# Patient Record
Sex: Male | Born: 1950
Health system: Southern US, Community
[De-identification: ages and names within clinical notes are randomized; demographics above are authoritative.]

## PROBLEM LIST (undated history)

## (undated) DIAGNOSIS — L509 Urticaria, unspecified: Secondary | ICD-10-CM

## (undated) DIAGNOSIS — Z7901 Long term (current) use of anticoagulants: Secondary | ICD-10-CM

## (undated) DIAGNOSIS — Q2112 Patent foramen ovale: Secondary | ICD-10-CM

## (undated) DIAGNOSIS — G459 Transient cerebral ischemic attack, unspecified: Secondary | ICD-10-CM

## (undated) DIAGNOSIS — H539 Unspecified visual disturbance: Secondary | ICD-10-CM

## (undated) DIAGNOSIS — Q211 Atrial septal defect: Secondary | ICD-10-CM

## (undated) DIAGNOSIS — E7800 Pure hypercholesterolemia, unspecified: Secondary | ICD-10-CM

## (undated) DIAGNOSIS — I639 Cerebral infarction, unspecified: Secondary | ICD-10-CM

## (undated) DIAGNOSIS — T783XXA Angioneurotic edema, initial encounter: Secondary | ICD-10-CM

## (undated) DIAGNOSIS — S82302M Unspecified fracture of lower end of left tibia, subsequent encounter for open fracture type I or II with nonunion: Secondary | ICD-10-CM

## (undated) DIAGNOSIS — S43005A Unspecified dislocation of left shoulder joint, initial encounter: Secondary | ICD-10-CM

## (undated) DIAGNOSIS — G510 Bell's palsy: Secondary | ICD-10-CM

## (undated) HISTORY — PX: OTHER SURGICAL HISTORY: SHX169

## (undated) HISTORY — DX: Angioneurotic edema, initial encounter: T78.3XXA

## (undated) HISTORY — DX: Urticaria, unspecified: L50.9

## (undated) HISTORY — PX: FRACTURE SURGERY: SHX138

## (undated) HISTORY — DX: Unspecified visual disturbance: H53.9

---

## 2011-03-02 NOTE — H&P (Signed)
NAME:  Justin [name], Justin [name]      ACCOUNT NO.:  [id number]   MEDICAL RECORD NO.:  [id number]          PATIENT TYPE:  AMB   LOCATION:  DAY                           FACILITY:  [facility]   PHYSICIAN:  Dalia Heading, M.D.  DATE OF BIRTH:  Jan 26, 1951   DATE OF ADMISSION:  DATE OF DISCHARGE:  LH                              HISTORY & PHYSICAL   CHIEF COMPLAINT:  Need for screening colonoscopy.   HISTORY OF PRESENT ILLNESS:  Patient is a 60 year old white male who is  referred for endoscopic evaluation.  He needs colonoscopy for screening  purposes.  No abdominal pain, weight loss, nausea, vomiting, diarrhea,  constipation, melena, or hematochezia have been noted.  He has never had  a colonoscopy.  There is no family history of colon carcinoma.   PAST MEDICAL HISTORY:  Unremarkable.   PAST SURGICAL HISTORY:  Unremarkable.   CURRENT MEDICATIONS:  None.   ALLERGIES:  NO KNOWN DRUG ALLERGIES.   REVIEW OF SYSTEMS:  Noncontributory.   PHYSICAL EXAMINATION:  Patient is a well-developed, well-nourished white  male in no acute distress.  LUNGS:  Clear to auscultation with equal breath sounds bilaterally.  HEART:  Reveals a regular rate and rhythm without S3, S4, or murmurs.  ABDOMEN:  Soft, nontender, and nondistended.  No hepatosplenomegaly or  masses are noted.  RECTAL:  Deferred to the procedure.   IMPRESSION:  Need for screening colonoscopy.   PLAN:  The patient is scheduled for a colonoscopy on September 02, 2007.  The risks and benefits of the procedure including bleeding and  perforation were fully explained to the patient, gave informed consent.      Dalia Heading, M.D.  Electronically Signed     MAJ/MEDQ  D:  08/13/2007  T:  08/14/2007  Job:  [id number]   cc:   Dalia Heading, M.D.  Fax: [phone]   Patrica Duel, M.D.  Fax: [phone]

## 2011-03-02 NOTE — H&P (Signed)
NAME:  Justin [name], Justin [name]      ACCOUNT NO.:  [id number]   MEDICAL RECORD NO.:  [id number]         PATIENT TYPE:  AMB   LOCATION:  DAY                           FACILITY:  [facility]   PHYSICIAN:  Dalia Heading, M.D.  DATE OF BIRTH:  01/15/1951   DATE OF ADMISSION:  DATE OF DISCHARGE:  LH                              HISTORY & PHYSICAL   CHIEF COMPLAINT:  Need for screening colonoscopy.   HISTORY OF PRESENT ILLNESS:  Patient is a 60 year old white male who is  referred for endoscopic evaluation.  He needs colonoscopy for screening  purposes.  No abdominal pain, weight loss, nausea, vomiting, diarrhea,  constipation, melena, or hematochezia have been noted.  He has never had  a colonoscopy.  There is no family history of colon carcinoma.   PAST MEDICAL HISTORY:  Unremarkable.   PAST SURGICAL HISTORY:  Unremarkable.   CURRENT MEDICATIONS:  None.   ALLERGIES:  NO KNOWN DRUG ALLERGIES.   REVIEW OF SYSTEMS:  Noncontributory.   PHYSICAL EXAMINATION:  Patient is a well-developed, well-nourished white  male in no acute distress.  LUNGS:  Clear to auscultation with equal breath sounds bilaterally.  HEART:  Reveals a regular rate and rhythm without S3, S4, or murmurs.  ABDOMEN:  Soft, nontender, and nondistended.  No hepatosplenomegaly or  masses are noted.  RECTAL:  Deferred to the procedure.   IMPRESSION:  Need for screening colonoscopy.   PLAN:  The patient is scheduled for a colonoscopy on September 02, 2007.  The risks and benefits of the procedure including bleeding and  perforation were fully explained to the patient, gave informed consent.      Dalia Heading, M.D.  Electronically Signed     MAJ/MEDQ  D:  08/13/2007  T:  08/14/2007  Job:  [id number]   cc:   Dalia Heading, M.D.  Fax: [phone]   Patrica Duel, M.D.  Fax: [phone]

## 2012-10-15 DIAGNOSIS — I639 Cerebral infarction, unspecified: Secondary | ICD-10-CM

## 2012-10-15 HISTORY — DX: Cerebral infarction, unspecified: I63.9

## 2013-04-21 ENCOUNTER — Observation Stay (HOSPITAL_COMMUNITY): Payer: Managed Care, Other (non HMO)

## 2013-04-21 ENCOUNTER — Encounter (HOSPITAL_BASED_OUTPATIENT_CLINIC_OR_DEPARTMENT_OTHER): Payer: Self-pay | Admitting: *Deleted

## 2013-04-21 ENCOUNTER — Emergency Department (HOSPITAL_BASED_OUTPATIENT_CLINIC_OR_DEPARTMENT_OTHER): Payer: Managed Care, Other (non HMO)

## 2013-04-21 ENCOUNTER — Inpatient Hospital Stay (HOSPITAL_BASED_OUTPATIENT_CLINIC_OR_DEPARTMENT_OTHER)
Admission: EM | Admit: 2013-04-21 | Discharge: 2013-04-24 | DRG: 041 | Disposition: A | Discharge status: Home / Self Care | Payer: Managed Care, Other (non HMO) | Attending: Internal Medicine | Admitting: Internal Medicine

## 2013-04-21 DIAGNOSIS — R4701 Aphasia: Secondary | ICD-10-CM | POA: Diagnosis present

## 2013-04-21 DIAGNOSIS — G459 Transient cerebral ischemic attack, unspecified: Secondary | ICD-10-CM

## 2013-04-21 DIAGNOSIS — R29810 Facial weakness: Secondary | ICD-10-CM | POA: Diagnosis present

## 2013-04-21 DIAGNOSIS — G510 Bell's palsy: Secondary | ICD-10-CM

## 2013-04-21 DIAGNOSIS — Q2111 Secundum atrial septal defect: Secondary | ICD-10-CM

## 2013-04-21 DIAGNOSIS — Q211 Atrial septal defect: Secondary | ICD-10-CM

## 2013-04-21 DIAGNOSIS — I639 Cerebral infarction, unspecified: Secondary | ICD-10-CM

## 2013-04-21 DIAGNOSIS — I10 Essential (primary) hypertension: Secondary | ICD-10-CM | POA: Diagnosis present

## 2013-04-21 DIAGNOSIS — I6350 Cerebral infarction due to unspecified occlusion or stenosis of unspecified cerebral artery: Principal | ICD-10-CM | POA: Diagnosis present

## 2013-04-21 DIAGNOSIS — Z8673 Personal history of transient ischemic attack (TIA), and cerebral infarction without residual deficits: Secondary | ICD-10-CM | POA: Diagnosis present

## 2013-04-21 HISTORY — DX: Transient cerebral ischemic attack, unspecified: G45.9

## 2013-04-21 HISTORY — DX: Cerebral infarction, unspecified: I63.9

## 2013-04-21 HISTORY — DX: Bell's palsy: G51.0

## 2013-04-21 LAB — LIPID PANEL
Cholesterol: 177 mg/dL (ref 0–200)
HDL: 51 mg/dL (ref >39)
Triglycerides: 85 mg/dL (ref <150)
VLDL: 17 mg/dL (ref 0–40)

## 2013-04-21 LAB — URINALYSIS, ROUTINE W REFLEX MICROSCOPIC
Leukocytes, UA: NEGATIVE
Nitrite: NEGATIVE
Specific Gravity, Urine: 1.026 (ref 1.005–1.030)
pH: 6 (ref 5.0–8.0)

## 2013-04-21 LAB — CBC
HCT: 46.2 % (ref 39.0–52.0)
Hemoglobin: 15.8 g/dL (ref 13.0–17.0)
Hemoglobin: 14.9 g/dL (ref 13.0–17.0)
MCH: 29.4 pg (ref 26.0–34.0)
MCV: 85.8 fL (ref 78.0–100.0)
Platelets: 183 10*3/uL (ref 150–400)
RBC: 5.32 MIL/uL (ref 4.22–5.81)
RBC: 5.07 MIL/uL (ref 4.22–5.81)
RDW: 14.2 % (ref 11.5–15.5)
WBC: 8 10*3/uL (ref 4.0–10.5)
WBC: 10.1 10*3/uL (ref 4.0–10.5)

## 2013-04-21 LAB — RAPID URINE DRUG SCREEN, HOSP PERFORMED
Barbiturates: NOT DETECTED
Cocaine: NOT DETECTED

## 2013-04-21 LAB — DIFFERENTIAL
Basophils Absolute: 0 10*3/uL (ref 0.0–0.1)
Lymphocytes Relative: 27 % (ref 12–46)
Lymphs Abs: 2.1 10*3/uL (ref 0.7–4.0)
Monocytes Absolute: 0.6 10*3/uL (ref 0.1–1.0)
Monocytes Relative: 7 % (ref 3–12)
Neutro Abs: 5.2 10*3/uL (ref 1.7–7.7)

## 2013-04-21 LAB — COMPREHENSIVE METABOLIC PANEL
AST: 20 U/L (ref 0–37)
CO2: 24 mEq/L (ref 19–32)
Chloride: 104 mEq/L (ref 96–112)
Creatinine, Ser: 1 mg/dL (ref 0.50–1.35)
GFR calc non Af Amer: 80 mL/min — ABNORMAL LOW (ref >90)
Total Bilirubin: 0.5 mg/dL (ref 0.3–1.2)

## 2013-04-21 LAB — APTT: aPTT: 30 seconds (ref 24–37)

## 2013-04-21 LAB — GLUCOSE, CAPILLARY: Glucose-Capillary: 101 mg/dL — ABNORMAL HIGH (ref 70–99)

## 2013-04-21 LAB — TROPONIN I: Troponin I: <0.3 ng/mL (ref <0.30)

## 2013-04-21 MED ORDER — ASPIRIN 300 MG RE SUPP
300.0000 mg | Freq: Every day | RECTAL | Status: DC
  Filled 2013-04-21 (×4): qty 1

## 2013-04-21 MED ORDER — HYDRALAZINE HCL 20 MG/ML IJ SOLN: 10.0000 mg | Freq: Four times a day (QID) | INTRAMUSCULAR | Status: DC | PRN

## 2013-04-21 MED ORDER — ENOXAPARIN SODIUM 40 MG/0.4ML ~~LOC~~ SOLN
40.0000 mg | SUBCUTANEOUS | Status: DC
  Administered 2013-04-21 – 2013-04-23 (×3): 40 mg via SUBCUTANEOUS
  Filled 2013-04-21 (×4): qty 0.4

## 2013-04-21 MED ORDER — ASPIRIN 325 MG PO TABS
325.0000 mg | ORAL_TABLET | Freq: Every day | ORAL | Status: DC
  Administered 2013-04-21 – 2013-04-24 (×4): 325 mg via ORAL
  Filled 2013-04-21 (×4): qty 1

## 2013-04-21 MED ORDER — SENNOSIDES-DOCUSATE SODIUM 8.6-50 MG PO TABS
1.0000 | ORAL_TABLET | Freq: Every evening | ORAL | Status: DC | PRN
  Filled 2013-04-21: qty 1

## 2013-04-21 NOTE — ED Notes (Signed)
Pt. Speech is clear and concise with no reports of double vision and no c/o numbness or tingling in hands arms or head.  No c/o headache.

## 2013-04-21 NOTE — ED Notes (Addendum)
Report called to Morrie Sheldon RN at [facility]

## 2013-04-21 NOTE — ED Notes (Addendum)
Sudden onset of slurred speech while at work. Right sided face droop. Within a few minutes of assessment symptoms resolved. Hx of recent treatment for Bells Palsy with steroids.

## 2013-04-21 NOTE — H&P (Signed)
[facility] History and Physical  Laksh Hinners [place] WUJ:[id number] DOB: 1950/12/17 DOA: 04/21/2013  Referring physician:  PCP: No primary provider on file.   Chief Complaint: aphasia HPI:  62 year old male with a history of Bell's palsy, recently treated for a URI with prednisone and azithromycin, who presents to [facility] because of inability to speak lasting about 20 minutes. Prior to his episode the patient started experiencing numbness and tingling in his right arm. He denied any chest pain any shortness of breath. 1980s he had an episode of Bell's palsy, with an exacerbation 2 weeks ago. The patient is a nonsmoker, denies any prior history of CVA, denies any history of hypertension but is hypertensive today with systolic blood pressure the 170s.     Review of Systems: negative for the following  Constitutional: Denies fever, chills, diaphoresis, appetite change and fatigue.  HEENT: Denies photophobia, eye pain, redness, hearing loss, ear pain, congestion, sore throat, rhinorrhea, sneezing, mouth sores, trouble swallowing, neck pain, neck stiffness and tinnitus.  Respiratory: Denies SOB, DOE, cough, chest tightness, and wheezing.  Cardiovascular: Denies chest pain, palpitations and leg swelling.  Gastrointestinal: Denies nausea, vomiting, abdominal pain, diarrhea, constipation, blood in stool and abdominal distention.  Genitourinary: Denies dysuria, urgency, frequency, hematuria, flank pain and difficulty urinating.  Musculoskeletal: Denies myalgias, back pain, joint swelling, arthralgias and gait problem.  Skin: Denies pallor, rash and wound.  Neurological: Positive for facial asymmetry, speech difficulty, weakness and headaches. Negative for numbness Hematological: Denies adenopathy. Easy bruising, personal or family bleeding history  Psychiatric/Behavioral: Denies suicidal ideation, mood changes, confusion, nervousness, sleep disturbance and agitation       Past  Medical History  Diagnosis Date  . Bell's palsy      History reviewed. No pertinent past surgical history.    Social History:  reports that he has never smoked. He does not have any smokeless tobacco history on file. He reports that he does not drink alcohol or use illicit drugs.    No Known Allergies  No family history on file.   Prior to Admission medications   Not on File     Physical Exam: Filed Vitals:   04/21/13 1449 04/21/13 1500 04/21/13 1652  BP:  149/82 170/96  Pulse:  88 95  Temp:   98.2 F (36.8 C)  TempSrc:   Oral  Resp:  20 18  Height: 5\' 9"  (1.753 m)  5\' 9"  (1.753 m)  Weight: 83.915 kg (185 lb)  83.915 kg (185 lb)  SpO2:  100% 97%     Constitutional: Vital signs reviewed. Patient is a well-developed and well-nourished in no acute distress and cooperative with exam. Alert and oriented x3.  Head: Normocephalic and atraumatic  Ear: TM normal bilaterally  Mouth: no erythema or exudates, MMM  Eyes: PERRL, EOMI, conjunctivae normal, No scleral icterus.  Neck: Supple, Trachea midline normal ROM, No JVD, mass, thyromegaly, or carotid bruit present.  Cardiovascular: RRR, S1 normal, S2 normal, no MRG, pulses symmetric and intact bilaterally  Pulmonary/Chest: CTAB, no wheezes, rales, or rhonchi  Abdominal: Soft. Non-tender, non-distended, bowel sounds are normal, no masses, organomegaly, or guarding present.  GU: no CVA tenderness Musculoskeletal: No joint deformities, erythema, or stiffness, ROM full and no nontender Ext: no edema and no cyanosis, pulses palpable bilaterally (DP and PT)  Hematology: no cervical, inginal, or axillary adenopathy.  Neurological: A&O x3, Strenght is normal and symmetric bilaterally, cranial nerve II-XII are grossly intact, no focal motor deficit, sensory intact to light touch  bilaterally.  Skin: Warm, dry and intact. No rash, cyanosis, or clubbing.  Psychiatric: Normal mood and affect. speech and behavior is normal. Judgment and  thought content normal. Cognition and memory are normal.       Labs on Admission:    Basic Metabolic Panel:  Recent Labs Lab 04/21/13 1225  NA 142  K 3.8  CL 104  CO2 24  GLUCOSE 101*  BUN 15  CREATININE 1.00  CALCIUM 10.1   Liver Function Tests:  Recent Labs Lab 04/21/13 1225  AST 20  ALT 28  ALKPHOS 85  BILITOT 0.5  PROT 7.5  ALBUMIN 4.2   No results found for this basename: LIPASE, AMYLASE,  in the last 168 hours No results found for this basename: AMMONIA,  in the last 168 hours CBC:  Recent Labs Lab 04/21/13 1225  WBC 8.0  NEUTROABS 5.2  HGB 15.8  HCT 46.2  MCV 86.8  PLT 185   Cardiac Enzymes:  Recent Labs Lab 04/21/13 1225  TROPONINI <0.30    BNP (last 3 results) No results found for this basename: PROBNP,  in the last 8760 hours    CBG:  Recent Labs Lab 04/21/13 1225  GLUCAP 101*    Radiological Exams on Admission: Ct Head Wo Contrast  04/21/2013   *RADIOLOGY REPORT*  Clinical Data: Tingling right hand and difficulty with speech. Symptoms now resolved.  CT HEAD WITHOUT CONTRAST  Technique:  Contiguous axial images were obtained from the base of the skull through the vertex without contrast.  Comparison: None.  Findings: No intracranial hemorrhage.  No CT evidence of large acute infarct.  Subtle hypodensity within the posterior limb of the internal capsule slightly more notable on the left.  Small acute infarct not entirely excluded although this may be entirely within normal limits.  No intracranial mass lesion detected on this unenhanced exam.  No hydrocephalus.  Mastoid air cells, middle ear cavities and visualized sinuses are clear.  IMPRESSION: No intracranial hemorrhage.  No CT evidence of large acute infarct.  Subtle hypodensity within the posterior limb of the internal capsule slightly more notable on the left. This may represent a normal finding although a small acute infarct is not entirely excluded.  Results discussed with Dr.  Rubin Payor 04/21/2013 12:48 p.m.   Original Report Authenticated By: Lacy Duverney, M.D.    EKG: Independently reviewed.   Assessment/Plan   1. TIA Patient will be admitted for a full stroke workup MRI/MRA 2-D echo Hemoglobin A1c, lipid panel, Aspirin for antiplatelet therapy Patient does not take anything at home    Hypertension We'll start as needed hydralazine May need long-term anti-hypertensive medications    Code Status:   full Family Communication: bedside Disposition Plan: Observation, may discharge in the morning Time spent: 70 mins   [facility] Pager [phone]  If 7PM-7AM, please contact night-coverage [email or web address] Password [facility] 04/21/2013, 6:48 PM

## 2013-04-21 NOTE — ED Notes (Signed)
Pt. Is WNL neurologically at present time.

## 2013-04-21 NOTE — ED Notes (Signed)
Patient transported to CT 

## 2013-04-21 NOTE — ED Provider Notes (Signed)
History    CSN: [id number] Arrival date & time 04/21/13  1214  First MD Initiated Contact with Patient 04/21/13 1217     Chief Complaint  Patient presents with  . Cerebrovascular Accident   (Consider location/radiation/quality/duration/timing/severity/associated sxs/prior Treatment) Patient is a 62 y.o. male presenting with Acute Neurological Problem.  Cerebrovascular Accident Associated symptoms include headaches. Pertinent negatives include no chest pain, no abdominal pain and no shortness of breath.   patient presents with difficulty speaking and right-sided weakness with acute onset around 10 minutes prior to arrival. EMS was called. Patient reportedly was not able to speak and had only a few words come out. He also is having some difficulty walking and had some right-sided weakness. Upon arrival the symptoms were rapidly improving. During the exam he states that he was back to normal. He states that he does not talk previously. He has recently been treated for a left-sided Bell's palsy. He states he is a dull posterior headache. Past Medical History  Diagnosis Date  . Bell's palsy    History reviewed. No pertinent past surgical history. No family history on file. History  Substance Use Topics  . Smoking status: Never Smoker   . Smokeless tobacco: Not on file  . Alcohol Use: No    Review of Systems  Constitutional: Negative for activity change and appetite change.  HENT: Negative for neck stiffness.   Eyes: Negative for pain.  Respiratory: Negative for chest tightness and shortness of breath.   Cardiovascular: Negative for chest pain and leg swelling.  Gastrointestinal: Negative for nausea, vomiting, abdominal pain and diarrhea.  Genitourinary: Negative for flank pain.  Musculoskeletal: Negative for back pain.  Skin: Negative for rash.  Neurological: Positive for facial asymmetry, speech difficulty, weakness and headaches. Negative for numbness.  Psychiatric/Behavioral:  Negative for behavioral problems.    Allergies  Review of patient's allergies indicates no known allergies.  Home Medications  No current outpatient prescriptions on file. Ht 5\' 9"  (1.753 m)  Wt 185 lb (83.915 kg)  BMI 27.31 kg/m2 Physical Exam  Nursing note and vitals reviewed. Constitutional: He is oriented to person, place, and time. He appears well-developed and well-nourished.  HENT:  Head: Normocephalic and atraumatic.  Eyes: EOM are normal. Pupils are equal, round, and reactive to light.  Neck: Normal range of motion. Neck supple.  Cardiovascular: Normal rate, regular rhythm and normal heart sounds.   No murmur heard. Pulmonary/Chest: Effort normal and breath sounds normal.  Abdominal: Soft. Bowel sounds are normal. He exhibits no distension and no mass. There is no tenderness. There is no rebound and no guarding.  Musculoskeletal: Normal range of motion. He exhibits no edema.  Neurological: He is alert and oriented to person, place, and time. No cranial nerve deficit.  On initial exam patient had mild right-sided facial droop. He also is having some difficulty getting hours. During the exam he returned to a baseline of normal and had no neurologic deficits.  Skin: Skin is warm and dry.  Psychiatric: He has a normal mood and affect.    ED Course  Procedures (including critical care time) Labs Reviewed  COMPREHENSIVE METABOLIC PANEL - Abnormal; Notable for the following:    Glucose, Bld 101 (*)    GFR calc non Af Amer 80 (*)    All other components within normal limits  GLUCOSE, CAPILLARY - Abnormal; Notable for the following:    Glucose-Capillary 101 (*)    All other components within normal limits  ETHANOL  PROTIME-INR  APTT  CBC  DIFFERENTIAL  URINE RAPID DRUG SCREEN (HOSP PERFORMED)  URINALYSIS, ROUTINE W REFLEX MICROSCOPIC  TROPONIN I   Ct Head Wo Contrast  04/21/2013   *RADIOLOGY REPORT*  Clinical Data: Tingling right hand and difficulty with speech.  Symptoms now resolved.  CT HEAD WITHOUT CONTRAST  Technique:  Contiguous axial images were obtained from the base of the skull through the vertex without contrast.  Comparison: None.  Findings: No intracranial hemorrhage.  No CT evidence of large acute infarct.  Subtle hypodensity within the posterior limb of the internal capsule slightly more notable on the left.  Small acute infarct not entirely excluded although this may be entirely within normal limits.  No intracranial mass lesion detected on this unenhanced exam.  No hydrocephalus.  Mastoid air cells, middle ear cavities and visualized sinuses are clear.  IMPRESSION: No intracranial hemorrhage.  No CT evidence of large acute infarct.  Subtle hypodensity within the posterior limb of the internal capsule slightly more notable on the left. This may represent a normal finding although a small acute infarct is not entirely excluded.  Results discussed with Dr. Rubin Payor 04/21/2013 12:48 p.m.   Original Report Authenticated By: Lacy Duverney, M.D.   1. TIA (transient ischemic attack)     Date: 04/21/2013  Rate: 99  Rhythm: normal sinus rhythm  QRS Axis: normal  Intervals: normal  ST/T Wave abnormalities: nonspecific ST/T changes  Conduction Disutrbances:none  Narrative Interpretation:   Old EKG Reviewed: none available   MDM  Patient with acute onset of difficulty speaking and facial droop along with right-sided weakness. It has resolved since he arrived in the ED. Head CT is indeterminant. Liver is reassuring. He'll be transferred to [facility] for further evaluation treatment.   Juliet Rude. Rubin Payor, MD 04/21/13 1453

## 2013-04-22 DIAGNOSIS — I6350 Cerebral infarction due to unspecified occlusion or stenosis of unspecified cerebral artery: Principal | ICD-10-CM

## 2013-04-22 DIAGNOSIS — G510 Bell's palsy: Secondary | ICD-10-CM

## 2013-04-22 DIAGNOSIS — Z8673 Personal history of transient ischemic attack (TIA), and cerebral infarction without residual deficits: Secondary | ICD-10-CM | POA: Diagnosis present

## 2013-04-22 DIAGNOSIS — I369 Nonrheumatic tricuspid valve disorder, unspecified: Secondary | ICD-10-CM

## 2013-04-22 LAB — HEMOGLOBIN A1C
Hgb A1c MFr Bld: 5.6 % (ref <5.7)
Mean Plasma Glucose: 114 mg/dL (ref <117)

## 2013-04-22 LAB — LIPID PANEL
Cholesterol: 170 mg/dL (ref 0–200)
Total CHOL/HDL Ratio: 3.7 RATIO

## 2013-04-22 MED ORDER — SIMVASTATIN 10 MG PO TABS
10.0000 mg | ORAL_TABLET | Freq: Every day | ORAL | Status: DC
  Administered 2013-04-22 – 2013-04-24 (×3): 10 mg via ORAL
  Filled 2013-04-22 (×3): qty 1

## 2013-04-22 MED ORDER — ACETAMINOPHEN 325 MG PO TABS
650.0000 mg | ORAL_TABLET | ORAL | Status: DC | PRN
  Administered 2013-04-22 – 2013-04-24 (×2): 650 mg via ORAL
  Filled 2013-04-22 (×2): qty 2

## 2013-04-22 NOTE — Evaluation (Signed)
Occupational Therapy Evaluation Patient Details Name: Justin [name] MRN: [id number] DOB: 1951-09-08 Today's Date: 04/22/2013 Time: 4098-1191 OT Time Calculation (min): 15 min  OT Assessment / Plan / Recommendation History of present illness  Admitted s/p CVA. H/o Bell's palsy.    Clinical Impression   Pt admitted with RUE tingling and slurred speech. MRI positive for several foci of acute infarction measuring a centimeter or less in the left hemisphere along the margins of the left MCA territory.  Pt reports symptoms have resolved. Pt demo'd ADLs and functional mobility at Independent level. No further acute OT needs. Education completed. Will sign off.     OT Assessment  Patient does not need any further OT services    Follow Up Recommendations  No OT follow up    Barriers to Discharge      Equipment Recommendations  None recommended by OT    Recommendations for Other Services    Frequency       Precautions / Restrictions     Pertinent Vitals/Pain See vitals    ADL  Grooming: Performed;Wash/dry hands;Wash/dry face;Independent Where Assessed - Grooming: Unsupported standing Upper Body Bathing: Simulated;Independent Where Assessed - Upper Body Bathing: Unsupported sitting Lower Body Bathing: Simulated;Independent Where Assessed - Lower Body Bathing: Unsupported sit to stand Upper Body Dressing: Performed;Independent Where Assessed - Upper Body Dressing: Unsupported sit to stand Lower Body Dressing: Performed;Independent Where Assessed - Lower Body Dressing: Unsupported sit to stand Toilet Transfer: Simulated;Independent Toilet Transfer Method: Sit to Barista:  (bed) Tub/Shower Transfer: Simulated;Independent Tub/Shower Transfer Method: Science writer: Walk in Scientist, research (physical sciences) Used: Gait belt Transfers/Ambulation Related to ADLs: independent ADL Comments: Educated pt on stroke signs and symptoms, and pt verbalized  understanding. Pt reports RUE numbness/tingling has resolved and is now baselien. Has been using RUE for ADL tasks and for using phone.    OT Diagnosis:    OT Problem List:   OT Treatment Interventions:     OT Goals(Current goals can be found in the care plan section)    Visit Information  Last OT Received On: 04/22/13       Prior Functioning     Home Living Family/patient expects to be discharged to:: Private residence Living Arrangements: Spouse/significant other Available Help at Discharge: Family Type of Home: House Home Access: Stairs to enter Secretary/administrator of Steps: 2 Home Layout: One level Home Equipment: None  Lives With: Spouse Prior Function Level of Independence: Independent Communication Communication: No difficulties Dominant Hand: Right         Vision/Perception Vision - History Baseline Vision: Wears glasses all the time Patient Visual Report: No change from baseline   Cognition  Cognition Arousal/Alertness: Awake/alert Behavior During Therapy: WFL for tasks assessed/performed Overall Cognitive Status: Within Functional Limits for tasks assessed    Extremity/Trunk Assessment Upper Extremity Assessment Upper Extremity Assessment: Overall WFL for tasks assessed     Mobility Bed Mobility Bed Mobility: Supine to Sit;Sitting - Scoot to Edge of Bed Supine to Sit: 7: Independent Sitting - Scoot to Edge of Bed: 7: Independent Transfers Transfers: Sit to Stand;Stand to Sit Sit to Stand: 7: Independent;From bed Stand to Sit: 7: Independent;To bed     Exercise     Balance Balance Balance Assessed: Yes Static Standing Balance Static Standing - Balance Support: No upper extremity supported;During functional activity Static Standing - Level of Assistance: 7: Independent Dynamic Standing Balance Dynamic Standing - Balance Support: No upper extremity supported;During functional activity Dynamic Standing - Level  of Assistance: 7:  Independent Dynamic Standing - Balance Activities: Lateral lean/weight shifting;Forward lean/weight shifting;Reaching for objects   End of Session OT - End of Session Equipment Utilized During Treatment: Gait belt Activity Tolerance: Patient tolerated treatment well Patient left: in bed;with call bell/phone within reach Nurse Communication: Mobility status  GO   04/22/2013 Cipriano Mile OTR/L Pager [phone] Office [phone]   Cipriano Mile 04/22/2013, 3:57 PM

## 2013-04-22 NOTE — Consult Note (Signed)
Referring Physician: Blake Divine    Chief Complaint: stroke  HPI:                                                                                                                                         Justin [name] is an 62 y.o. male with history of recurrent Bell's palsy on the left. Patient was at work yesterday at 35 PM when he noted his right arm was numb and he could not express himself.  He was making sounds but could not mae intelligible conversation. This lasted for about 20 minutes and fully resolved. HE was brought to the hospital due to fear he may have had a stroke.  He was on no medications prior to hospitalization. Initial CT head was negative but follow up MRI showed Currently he is back to his baseline. Several foci of acute infarction measuring a centimeter or less in the left hemisphere along the margins of the left MCA territory.  MRA showed normal large and medium size vessels.    Date last known well: 7.8.14 Time last known well: 12:00 tPA Given: No: out of the window for treatment MRS: 0  Past Medical History  Diagnosis Date  . Bell's palsy 1980's; [id number]; 2000's; 12/2012; 03/2013    "multiple times" (04/21/2013)  . TIA (transient ischemic attack) 04/21/2013    Past Surgical History  Procedure Laterality Date  . No past surgeries      History reviewed. No pertinent family history. Social History:  reports that he has never smoked. He has never used smokeless tobacco. He reports that  drinks alcohol. He reports that he does not use illicit drugs.  Allergies: No Known Allergies  Medications:                                                                                                                           Scheduled: . aspirin  300 mg Rectal Daily   Or  . aspirin  325 mg Oral Daily  . enoxaparin (LOVENOX) injection  40 mg Subcutaneous Q24H    ROS:  History obtained from the patient  General ROS: negative for - chills, fatigue, fever, night sweats, weight gain or weight loss Psychological ROS: negative for - behavioral disorder, hallucinations, memory difficulties, mood swings or suicidal ideation Ophthalmic ROS: negative for - blurry vision, double vision, eye pain or loss of vision ENT ROS: negative for - epistaxis, nasal discharge, oral lesions, sore throat, tinnitus or vertigo Allergy and Immunology ROS: negative for - hives or itchy/watery eyes Hematological and Lymphatic ROS: negative for - bleeding problems, bruising or swollen lymph nodes Endocrine ROS: negative for - galactorrhea, hair pattern changes, polydipsia/polyuria or temperature intolerance Respiratory ROS: negative for - cough, hemoptysis, shortness of breath or wheezing Cardiovascular ROS: negative for - chest pain, dyspnea on exertion, edema or irregular heartbeat Gastrointestinal ROS: negative for - abdominal pain, diarrhea, hematemesis, nausea/vomiting or stool incontinence Genito-Urinary ROS: negative for - dysuria, hematuria, incontinence or urinary frequency/urgency Musculoskeletal ROS: negative for - joint swelling or muscular weakness Neurological ROS: as noted in HPI Dermatological ROS: negative for rash and skin lesion changes  Neurologic Examination:                                                                                                      Blood pressure 137/86, pulse 76, temperature 98.2 F (36.8 C), temperature source Oral, resp. rate 19, height 5\' 9"  (1.753 m), weight 83.915 kg (185 lb), SpO2 99.00%.  Mental Status: Alert, oriented, thought content appropriate.  Speech fluent without evidence of aphasia.  Able to follow 3 step commands without difficulty. Cranial Nerves: II: Discs flat bilaterally; Visual fields grossly normal, pupils equal, round, reactive to light and accommodation III,IV, VI: ptosis not present,  extra-ocular motions intact bilaterally V,VII: smile symmetric, slight NL fold decrease on the left (bell's residual), facial light touch sensation normal bilaterally VIII: hearing normal bilaterally IX,X: gag reflex present XI: bilateral shoulder shrug XII: midline tongue extension Motor: Right : Upper extremity   5/5    Left:     Upper extremity   5/5  Lower extremity   5/5     Lower extremity   5/5 Tone and bulk:normal tone throughout; no atrophy noted Sensory: Pinprick and light touch intact throughout, bilaterally Deep Tendon Reflexes:  Right: Upper Extremity   Left: Upper extremity   biceps (C-5 to C-6) 2/4   biceps (C-5 to C-6) 2/4 tricep (C7) 2/4    triceps (C7) 2/4 Brachioradialis (C6) 2/4  Brachioradialis (C6) 2/4  Lower Extremity Lower Extremity  quadriceps (L-2 to L-4) 2/4   quadriceps (L-2 to L-4) 2/4 Achilles (S1) 2/4   Achilles (S1) 2/4  Plantars: Right: downgoing   Left: downgoing Cerebellar: normal finger-to-nose,  normal heel-to-shin test CV: pulses palpable throughout    Results for orders placed during the hospital encounter of 04/21/13 (from the past 48 hour(s))  ETHANOL     Status: None   Collection Time    04/21/13 12:25 PM      Result Value Range   Alcohol, Ethyl (B) <11  0 - 11 mg/dL   Comment:  LOWEST DETECTABLE LIMIT FOR     SERUM ALCOHOL IS 11 mg/dL     FOR MEDICAL PURPOSES ONLY  PROTIME-INR     Status: None   Collection Time    04/21/13 12:25 PM      Result Value Range   Prothrombin Time 12.7  11.6 - 15.2 seconds   INR 0.97  0.00 - 1.49  APTT     Status: None   Collection Time    04/21/13 12:25 PM      Result Value Range   aPTT 30  24 - 37 seconds  CBC     Status: None   Collection Time    04/21/13 12:25 PM      Result Value Range   WBC 8.0  4.0 - 10.5 K/uL   RBC 5.32  4.22 - 5.81 MIL/uL   Hemoglobin 15.8  13.0 - 17.0 g/dL   HCT 16.1  09.6 - 04.5 %   MCV 86.8  78.0 - 100.0 fL   MCH 29.7  26.0 - 34.0 pg   MCHC 34.2   30.0 - 36.0 g/dL   RDW 40.9  81.1 - 91.4 %   Platelets 185  150 - 400 K/uL  DIFFERENTIAL     Status: None   Collection Time    04/21/13 12:25 PM      Result Value Range   Neutrophils Relative % 65  43 - 77 %   Neutro Abs 5.2  1.7 - 7.7 K/uL   Lymphocytes Relative 27  12 - 46 %   Lymphs Abs 2.1  0.7 - 4.0 K/uL   Monocytes Relative 7  3 - 12 %   Monocytes Absolute 0.6  0.1 - 1.0 K/uL   Eosinophils Relative 1  0 - 5 %   Eosinophils Absolute 0.0  0.0 - 0.7 K/uL   Basophils Relative 1  0 - 1 %   Basophils Absolute 0.0  0.0 - 0.1 K/uL  COMPREHENSIVE METABOLIC PANEL     Status: Abnormal   Collection Time    04/21/13 12:25 PM      Result Value Range   Sodium 142  135 - 145 mEq/L   Potassium 3.8  3.5 - 5.1 mEq/L   Chloride 104  96 - 112 mEq/L   CO2 24  19 - 32 mEq/L   Glucose, Bld 101 (*) 70 - 99 mg/dL   BUN 15  6 - 23 mg/dL   Creatinine, Ser 7.82  0.50 - 1.35 mg/dL   Calcium 95.6  8.4 - 21.3 mg/dL   Total Protein 7.5  6.0 - 8.3 g/dL   Albumin 4.2  3.5 - 5.2 g/dL   AST 20  0 - 37 U/L   ALT 28  0 - 53 U/L   Alkaline Phosphatase 85  39 - 117 U/L   Total Bilirubin 0.5  0.3 - 1.2 mg/dL   GFR calc non Af Amer 80 (*) >90 mL/min   GFR calc Af Amer >90  >90 mL/min   Comment:            The eGFR has been calculated     using the CKD EPI equation.     This calculation has not been     validated in all clinical     situations.     eGFR's persistently     <90 mL/min signify     possible Chronic Kidney Disease.  TROPONIN I     Status: None   Collection Time  04/21/13 12:25 PM      Result Value Range   Troponin I <0.30  <0.30 ng/mL   Comment:            Due to the release kinetics of cTnI,     a negative result within the first hours     of the onset of symptoms does not rule out     myocardial infarction with certainty.     If myocardial infarction is still suspected,     repeat the test at appropriate intervals.  GLUCOSE, CAPILLARY     Status: Abnormal   Collection Time     04/21/13 12:25 PM      Result Value Range   Glucose-Capillary 101 (*) 70 - 99 mg/dL   Comment 1 Notify RN    URINE RAPID DRUG SCREEN (HOSP PERFORMED)     Status: None   Collection Time    04/21/13  2:02 PM      Result Value Range   Opiates NONE DETECTED  NONE DETECTED   Cocaine NONE DETECTED  NONE DETECTED   Benzodiazepines NONE DETECTED  NONE DETECTED   Amphetamines NONE DETECTED  NONE DETECTED   Tetrahydrocannabinol NONE DETECTED  NONE DETECTED   Barbiturates NONE DETECTED  NONE DETECTED   Comment:            DRUG SCREEN FOR MEDICAL PURPOSES     ONLY.  IF CONFIRMATION IS NEEDED     FOR ANY PURPOSE, NOTIFY LAB     WITHIN 5 DAYS.                LOWEST DETECTABLE LIMITS     FOR URINE DRUG SCREEN     Drug Class       Cutoff (ng/mL)     Amphetamine      1000     Barbiturate      200     Benzodiazepine   200     Tricyclics       300     Opiates          300     Cocaine          300     THC              50  URINALYSIS, ROUTINE W REFLEX MICROSCOPIC     Status: None   Collection Time    04/21/13  2:02 PM      Result Value Range   Color, Urine YELLOW  YELLOW   APPearance CLEAR  CLEAR   Specific Gravity, Urine 1.026  1.005 - 1.030   pH 6.0  5.0 - 8.0   Glucose, UA NEGATIVE  NEGATIVE mg/dL   Hgb urine dipstick NEGATIVE  NEGATIVE   Bilirubin Urine NEGATIVE  NEGATIVE   Ketones, ur NEGATIVE  NEGATIVE mg/dL   Protein, ur NEGATIVE  NEGATIVE mg/dL   Urobilinogen, UA 0.2  0.0 - 1.0 mg/dL   Nitrite NEGATIVE  NEGATIVE   Leukocytes, UA NEGATIVE  NEGATIVE   Comment: MICROSCOPIC NOT DONE ON URINES WITH NEGATIVE PROTEIN, BLOOD, LEUKOCYTES, NITRITE, OR GLUCOSE <1000 mg/dL.  HEMOGLOBIN A1C     Status: None   Collection Time    04/21/13  7:30 PM      Result Value Range   Hemoglobin A1C 5.5  <5.7 %   Comment: (NOTE)  According to the ADA Clinical Practice Recommendations for 2011, when     HbA1c is used as a  screening test:      >=6.5%   Diagnostic of Diabetes Mellitus               (if abnormal result is confirmed)     5.7-6.4%   Increased risk of developing Diabetes Mellitus     References:Diagnosis and Classification of Diabetes Mellitus,Diabetes     Care,2011,34(Suppl 1):S62-S69 and Standards of Medical Care in             Diabetes - 2011,Diabetes Care,2011,34 (Suppl 1):S11-S61.   Mean Plasma Glucose 111  <117 mg/dL  LIPID PANEL     Status: Abnormal   Collection Time    04/21/13  7:30 PM      Result Value Range   Cholesterol 177  0 - 200 mg/dL   Triglycerides 85  <161 mg/dL   HDL 51  >09 mg/dL   Total CHOL/HDL Ratio 3.5     VLDL 17  0 - 40 mg/dL   LDL Cholesterol 604 (*) 0 - 99 mg/dL   Comment:            Total Cholesterol/HDL:CHD Risk     Coronary Heart Disease Risk Table                         Men   Women      1/2 Average Risk   3.4   3.3      Average Risk       5.0   4.4      2 X Average Risk   9.6   7.1      3 X Average Risk  23.4   11.0                Use the calculated Patient Ratio     above and the CHD Risk Table     to determine the patient's CHD Risk.                ATP III CLASSIFICATION (LDL):      <100     mg/dL   Optimal      540-981  mg/dL   Near or Above                        Optimal      130-159  mg/dL   Borderline      191-478  mg/dL   High      >295     mg/dL   Very High  CBC     Status: None   Collection Time    04/21/13  7:45 PM      Result Value Range   WBC 10.1  4.0 - 10.5 K/uL   RBC 5.07  4.22 - 5.81 MIL/uL   Hemoglobin 14.9  13.0 - 17.0 g/dL   HCT 62.1  30.8 - 65.7 %   MCV 85.8  78.0 - 100.0 fL   MCH 29.4  26.0 - 34.0 pg   MCHC 34.3  30.0 - 36.0 g/dL   RDW 84.6  96.2 - 95.2 %   Platelets 183  150 - 400 K/uL  LIPID PANEL     Status: Abnormal   Collection Time    04/22/13  4:45 AM      Result Value Range   Cholesterol 170  0 - 200 mg/dL  Triglycerides 107  <150 mg/dL   HDL 46  >96 mg/dL   Total CHOL/HDL Ratio 3.7     VLDL 21  0 - 40  mg/dL   LDL Cholesterol 045 (*) 0 - 99 mg/dL   Comment:            Total Cholesterol/HDL:CHD Risk     Coronary Heart Disease Risk Table                         Men   Women      1/2 Average Risk   3.4   3.3      Average Risk       5.0   4.4      2 X Average Risk   9.6   7.1      3 X Average Risk  23.4   11.0                Use the calculated Patient Ratio     above and the CHD Risk Table     to determine the patient's CHD Risk.                ATP III CLASSIFICATION (LDL):      <100     mg/dL   Optimal      409-811  mg/dL   Near or Above                        Optimal      130-159  mg/dL   Borderline      914-782  mg/dL   High      >956     mg/dL   Very High   Dg Chest 2 View  04/21/2013   *RADIOLOGY REPORT*  Clinical Data: Stroke  CHEST - 2 VIEW  Comparison: None  Findings: Heart size is normal.  No pleural effusion or edema.  No airspace consolidation identified.  Review of the visualized osseous structures is unremarkable.  IMPRESSION:  1.  No acute cardiopulmonary abnormalities.   Original Report Authenticated By: Signa Kell, M.D.   Ct Head Wo Contrast  04/21/2013   *RADIOLOGY REPORT*  Clinical Data: Tingling right hand and difficulty with speech. Symptoms now resolved.  CT HEAD WITHOUT CONTRAST  Technique:  Contiguous axial images were obtained from the base of the skull through the vertex without contrast.  Comparison: None.  Findings: No intracranial hemorrhage.  No CT evidence of large acute infarct.  Subtle hypodensity within the posterior limb of the internal capsule slightly more notable on the left.  Small acute infarct not entirely excluded although this may be entirely within normal limits.  No intracranial mass lesion detected on this unenhanced exam.  No hydrocephalus.  Mastoid air cells, middle ear cavities and visualized sinuses are clear.  IMPRESSION: No intracranial hemorrhage.  No CT evidence of large acute infarct.  Subtle hypodensity within the posterior limb of the  internal capsule slightly more notable on the left. This may represent a normal finding although a small acute infarct is not entirely excluded.  Results discussed with Dr. Rubin Payor 04/21/2013 12:48 p.m.   Original Report Authenticated By: Lacy Duverney, M.D.   Mr Brain Wo Contrast  04/22/2013   *RADIOLOGY REPORT*  Clinical Data:  Tingling of the right hand.  Speech disturbance.  MRI HEAD WITHOUT CONTRAST MRA HEAD WITHOUT CONTRAST  Technique:  Multiplanar, multiecho pulse sequences of the brain and surrounding  structures were obtained without intravenous contrast. Angiographic images of the head were obtained using MRA technique without contrast.  Comparison:  Head CT same day  MRI HEAD  Findings:  There are four or five sub-centimeter acute infarctions in the left hemisphere in the periphery of the brain at the parietal occipital junction and in the parietal region at the vertex.  These could be of micro embolic infarctions or could be watershed infarctions along the margins of the left MCA territory. No other acute infarction.  The brainstem and cerebellum are normal.  The cerebral hemispheres are otherwise normal without evidence of other old or acute infarction, mass lesion, hemorrhage, hydrocephalus or extra-axial collection.  No pituitary mass.  No inflammatory sinus disease.  No skull or skull base lesion.  IMPRESSION: Several foci of acute infarction measuring a centimeter or less in the left hemisphere along the margins of the left MCA territory. These could be due to micro embolic infarctions or could be watershed infarctions.  No evidence of swelling or hemorrhage.  The brain appears otherwise normal.  MRA HEAD  Findings: Both internal carotid arteries are widely patent into the brain.  The anterior and middle cerebral vessels are normal without proximal stenosis, aneurysm or vascular malformation.  Both vertebral arteries are patent with the left being dominant.  No basilar stenosis.  Posterior  circulation branch vessels are normal.  IMPRESSION: Normal intracranial MR angiography of the large and medium-sized vessels.  This appearance raises the likelihood of micro embolic infarctions over watershed infarctions.  Evaluation of the more proximal vasculature is suggested.   Original Report Authenticated By: Paulina Fusi, M.D.   Mr Mra Head/brain Wo Cm  04/22/2013   *RADIOLOGY REPORT*  Clinical Data:  Tingling of the right hand.  Speech disturbance.  MRI HEAD WITHOUT CONTRAST MRA HEAD WITHOUT CONTRAST  Technique:  Multiplanar, multiecho pulse sequences of the brain and surrounding structures were obtained without intravenous contrast. Angiographic images of the head were obtained using MRA technique without contrast.  Comparison:  Head CT same day  MRI HEAD  Findings:  There are four or five sub-centimeter acute infarctions in the left hemisphere in the periphery of the brain at the parietal occipital junction and in the parietal region at the vertex.  These could be of micro embolic infarctions or could be watershed infarctions along the margins of the left MCA territory. No other acute infarction.  The brainstem and cerebellum are normal.  The cerebral hemispheres are otherwise normal without evidence of other old or acute infarction, mass lesion, hemorrhage, hydrocephalus or extra-axial collection.  No pituitary mass.  No inflammatory sinus disease.  No skull or skull base lesion.  IMPRESSION: Several foci of acute infarction measuring a centimeter or less in the left hemisphere along the margins of the left MCA territory. These could be due to micro embolic infarctions or could be watershed infarctions.  No evidence of swelling or hemorrhage.  The brain appears otherwise normal.  MRA HEAD  Findings: Both internal carotid arteries are widely patent into the brain.  The anterior and middle cerebral vessels are normal without proximal stenosis, aneurysm or vascular malformation.  Both vertebral arteries are  patent with the left being dominant.  No basilar stenosis.  Posterior circulation branch vessels are normal.  IMPRESSION: Normal intracranial MR angiography of the large and medium-sized vessels.  This appearance raises the likelihood of micro embolic infarctions over watershed infarctions.  Evaluation of the more proximal vasculature is suggested.   Original Report Authenticated  By: Paulina Fusi, M.D.   Vascular Ultrasound  Carotid Duplex (Doppler) has been completed.  There is no obvious evidence of hemodynamically significant internal carotid artery stenosis >40%. Vertebral arteries are patent with antegrade flow.   Echo--pending   Assessment and plan discussed with with attending physician and they are in agreement.    Felicie Morn PA-C [facility] [phone]  04/22/2013, 11:54 AM  I have seen and evaluated the patient. I have reviewed the above note and made appropriate changes.    Assessment: 62 y.o. male with several acute left MCA infarcts. I suspect a larger embolic occlusion that then broke up and continued distally.  Patient currently showing no symptoms.  Thus far A1c 5.5, LDL 108, Dopplers showing no stenosis >40%.  2 D echo pending. If Echo is normal would benefit from TEE. Patient has been placed on ASA and would benefit from low dose statin.   Stroke Risk Factors - none  Plan: 1. Agree with starting statin therapy 2. Echocardiogram, if negative may need TEE.  3. Prophylactic therapy-Antiplatelet med: Aspirin - dose 325 mg daily 4. Risk factor modification 5. Telemetry monitoring 6. Frequent neuro checks   Ritta Slot, MD [facility] [phone]  If 7pm- 7am, please page neurology on call at [phone].

## 2013-04-22 NOTE — Evaluation (Signed)
Physical Therapy Evaluation Patient Details Name: Justin [name] MRN: [id number] DOB: [birth year] Today's Date: 04/22/2013 Time: 9629-5284 PT Time Calculation (min): 15 min  PT Assessment / Plan / Recommendation History of Present Illness  Admitted s/p CVA. H/o Bell's palsy  Clinical Impression  Pt admitted with RUE tingling and slurred speech. MRI positive for several foci of acute infarction measuring a centimeter or less in the left hemisphere along the margins of the left MCA territory. Pt reports symptoms have resolved. Pt independent with mobility and standardized balance assessment. No acute PT needs. PT will discharge, patient in agreement.     PT Assessment  Patent does not need any further PT services                         Pertinent Vitals/Pain NAD      Mobility  Bed Mobility Bed Mobility: Supine to Sit;Sitting - Scoot to Edge of Bed Supine to Sit: 7: Independent Sitting - Scoot to Edge of Bed: 7: Independent Transfers Transfers: Sit to Stand;Stand to Sit Sit to Stand: 7: Independent;From bed Stand to Sit: 7: Independent;To bed Ambulation/Gait Ambulation/Gait Assistance: 7: Independent Ambulation Distance (Feet): 200 Feet Assistive device: None Ambulation/Gait Assistance Details: WFL Gait Pattern: Within Functional Limits Gait velocity: WFL General Gait Details: WFL Modified Rankin (Stroke Patients Only) Pre-Morbid Rankin Score: No symptoms Modified Rankin: No symptoms      Visit Information  Last PT Received On: 04/22/13 Assistance Needed: +1 History of Present Illness: Admitted s/p CVA. H/o Bell's palsy       Prior Functioning  Home Living Family/patient expects to be discharged to:: Private residence Living Arrangements: Spouse/significant other Available Help at Discharge: Family Type of Home: House Home Access: Stairs to enter Secretary/administrator of Steps: 2 Home Layout: One level Home Equipment: None  Lives With: Spouse Prior  Function Level of Independence: Independent Communication Communication: No difficulties Dominant Hand: Right    Cognition  Cognition Arousal/Alertness: Awake/alert Behavior During Therapy: WFL for tasks assessed/performed Overall Cognitive Status: Within Functional Limits for tasks assessed    Extremity/Trunk Assessment Upper Extremity Assessment Upper Extremity Assessment: Overall WFL for tasks assessed   Balance Balance Balance Assessed: Yes Static Standing Balance Static Standing - Balance Support: No upper extremity supported;During functional activity Static Standing - Level of Assistance: 7: Independent Dynamic Standing Balance Dynamic Standing - Balance Support: No upper extremity supported;During functional activity Dynamic Standing - Level of Assistance: 7: Independent Dynamic Standing - Balance Activities: Lateral lean/weight shifting;Forward lean/weight shifting;Reaching for objects Dynamic Gait Index Level Surface: Normal Change in Gait Speed: Normal Gait with Horizontal Head Turns: Normal Gait with Vertical Head Turns: Normal Gait and Pivot Turn: Normal Step Over Obstacle: Normal Step Around Obstacles: Normal Steps: Normal Total Score: 24  End of Session PT - End of Session Equipment Utilized During Treatment: Gait belt Activity Tolerance: Patient tolerated treatment well Patient left: in bed Nurse Communication: Mobility status  GP     Fabio Asa 04/22/2013, 4:13 PM Charlotte Crumb, PT DPT  [phone]

## 2013-04-22 NOTE — Progress Notes (Signed)
*  PRELIMINARY RESULTS* Vascular Ultrasound Carotid Duplex (Doppler) has been completed. There is no obvious evidence of hemodynamically significant internal carotid artery stenosis >40%. Vertebral arteries are patent with antegrade flow.  04/22/2013 10:55 AM Gertie Fey, RVT, RDCS, RDMS

## 2013-04-22 NOTE — Progress Notes (Signed)
[facility] PROGRESS NOTE  Justin [name] YNW:[id number] DOB: January 12, 1951 DOA: 04/21/2013 PCP: No PCP Per Patient Brief HPI: 62 year old male with a history of Bell's palsy, recently treated for a URI with prednisone and azithromycin, who presents to [facility] because of inability to speak lasting about 20 minutes. Prior to his episode the patient started experiencing numbness and tingling in his right arm. He denied any chest pain any shortness of breath. 1980s he had an episode of Bell's palsy, with an exacerbation 2 weeks ago. The patient is a nonsmoker, denies any prior history of CVA, . He was admitted for evaluation of CVA.   Assessment/Plan: 1. Acute CVA:  - ASPIRIN 325 mg daily.  carotid dulplex did not show significant stenosis. Echo within normal limits. LDL is 109, hgba1c is 5.6.  2. Bell's Palsy: stable.  3. DVT prophylaxis.   Code Status: full code Family Communication: none present.  Disposition Plan: pending.    Consultants:  NEUROLOGY  Procedures:  MRI BRAIN  MRA HEAD AND NECK.  Antibiotics:  none  HPI/Subjective: No new complaints wants to go home.   Objective: Filed Vitals:   04/22/13 0002 04/22/13 0400 04/22/13 0800 04/22/13 1230  BP: 121/80 117/68 137/86 131/86  Pulse: 70 63 76 81  Temp: 98.1 F (36.7 C) 97.7 F (36.5 C) 98.2 F (36.8 C) 98.8 F (37.1 C)  TempSrc: Oral Oral Oral Oral  Resp: 18 18 19 17   Height:      Weight:      SpO2: 97% 98% 99% 97%    Intake/Output Summary (Last 24 hours) at 04/22/13 1541 Last data filed at 04/22/13 0830  Gross per 24 hour  Intake    360 ml  Output    400 ml  Net    -40 ml   Filed Weights   04/21/13 1449 04/21/13 1652  Weight: 83.915 kg (185 lb) 83.915 kg (185 lb)    Exam:   General:  Alert afebrile comfortable  Cardiovascular: s1s2 RRR  Respiratory: ctab  Abdomen: soft NT ND BS+  Musculoskeletal: no pedal edema  Data Reviewed: Basic Metabolic Panel:  Recent  Labs Lab 04/21/13 1225  NA 142  K 3.8  CL 104  CO2 24  GLUCOSE 101*  BUN 15  CREATININE 1.00  CALCIUM 10.1   Liver Function Tests:  Recent Labs Lab 04/21/13 1225  AST 20  ALT 28  ALKPHOS 85  BILITOT 0.5  PROT 7.5  ALBUMIN 4.2   No results found for this basename: LIPASE, AMYLASE,  in the last 168 hours No results found for this basename: AMMONIA,  in the last 168 hours CBC:  Recent Labs Lab 04/21/13 1225 04/21/13 1945  WBC 8.0 10.1  NEUTROABS 5.2  --   HGB 15.8 14.9  HCT 46.2 43.5  MCV 86.8 85.8  PLT 185 183   Cardiac Enzymes:  Recent Labs Lab 04/21/13 1225  TROPONINI <0.30   BNP (last 3 results) No results found for this basename: PROBNP,  in the last 8760 hours CBG:  Recent Labs Lab 04/21/13 1225  GLUCAP 101*    No results found for this or any previous visit (from the past 240 hour(s)).   Studies: Dg Chest 2 View  04/21/2013   *RADIOLOGY REPORT*  Clinical Data: Stroke  CHEST - 2 VIEW  Comparison: None  Findings: Heart size is normal.  No pleural effusion or edema.  No airspace consolidation identified.  Review of the visualized osseous structures is unremarkable.  IMPRESSION:  1.  No acute cardiopulmonary abnormalities.   Original Report Authenticated By: Signa Kell, M.D.   Ct Head Wo Contrast  04/21/2013   *RADIOLOGY REPORT*  Clinical Data: Tingling right hand and difficulty with speech. Symptoms now resolved.  CT HEAD WITHOUT CONTRAST  Technique:  Contiguous axial images were obtained from the base of the skull through the vertex without contrast.  Comparison: None.  Findings: No intracranial hemorrhage.  No CT evidence of large acute infarct.  Subtle hypodensity within the posterior limb of the internal capsule slightly more notable on the left.  Small acute infarct not entirely excluded although this may be entirely within normal limits.  No intracranial mass lesion detected on this unenhanced exam.  No hydrocephalus.  Mastoid air cells, middle  ear cavities and visualized sinuses are clear.  IMPRESSION: No intracranial hemorrhage.  No CT evidence of large acute infarct.  Subtle hypodensity within the posterior limb of the internal capsule slightly more notable on the left. This may represent a normal finding although a small acute infarct is not entirely excluded.  Results discussed with Dr. Rubin Payor 04/21/2013 12:48 p.m.   Original Report Authenticated By: Lacy Duverney, M.D.   Mr Brain Wo Contrast  04/22/2013   *RADIOLOGY REPORT*  Clinical Data:  Tingling of the right hand.  Speech disturbance.  MRI HEAD WITHOUT CONTRAST MRA HEAD WITHOUT CONTRAST  Technique:  Multiplanar, multiecho pulse sequences of the brain and surrounding structures were obtained without intravenous contrast. Angiographic images of the head were obtained using MRA technique without contrast.  Comparison:  Head CT same day  MRI HEAD  Findings:  There are four or five sub-centimeter acute infarctions in the left hemisphere in the periphery of the brain at the parietal occipital junction and in the parietal region at the vertex.  These could be of micro embolic infarctions or could be watershed infarctions along the margins of the left MCA territory. No other acute infarction.  The brainstem and cerebellum are normal.  The cerebral hemispheres are otherwise normal without evidence of other old or acute infarction, mass lesion, hemorrhage, hydrocephalus or extra-axial collection.  No pituitary mass.  No inflammatory sinus disease.  No skull or skull base lesion.  IMPRESSION: Several foci of acute infarction measuring a centimeter or less in the left hemisphere along the margins of the left MCA territory. These could be due to micro embolic infarctions or could be watershed infarctions.  No evidence of swelling or hemorrhage.  The brain appears otherwise normal.  MRA HEAD  Findings: Both internal carotid arteries are widely patent into the brain.  The anterior and middle cerebral  vessels are normal without proximal stenosis, aneurysm or vascular malformation.  Both vertebral arteries are patent with the left being dominant.  No basilar stenosis.  Posterior circulation branch vessels are normal.  IMPRESSION: Normal intracranial MR angiography of the large and medium-sized vessels.  This appearance raises the likelihood of micro embolic infarctions over watershed infarctions.  Evaluation of the more proximal vasculature is suggested.   Original Report Authenticated By: Paulina Fusi, M.D.   Mr Mra Head/brain Wo Cm  04/22/2013   *RADIOLOGY REPORT*  Clinical Data:  Tingling of the right hand.  Speech disturbance.  MRI HEAD WITHOUT CONTRAST MRA HEAD WITHOUT CONTRAST  Technique:  Multiplanar, multiecho pulse sequences of the brain and surrounding structures were obtained without intravenous contrast. Angiographic images of the head were obtained using MRA technique without contrast.  Comparison:  Head CT same day  MRI  HEAD  Findings:  There are four or five sub-centimeter acute infarctions in the left hemisphere in the periphery of the brain at the parietal occipital junction and in the parietal region at the vertex.  These could be of micro embolic infarctions or could be watershed infarctions along the margins of the left MCA territory. No other acute infarction.  The brainstem and cerebellum are normal.  The cerebral hemispheres are otherwise normal without evidence of other old or acute infarction, mass lesion, hemorrhage, hydrocephalus or extra-axial collection.  No pituitary mass.  No inflammatory sinus disease.  No skull or skull base lesion.  IMPRESSION: Several foci of acute infarction measuring a centimeter or less in the left hemisphere along the margins of the left MCA territory. These could be due to micro embolic infarctions or could be watershed infarctions.  No evidence of swelling or hemorrhage.  The brain appears otherwise normal.  MRA HEAD  Findings: Both internal carotid  arteries are widely patent into the brain.  The anterior and middle cerebral vessels are normal without proximal stenosis, aneurysm or vascular malformation.  Both vertebral arteries are patent with the left being dominant.  No basilar stenosis.  Posterior circulation branch vessels are normal.  IMPRESSION: Normal intracranial MR angiography of the large and medium-sized vessels.  This appearance raises the likelihood of micro embolic infarctions over watershed infarctions.  Evaluation of the more proximal vasculature is suggested.   Original Report Authenticated By: Paulina Fusi, M.D.    Scheduled Meds: . aspirin  300 mg Rectal Daily   Or  . aspirin  325 mg Oral Daily  . enoxaparin (LOVENOX) injection  40 mg Subcutaneous Q24H   Continuous Infusions:   Active Problems:   * No active hospital problems. *    Time spent: 25 minutes    [facility]  [facility] Pager [phone]. If 7PM-7AM, please contact night-coverage at [email or web address], password [facility] 04/22/2013, 3:41 PM  LOS: 1 day

## 2013-04-22 NOTE — Progress Notes (Signed)
Echo Lab  2D Echocardiogram completed.  Minna Dumire L Abram Sax, RDCS 04/22/2013 9:35 AM

## 2013-04-22 NOTE — Care Management Note (Signed)
    Page 1 of 1   04/24/2013     4:55:53 PM   CARE MANAGEMENT NOTE 04/24/2013  Patient:  Justin [name], Justin [name]   Account Number:  [id number]  Date Initiated:  04/22/2013  Documentation initiated by:  GRAVES-BIGELOW,BRENDA  Subjective/Objective Assessment:   Pt admitted for aphasia/numbness and tingling in his right arm. MRI:  revealed Several foci of acute infarction measuring a centimeter or less in  the left hemisphere along the margins of the left MCA territory. Pt is from home with wife.     Action/Plan:   CM will continue to monitor for disposition needs.   Anticipated DC Date:  04/25/2013   Anticipated DC Plan:  HOME/SELF CARE      DC Planning Services  CM consult  PCP issues      Choice offered to / List presented to:             Status of service:  In process, will continue to follow Medicare Important Message given?   (If response is "NO", the following Medicare IM given date fields will be blank) Date Medicare IM given:   Date Additional Medicare IM given:    Discharge Disposition:    Per UR Regulation:  Reviewed for med. necessity/level of care/duration of stay  If discussed at Long Length of Stay Meetings, dates discussed:    Comments:  04/24/13- 1650- Donn Pierini RN, BSN [phone] Referral received for PCP needs- pt has insurance- spoke with pt and wife at bedside and gave pt information for finding a PCP- including [facility] and Physician Referral line phone numbers. Pt to f/u with calling local MD office to see if they are excepting new pts.

## 2013-04-22 NOTE — Progress Notes (Signed)
UR Completed Shellye Zandi Graves-Bigelow, RN,BSN [phone]  

## 2013-04-22 NOTE — Evaluation (Signed)
Speech Language Pathology Evaluation Patient Details Name: JASIEL BELISLE MRN: [id number] DOB: November 07, 1950 Today's Date: 04/22/2013 Time: 6213-0865 SLP Time Calculation (min): 10 min  Problem List: There are no active problems to display for this patient.  Past Medical History:  Past Medical History  Diagnosis Date  . Bell's palsy 1980's; [id number]; 2000's; 12/2012; 03/2013    "multiple times" (04/21/2013)  . TIA (transient ischemic attack) 04/21/2013   Past Surgical History:  Past Surgical History  Procedure Laterality Date  . No past surgeries     HPI:  62 year old male with a history of Bell's palsy, recently treated for a URI with prednisone and azithromycin, who presents to [facility] because of inability to speak lasting about 20 minutes. Prior to his episode the patient started experiencing numbness and tingling in his right arm. 1980s he had an episode of Bell's palsy, with an exacerbation 2 weeks ago. The patient is a nonsmoker, denies any prior history of CVA, denies any history of hypertension but is hypertensive today with systolic blood pressure the 170s.  MRI revealed Several foci of acute infarction measuring a centimeter or less in the left hemisphere along the margins of the left MCA territory.   Assessment / Plan / Recommendation Clinical Impression  Speech-language-cognition is within functional limits.  He was able to recall results of all testing as well as information from PA-C this morning.  He has been working some from bed and not having difficulty reading  comprehending or texting on smart phone.  Cognition is WFL's.  No f/u ST needed.     SLP Assessment  Patient does not need any further Speech Lanaguage Pathology Services    Follow Up Recommendations  None    Frequency and Duration        Pertinent Vitals/Pain none       SLP Evaluation Prior Functioning  Cognitive/Linguistic Baseline: Within functional limits  Lives With: Spouse Vocation:  Full time employment   Cognition  Overall Cognitive Status: Within Functional Limits for tasks assessed Orientation Level: Oriented X4    Comprehension  Auditory Comprehension Overall Auditory Comprehension: Appears within functional limits for tasks assessed Visual Recognition/Discrimination Discrimination: Not tested Reading Comprehension Reading Status: Not tested (pr. reported no deficits)    Expression Expression Primary Mode of Expression: Verbal Verbal Expression Overall Verbal Expression: Appears within functional limits for tasks assessed Written Expression Written Expression: Not tested   Oral / Motor Oral Motor/Sensory Function Overall Oral Motor/Sensory Function: Impaired at baseline (slight right labial droop sp Bell's palst in 80's) Motor Speech Overall Motor Speech: Appears within functional limits for tasks assessed Intelligibility: Intelligible Motor Planning: Witnin functional limits   GO Functional Assessment Tool Used: clinical judgement Functional Limitations: Spoken language expressive Spoken Language Expression Current Status [phone]): 0 percent impaired, limited or restricted Spoken Language Expression Goal Status (G2952): 0 percent impaired, limited or restricted Spoken Language Expression Discharge Status [phone]): 0 percent impaired, limited or restricted   Royce Macadamia M.Ed [employer] [phone]  04/22/2013

## 2013-04-22 NOTE — Progress Notes (Signed)
Physical Therapy Discharge Patient Details Name: TYREASE VANDEBERG MRN: [id number] DOB: Feb 24, 1951 Today's Date: 04/22/2013 Time: 6962-9528 PT Time Calculation (min): 15 min  Patient discharged from PT services secondary to goals met and no further PT needs identified.  Please see latest therapy progress note for current level of functioning and progress toward goals.    Progress and discharge plan discussed with patient and/or caregiver: Patient/Caregiver agrees with plan  GP     Fabio Asa 04/22/2013, 4:15 PM

## 2013-04-23 DIAGNOSIS — I6350 Cerebral infarction due to unspecified occlusion or stenosis of unspecified cerebral artery: Secondary | ICD-10-CM

## 2013-04-23 MED ORDER — SODIUM CHLORIDE 0.9 % IV SOLN: INTRAVENOUS | Status: DC

## 2013-04-23 MED ORDER — CHLORHEXIDINE GLUCONATE 4 % EX LIQD
60.0000 mL | Freq: Once | CUTANEOUS | Status: AC
  Administered 2013-04-24: 4 via TOPICAL
  Filled 2013-04-23: qty 60

## 2013-04-23 MED ORDER — CEFAZOLIN SODIUM-DEXTROSE 2-3 GM-% IV SOLR: 2.0000 g | INTRAVENOUS | Status: DC

## 2013-04-23 NOTE — Progress Notes (Signed)
[facility] PROGRESS NOTE  Justin [name] ZOX:[id number] DOB: 1951-10-06 DOA: 04/21/2013 PCP: No PCP Per Patient Brief HPI: 62 year old male with a history of Bell's palsy, recently treated for a URI with prednisone and azithromycin, who presents to [facility] because of inability to speak lasting about 20 minutes. Prior to his episode the patient started experiencing numbness and tingling in his right arm. He denied any chest pain any shortness of breath. 1980s he had an episode of Bell's palsy, with an exacerbation 2 weeks ago. The patient is a nonsmoker, denies any prior history of CVA, . He was admitted for evaluation of CVA.   Assessment/Plan: 1. Acute CVA:  - ASPIRIN 325 mg daily.  carotid dulplex did not show significant stenosis. Echo showed good LVEF of 55% no wall motion abnormalities, and grade 1 diastolic dysfunction, neurology recommending TEE.   LDL is 109, hgba1c is 5.6.  2. Bell's Palsy: stable.  3. DVT prophylaxis.   Code Status: full code Family Communication: none present.  Disposition Plan: pending.    Consultants:  NEUROLOGY  Procedures:  MRI BRAIN  MRA HEAD AND NECK.  Antibiotics:  none  HPI/Subjective: No new complaints wants to go home.   Objective: Filed Vitals:   04/22/13 2000 04/23/13 0000 04/23/13 0400 04/23/13 0927  BP: 128/76 101/65 104/63 120/80  Pulse: 74 67 61 74  Temp: 97.9 F (36.6 C) 98 F (36.7 C) 98.5 F (36.9 C) 98.2 F (36.8 C)  TempSrc: Oral Oral Oral Oral  Resp: 18 18  19   Height:      Weight:   76.068 kg (167 lb 11.2 oz)   SpO2: 97% 96% 98% 96%    Intake/Output Summary (Last 24 hours) at 04/23/13 1229 Last data filed at 04/23/13 0837  Gross per 24 hour  Intake    360 ml  Output      0 ml  Net    360 ml   Filed Weights   04/21/13 1449 04/21/13 1652 04/23/13 0400  Weight: 83.915 kg (185 lb) 83.915 kg (185 lb) 76.068 kg (167 lb 11.2 oz)    Exam:   General:  Alert afebrile  comfortable  Cardiovascular: s1s2 RRR  Respiratory: ctab  Abdomen: soft NT ND BS+  Musculoskeletal: no pedal edema  Data Reviewed: Basic Metabolic Panel:  Recent Labs Lab 04/21/13 1225  NA 142  K 3.8  CL 104  CO2 24  GLUCOSE 101*  BUN 15  CREATININE 1.00  CALCIUM 10.1   Liver Function Tests:  Recent Labs Lab 04/21/13 1225  AST 20  ALT 28  ALKPHOS 85  BILITOT 0.5  PROT 7.5  ALBUMIN 4.2   No results found for this basename: LIPASE, AMYLASE,  in the last 168 hours No results found for this basename: AMMONIA,  in the last 168 hours CBC:  Recent Labs Lab 04/21/13 1225 04/21/13 1945  WBC 8.0 10.1  NEUTROABS 5.2  --   HGB 15.8 14.9  HCT 46.2 43.5  MCV 86.8 85.8  PLT 185 183   Cardiac Enzymes:  Recent Labs Lab 04/21/13 1225  TROPONINI <0.30   BNP (last 3 results) No results found for this basename: PROBNP,  in the last 8760 hours CBG:  Recent Labs Lab 04/21/13 1225  GLUCAP 101*    No results found for this or any previous visit (from the past 240 hour(s)).   Studies: Dg Chest 2 View  04/21/2013   *RADIOLOGY REPORT*  Clinical Data: Stroke  CHEST - 2  VIEW  Comparison: None  Findings: Heart size is normal.  No pleural effusion or edema.  No airspace consolidation identified.  Review of the visualized osseous structures is unremarkable.  IMPRESSION:  1.  No acute cardiopulmonary abnormalities.   Original Report Authenticated By: Signa Kell, M.D.   Ct Head Wo Contrast  04/21/2013   *RADIOLOGY REPORT*  Clinical Data: Tingling right hand and difficulty with speech. Symptoms now resolved.  CT HEAD WITHOUT CONTRAST  Technique:  Contiguous axial images were obtained from the base of the skull through the vertex without contrast.  Comparison: None.  Findings: No intracranial hemorrhage.  No CT evidence of large acute infarct.  Subtle hypodensity within the posterior limb of the internal capsule slightly more notable on the left.  Small acute infarct not  entirely excluded although this may be entirely within normal limits.  No intracranial mass lesion detected on this unenhanced exam.  No hydrocephalus.  Mastoid air cells, middle ear cavities and visualized sinuses are clear.  IMPRESSION: No intracranial hemorrhage.  No CT evidence of large acute infarct.  Subtle hypodensity within the posterior limb of the internal capsule slightly more notable on the left. This may represent a normal finding although a small acute infarct is not entirely excluded.  Results discussed with Dr. Rubin Payor 04/21/2013 12:48 p.m.   Original Report Authenticated By: Lacy Duverney, M.D.   Mr Brain Wo Contrast  04/22/2013   *RADIOLOGY REPORT*  Clinical Data:  Tingling of the right hand.  Speech disturbance.  MRI HEAD WITHOUT CONTRAST MRA HEAD WITHOUT CONTRAST  Technique:  Multiplanar, multiecho pulse sequences of the brain and surrounding structures were obtained without intravenous contrast. Angiographic images of the head were obtained using MRA technique without contrast.  Comparison:  Head CT same day  MRI HEAD  Findings:  There are four or five sub-centimeter acute infarctions in the left hemisphere in the periphery of the brain at the parietal occipital junction and in the parietal region at the vertex.  These could be of micro embolic infarctions or could be watershed infarctions along the margins of the left MCA territory. No other acute infarction.  The brainstem and cerebellum are normal.  The cerebral hemispheres are otherwise normal without evidence of other old or acute infarction, mass lesion, hemorrhage, hydrocephalus or extra-axial collection.  No pituitary mass.  No inflammatory sinus disease.  No skull or skull base lesion.  IMPRESSION: Several foci of acute infarction measuring a centimeter or less in the left hemisphere along the margins of the left MCA territory. These could be due to micro embolic infarctions or could be watershed infarctions.  No evidence of swelling  or hemorrhage.  The brain appears otherwise normal.  MRA HEAD  Findings: Both internal carotid arteries are widely patent into the brain.  The anterior and middle cerebral vessels are normal without proximal stenosis, aneurysm or vascular malformation.  Both vertebral arteries are patent with the left being dominant.  No basilar stenosis.  Posterior circulation branch vessels are normal.  IMPRESSION: Normal intracranial MR angiography of the large and medium-sized vessels.  This appearance raises the likelihood of micro embolic infarctions over watershed infarctions.  Evaluation of the more proximal vasculature is suggested.   Original Report Authenticated By: Paulina Fusi, M.D.   Mr Mra Head/brain Wo Cm  04/22/2013   *RADIOLOGY REPORT*  Clinical Data:  Tingling of the right hand.  Speech disturbance.  MRI HEAD WITHOUT CONTRAST MRA HEAD WITHOUT CONTRAST  Technique:  Multiplanar, multiecho pulse sequences of  the brain and surrounding structures were obtained without intravenous contrast. Angiographic images of the head were obtained using MRA technique without contrast.  Comparison:  Head CT same day  MRI HEAD  Findings:  There are four or five sub-centimeter acute infarctions in the left hemisphere in the periphery of the brain at the parietal occipital junction and in the parietal region at the vertex.  These could be of micro embolic infarctions or could be watershed infarctions along the margins of the left MCA territory. No other acute infarction.  The brainstem and cerebellum are normal.  The cerebral hemispheres are otherwise normal without evidence of other old or acute infarction, mass lesion, hemorrhage, hydrocephalus or extra-axial collection.  No pituitary mass.  No inflammatory sinus disease.  No skull or skull base lesion.  IMPRESSION: Several foci of acute infarction measuring a centimeter or less in the left hemisphere along the margins of the left MCA territory. These could be due to micro embolic  infarctions or could be watershed infarctions.  No evidence of swelling or hemorrhage.  The brain appears otherwise normal.  MRA HEAD  Findings: Both internal carotid arteries are widely patent into the brain.  The anterior and middle cerebral vessels are normal without proximal stenosis, aneurysm or vascular malformation.  Both vertebral arteries are patent with the left being dominant.  No basilar stenosis.  Posterior circulation branch vessels are normal.  IMPRESSION: Normal intracranial MR angiography of the large and medium-sized vessels.  This appearance raises the likelihood of micro embolic infarctions over watershed infarctions.  Evaluation of the more proximal vasculature is suggested.   Original Report Authenticated By: Paulina Fusi, M.D.    Scheduled Meds: . aspirin  300 mg Rectal Daily   Or  . aspirin  325 mg Oral Daily  . enoxaparin (LOVENOX) injection  40 mg Subcutaneous Q24H  . simvastatin  10 mg Oral q1800   Continuous Infusions:   Active Problems:   CVA (cerebral infarction)   Bell's palsy    Time spent: 25 minutes    Sequoyah Ramone  [facility] Pager [phone]. If 7PM-7AM, please contact night-coverage at [email or web address], password [facility] 04/23/2013, 12:29 PM  LOS: 2 days

## 2013-04-23 NOTE — Progress Notes (Signed)
Stroke Team Progress Note  HISTORY Justin [name] is an 62 y.o. male with history of recurrent Bell's palsy on the left. Patient was at work 04/21/2013 at 12 PM when he noted his right arm was numb and he could not express himself. He was making sounds but could not make intelligible conversation. This lasted for about 20 minutes and fully resolved. HE was brought to the hospital due to fear he may have had a stroke. He was on no medications prior to hospitalization. Initial CT head was negative but follow up MRI showed Currently he is back to his baseline. Several foci of acute infarction measuring a centimeter or less in the left hemisphere along the margins of the left MCA territory. MRA showed normal large and medium size vessels. Patient was not a TPA candidate secondary to symptom resolution. He was admitted for further evaluation and treatment.  SUBJECTIVE His wife is at the bedside.  Overall he feels his condition is completely resolved.   OBJECTIVE Most recent Vital Signs: Filed Vitals:   04/22/13 1230 04/22/13 2000 04/23/13 0000 04/23/13 0400  BP: 131/86 128/76 101/65 104/63  Pulse: 81 74 67 61  Temp: 98.8 F (37.1 C) 97.9 F (36.6 C) 98 F (36.7 C) 98.5 F (36.9 C)  TempSrc: Oral Oral Oral Oral  Resp: 17 18 18   Height:      Weight:    76.068 kg (167 lb 11.2 oz)  SpO2: 97% 97% 96% 98%   CBG (last 3)   Recent Labs  04/21/13 1225  GLUCAP 101*    IV Fluid Intake:     MEDICATIONS  . aspirin  300 mg Rectal Daily   Or  . aspirin  325 mg Oral Daily  . enoxaparin (LOVENOX) injection  40 mg Subcutaneous Q24H  . simvastatin  10 mg Oral q1800   PRN:  acetaminophen, hydrALAZINE, senna-docusate  Diet:  General thin liquids Activity:  Bedrest DVT Prophylaxis:  Lovenox 40 mg sq daily   CLINICALLY SIGNIFICANT STUDIES Basic Metabolic Panel:  Recent Labs Lab 04/21/13 1225  NA 142  K 3.8  CL 104  CO2 24  GLUCOSE 101*  BUN 15  CREATININE 1.00  CALCIUM 10.1   Liver  Function Tests:  Recent Labs Lab 04/21/13 1225  AST 20  ALT 28  ALKPHOS 85  BILITOT 0.5  PROT 7.5  ALBUMIN 4.2   CBC:  Recent Labs Lab 04/21/13 1225 04/21/13 1945  WBC 8.0 10.1  NEUTROABS 5.2  --   HGB 15.8 14.9  HCT 46.2 43.5  MCV 86.8 85.8  PLT 185 183   Coagulation:  Recent Labs Lab 04/21/13 1225  LABPROT 12.7  INR 0.97   Cardiac Enzymes:  Recent Labs Lab 04/21/13 1225  TROPONINI <0.30   Urinalysis:  Recent Labs Lab 04/21/13 1402  COLORURINE YELLOW  LABSPEC 1.026  PHURINE 6.0  GLUCOSEU NEGATIVE  HGBUR NEGATIVE  BILIRUBINUR NEGATIVE  KETONESUR NEGATIVE  PROTEINUR NEGATIVE  UROBILINOGEN 0.2  NITRITE NEGATIVE  LEUKOCYTESUR NEGATIVE   Lipid Panel    Component Value Date/Time   CHOL 170 04/22/2013 0445   TRIG 107 04/22/2013 0445   HDL 46 04/22/2013 0445   CHOLHDL 3.7 04/22/2013 0445   VLDL 21 04/22/2013 0445   LDLCALC 103* 04/22/2013 0445   HgbA1C  Lab Results  Component Value Date   HGBA1C 5.6 04/22/2013    Urine Drug Screen:     Component Value Date/Time   LABOPIA NONE DETECTED 04/21/2013 1402   COCAINSCRNUR NONE   DETECTED 04/21/2013 1402   LABBENZ NONE DETECTED 04/21/2013 1402   AMPHETMU NONE DETECTED 04/21/2013 1402   THCU NONE DETECTED 04/21/2013 1402   LABBARB NONE DETECTED 04/21/2013 1402    Alcohol Level:  Recent Labs Lab 04/21/13 1225  ETH <11   CT of the brain  04/21/2013    No intracranial hemorrhage.  No CT evidence of large acute infarct.  Subtle hypodensity within the posterior limb of the internal capsule slightly more notable on the left. This may represent a normal finding although a small acute infarct is not entirely excluded.    MRI of the brain  04/22/2013    Several foci of acute infarction measuring a centimeter or less in the left hemisphere along the margins of the left MCA territory. These could be due to micro embolic infarctions or could be watershed infarctions.  No evidence of swelling or hemorrhage.  The brain appears otherwise  normal.  MRA of the brain  04/22/2013   Normal intracranial MR angiography of the large and medium-sized vessels.  This appearance raises the likelihood of micro embolic infarctions over watershed infarctions.  Evaluation of the more proximal vasculature is suggested.  2D Echocardiogram  EF 55-60% with no source of embolus.   Carotid Doppler  There is no obvious evidence of hemodynamically significant internal carotid artery stenosis >40%. Vertebral arteries are patent with antegrade flow.  CXR  04/21/2013   No acute cardiopulmonary abnormalities.    EKG   normal sinus rhythm.   Therapy Recommendations no needs  Physical Exam   Frail middle-aged Caucasian pleasant male currently not in distress.Awake alert. Afebrile. Head is nontraumatic. Neck is supple without bruit. Hearing is normal. Cardiac exam no murmur or gallop. Lungs are clear to auscultation. Distal pulses are well felt. Neurological Exam ;  Awake  Alert oriented x 3. Normal speech and language.eye movements full without nystagmus.fundi were not visualized. Vision acuity and fields appear normal. Hearing is normal. Palatal movements are normal. Face symmetric. Tongue midline. Normal strength, tone, reflexes and coordination. Normal sensation. Gait deferred. ASSESSMENT Mr. Justin [name] is a 62 y.o. male presenting with transient right arm numbness and inability to express himself.  Imaging confirms a left MCA tiny embolic infarcts. Infarct felt to be  embolic secondary to unknown etiology, suspect atrial fibrillation.  On No anti-thrombotics prior to admission. Now on aspirin 325 mg orally every day for secondary stroke prevention. Patient with no resultant neuro- deficits. Work up underway.   History of TIA  History of Bell's palsy x5  Hospital day # 2  TREATMENT/PLAN  Continue aspirin 325 mg orally every day for secondary stroke prevention. TEE to look for embolic source. Arranged with [facility] for tomorrow.  If  positive for PFO (patent foramen ovale), check bilateral lower extremity venous dopplers to rule out DVT as possible source of stroke.  If TEE negative, [place] cardiologist will place implantable loop recorder to evaluate for atrial fibrillation. This has been explained to patient/family and they are agreeable. I have personally discussed with RN and written order for consent. There is a downtime order form  for TEE placement located in the shadow chart as an electronic order in [facility] is not currently available. Dr. Sethi has discussed plan with Dr. Akula and Dr. Allred  SHARON BIBY, MSN, RN, ANVP-BC, ANP-BC, GNP-BC [facility] Pager: [phone] 04/23/2013 9:22 AM  I have personally obtained a history, examined the patient, evaluated imaging results, and formulated the assessment and plan of   care. I agree with the above. Pramod Sethi, MD 

## 2013-04-23 NOTE — Consult Note (Signed)
ELECTROPHYSIOLOGY CONSULT NOTE  Patient ID: Justin [name] MRN: [id number], DOB/AGE: [birth year]   Admit date: 04/21/2013 Date of Consult: 04/23/2013  Primary Physician: None Primary Cardiologist: New to [place] Reason for Consultation: Cryptogenic stroke; recommendations regarding Implantable Loop Recorder  History of Present Illness Justin [name] was admitted on 04/21/2013 with acute left MCA CVA. He has been monitored on telemetry which has demonstrated no arrhythmias. No cause has been identified. Stroke work-up is to be completed tomorrow with a TEE. EP has been asked to evaluate for placement of an implantable loop recorder to monitor for atrial fibrillation.  Past Medical History Past Medical History  Diagnosis Date  . Bell's palsy 1980's; [id number]; 2000's; 12/2012; 03/2013    "multiple times" (04/21/2013)  . TIA (transient ischemic attack) 04/21/2013    Past Surgical History Past Surgical History  Procedure Laterality Date  . No past surgeries      Allergies/Intolerances No Known Allergies Inpatient Medications . aspirin  300 mg Rectal Daily   Or  . aspirin  325 mg Oral Daily  . enoxaparin (LOVENOX) injection  40 mg Subcutaneous Q24H  . simvastatin  10 mg Oral q1800   Social History History   Social History  . Marital Status: Married    Spouse Name: N/A    Number of Children: N/A  . Years of Education: N/A   Occupational History  . Not on file.   Social History Main Topics  . Smoking status: Never Smoker   . Smokeless tobacco: Never Used  . Alcohol Use: Yes     Comment: 03/22/2013 "glass of wine couple times/yr"  . Drug Use: No  . Sexually Active: Yes   Other Topics Concern  . Not on file   Social History Narrative  . No narrative on file    Review of Systems General: No chills, fever, night sweats or weight changes  Cardiovascular:  No chest pain, dyspnea on exertion, edema, orthopnea, palpitations, paroxysmal nocturnal dyspnea Dermatological: No rash,  lesions or masses Respiratory: No cough, dyspnea Urologic: No hematuria, dysuria Abdominal: No nausea, vomiting, diarrhea, bright red blood per rectum, melena, or hematemesis Neurologic: No visual changes, weakness, changes in mental status All other systems reviewed and are otherwise negative except as noted above.  Physical Exam Blood pressure 132/79, pulse 88, temperature 98.1 F (36.7 C), temperature source Oral, resp. rate 18, height 5\' 9"  (1.753 m), weight 167 lb 11.2 oz (76.068 kg), SpO2 94.00%.  General: Well developed, well appearing 62 y.o. male in no acute distress. HEENT: Normocephalic, atraumatic. EOMs intact. Sclera nonicteric. Oropharynx clear.  Neck: Supple. No JVD. Lungs: Respirations regular and unlabored, CTA bilaterally. No wheezes, rales or rhonchi. Heart: RRR. S1, S2 present. No murmurs, rub, S3 or S4. Abdomen: Soft, non-distended.  Extremities: No clubbing, cyanosis or edema. DP/PT/Radials 2+ and equal bilaterally. Psych: Normal affect. Neuro: Alert and oriented X 3. Moves all extremities spontaneously. Musculoskeletal: No kyphosis. Skin: Intact. Warm and dry. No rashes or petechiae in exposed areas.   Labs Lab Results  Component Value Date   WBC 10.1 04/21/2013   HGB 14.9 04/21/2013   HCT 43.5 04/21/2013   MCV 85.8 04/21/2013   PLT 183 04/21/2013    Recent Labs Lab 04/21/13 1225  NA 142  K 3.8  CL 104  CO2 24  BUN 15  CREATININE 1.00  CALCIUM 10.1  PROT 7.5  BILITOT 0.5  ALKPHOS 85  ALT 28  AST 20  GLUCOSE 101*    Recent Labs  04/21/13  1225  INR 0.97    Radiology/Studies Dg Chest 2 View 04/21/2013   IMPRESSION:  1.  No acute cardiopulmonary abnormalities.    Original Report Authenticated By: Signa Kell, M.D.   Mr Mra Head/brain Wo Cm 04/22/2013   IMPRESSION: Several foci of acute infarction measuring a centimeter or less in the left hemisphere along the margins of the left MCA territory. These could be due to micro embolic infarctions or  could be watershed infarctions.  No evidence of swelling or hemorrhage.  The brain appears otherwise normal.  MRA HEAD  Findings: Both internal carotid arteries are widely patent into the brain.  The anterior and middle cerebral vessels are normal without proximal stenosis, aneurysm or vascular malformation.  Both vertebral arteries are patent with the left being dominant.  No basilar stenosis.  Posterior circulation branch vessels are normal.   IMPRESSION: Normal intracranial MR angiography of the large and medium-sized vessels.  This appearance raises the likelihood of micro embolic infarctions over watershed infarctions.  Evaluation of the more proximal vasculature is suggested.    Original Report Authenticated By: Paulina Fusi, M.D.    12-lead ECG on admission shows SR Telemetry shows SR; no arrhythmias   Assessment and Plan 1. Cryptogenic stroke  - acute left MCA CVA, suspected cardioembolic   Justin [name] presents with acute CVA. No source/cause has been identified thus far. Telemetry reveals no arrhythmias. He is scheduled for TEE tomorrow. If this is negative, we have recommended ILR insertion to evaluate for AF. The indication for loop recorder insertion in setting of cryptogenic stroke was discussed with the patient. The loop recorder insertion procedure was reviewed in detail including risks and benefits. These risks include but are not limited to bleeding and infection. The patient expressed verbal understanding and agrees to proceed. The patient was also counseled regarding wound care and device follow-up.  Dr. Johney Frame to see Signed, Rick Duff 04/23/2013, 4:16 PM  As above Discussed with Dr Pearlean Brownie, PFO without venous clot  Would like ILR to exclude Afib Have reviewed with Pt and wife who agree and are willing to proceed

## 2013-04-24 ENCOUNTER — Encounter (HOSPITAL_COMMUNITY)
Admission: EM | Disposition: A | Discharge status: Home / Self Care | Payer: Self-pay | Source: Home / Self Care | Attending: Internal Medicine

## 2013-04-24 ENCOUNTER — Encounter (HOSPITAL_COMMUNITY): Payer: Self-pay

## 2013-04-24 DIAGNOSIS — I6789 Other cerebrovascular disease: Secondary | ICD-10-CM

## 2013-04-24 DIAGNOSIS — I6350 Cerebral infarction due to unspecified occlusion or stenosis of unspecified cerebral artery: Secondary | ICD-10-CM

## 2013-04-24 HISTORY — PX: TEE WITHOUT CARDIOVERSION: SHX5443

## 2013-04-24 HISTORY — PX: LOOP RECORDER IMPLANT: SHX5477

## 2013-04-24 LAB — SURGICAL PCR SCREEN
MRSA, PCR: NEGATIVE
Staphylococcus aureus: POSITIVE — AB

## 2013-04-24 SURGERY — ECHOCARDIOGRAM, TRANSESOPHAGEAL
Anesthesia: Moderate Sedation

## 2013-04-24 SURGERY — LOOP RECORDER IMPLANT
Anesthesia: LOCAL

## 2013-04-24 MED ORDER — STROKE: EARLY STAGES OF RECOVERY BOOK
Freq: Once | Status: AC
  Administered 2013-04-24: 07:00:00
  Filled 2013-04-24: qty 1

## 2013-04-24 MED ORDER — ASPIRIN 325 MG PO TABS: 325.0000 mg | ORAL_TABLET | Freq: Every day | ORAL | Status: DC

## 2013-04-24 MED ORDER — SIMVASTATIN 10 MG PO TABS: 10.0000 mg | ORAL_TABLET | Freq: Every day | ORAL | Status: DC

## 2013-04-24 MED ORDER — MIDAZOLAM HCL 5 MG/ML IJ SOLN
INTRAMUSCULAR | Status: AC
  Filled 2013-04-24: qty 2

## 2013-04-24 MED ORDER — FENTANYL CITRATE 0.05 MG/ML IJ SOLN
INTRAMUSCULAR | Status: AC
  Filled 2013-04-24: qty 2

## 2013-04-24 MED ORDER — LIDOCAINE-EPINEPHRINE 1 %-1:100000 IJ SOLN
INTRAMUSCULAR | Status: AC
  Filled 2013-04-24: qty 1

## 2013-04-24 MED ORDER — FENTANYL CITRATE 0.05 MG/ML IJ SOLN
INTRAMUSCULAR | Status: DC | PRN
  Administered 2013-04-24: 25 ug via INTRAVENOUS

## 2013-04-24 MED ORDER — BUTAMBEN-TETRACAINE-BENZOCAINE 2-2-14 % EX AERO
INHALATION_SPRAY | CUTANEOUS | Status: DC | PRN
  Administered 2013-04-24: 2 via TOPICAL

## 2013-04-24 MED ORDER — MIDAZOLAM HCL 10 MG/2ML IJ SOLN
INTRAMUSCULAR | Status: DC | PRN
  Administered 2013-04-24: 3 mg via INTRAVENOUS

## 2013-04-24 NOTE — Progress Notes (Signed)
Physician Discharge Summary  Jake Fuhrmann Haacke WUJ:[id number] DOB: [birth year] DOA: 04/21/2013  PCP: No PCP Per Patient  Admit date: 04/21/2013 Discharge date: 04/24/2013  Time spent: 30 minutes  Recommendations for Outpatient Follow-up:  1. Follow up with neurology 2. Follow up with PCP as recommended.  3. Follow up with Dr Graciela Husbands as recommended.   Discharge Diagnoses:  Active Problems:   CVA (cerebral infarction)   Bell's palsy   Discharge Condition: improved.   Diet recommendation: regular   Filed Weights   04/21/13 1652 04/23/13 0400 04/24/13 0400  Weight: 83.915 kg (185 lb) 76.068 kg (167 lb 11.2 oz) 75.705 kg (166 lb 14.4 oz)    History of present illness:  62 year old male with a history of Bell's palsy, recently treated for a URI with prednisone and azithromycin, who presents to [facility] because of inability to speak lasting about 20 minutes. Prior to his episode the patient started experiencing numbness and tingling in his right arm. He denied any chest pain any shortness of breath. 1980s he had an episode of Bell's palsy, with an exacerbation 2 weeks ago. The patient is a nonsmoker, denies any prior history of CVA, . He was admitted for evaluation of CVA.  His MRI showed multiple strokes, and neurology consulted, strarted him on 325mg  of aspirin. He underwent TEE, SHOWED no thrombus, but PFO, venous duplex neg for DVT. He underwent a loop recorder placed. And is being discharged home.   Hospital Course:  1. Acute CVA:  - ASPIRIN 325 mg daily. carotid dulplex did not show significant stenosis. Echo showed good LVEF of 55% no wall motion abnormalities, and grade 1 diastolic dysfunction,. He underwent TEE, showed no thrombus. Had a pfo, underwent venous duplex  No DVT. He underwent a loop recorder implantation. LDL is 109, hgba1c is 5.6.  2. Bell's Palsy: stable.    Procedures:  Loop recorder placement.   Consultations:  Cardiology for  TEE  Neurology   Discharge Exam: Filed Vitals:   04/24/13 1250 04/24/13 1300 04/24/13 1310 04/24/13 1500  BP: 110/65 110/65 116/72 133/87  Pulse:    83  Temp:    97.5 F (36.4 C)  TempSrc:    Oral  Resp: 18 27 21 20   Height:      Weight:      SpO2: 92% 91% 94% 100%   General: Alert afebrile comfortable  Cardiovascular: s1s2 RRR  Respiratory: ctab  Abdomen: soft NT ND BS+  Musculoskeletal: no pedal edema   Discharge Instructions      Discharge Orders   Future Appointments Provider Department Dept Phone   04/30/2013 11:00 AM [facility] [place] [employer] [facility] [place]) [phone]   Future Orders Complete By Expires     Discharge instructions  As directed     Comments:      Follow up with PCP and neurologist as recommended.    Increase activity slowly  As directed         Medication List         aspirin 325 MG tablet  Take 1 tablet (325 mg total) by mouth daily.     simvastatin 10 MG tablet  Commonly known as:  ZOCOR  Take 1 tablet (10 mg total) by mouth daily at 6 PM.       No Known Allergies Follow-up Information   Follow up with Gates Rigg, MD. Schedule an appointment as soon as possible for a visit in 2 months. (stroke clinic)    Contact  information:   [address] [place] [address] [phone]       Follow up with No PCP Per Patient. Schedule an appointment as soon as possible for a visit in 1 week.   Contact information:   [address] [place] [address] [phone]       Follow up with [facility] On 04/30/2013. (At 11:00 AM for wound check and follow-up after loop recorder insertion)    Contact information:   [address] [place] [address] [phone]       The results of significant diagnostics from this hospitalization (including imaging, microbiology, ancillary and laboratory) are listed below for reference.    Significant Diagnostic Studies: Dg Chest 2  View  04/21/2013   *RADIOLOGY REPORT*  Clinical Data: Stroke  CHEST - 2 VIEW  Comparison: None  Findings: Heart size is normal.  No pleural effusion or edema.  No airspace consolidation identified.  Review of the visualized osseous structures is unremarkable.  IMPRESSION:  1.  No acute cardiopulmonary abnormalities.   Original Report Authenticated By: Signa Kell, M.D.   Ct Head Wo Contrast  04/21/2013   *RADIOLOGY REPORT*  Clinical Data: Tingling right hand and difficulty with speech. Symptoms now resolved.  CT HEAD WITHOUT CONTRAST  Technique:  Contiguous axial images were obtained from the base of the skull through the vertex without contrast.  Comparison: None.  Findings: No intracranial hemorrhage.  No CT evidence of large acute infarct.  Subtle hypodensity within the posterior limb of the internal capsule slightly more notable on the left.  Small acute infarct not entirely excluded although this may be entirely within normal limits.  No intracranial mass lesion detected on this unenhanced exam.  No hydrocephalus.  Mastoid air cells, middle ear cavities and visualized sinuses are clear.  IMPRESSION: No intracranial hemorrhage.  No CT evidence of large acute infarct.  Subtle hypodensity within the posterior limb of the internal capsule slightly more notable on the left. This may represent a normal finding although a small acute infarct is not entirely excluded.  Results discussed with Dr. Rubin Payor 04/21/2013 12:48 p.m.   Original Report Authenticated By: Lacy Duverney, M.D.   Mr Brain Wo Contrast  04/22/2013   *RADIOLOGY REPORT*  Clinical Data:  Tingling of the right hand.  Speech disturbance.  MRI HEAD WITHOUT CONTRAST MRA HEAD WITHOUT CONTRAST  Technique:  Multiplanar, multiecho pulse sequences of the brain and surrounding structures were obtained without intravenous contrast. Angiographic images of the head were obtained using MRA technique without contrast.  Comparison:  Head CT same day  MRI HEAD   Findings:  There are four or five sub-centimeter acute infarctions in the left hemisphere in the periphery of the brain at the parietal occipital junction and in the parietal region at the vertex.  These could be of micro embolic infarctions or could be watershed infarctions along the margins of the left MCA territory. No other acute infarction.  The brainstem and cerebellum are normal.  The cerebral hemispheres are otherwise normal without evidence of other old or acute infarction, mass lesion, hemorrhage, hydrocephalus or extra-axial collection.  No pituitary mass.  No inflammatory sinus disease.  No skull or skull base lesion.  IMPRESSION: Several foci of acute infarction measuring a centimeter or less in the left hemisphere along the margins of the left MCA territory. These could be due to micro embolic infarctions or could be watershed infarctions.  No evidence of swelling or hemorrhage.  The brain appears otherwise normal.  MRA HEAD  Findings: Both internal carotid arteries are widely patent into the brain.  The anterior and middle cerebral vessels are normal without proximal stenosis, aneurysm or vascular malformation.  Both vertebral arteries are patent with the left being dominant.  No basilar stenosis.  Posterior circulation branch vessels are normal.  IMPRESSION: Normal intracranial MR angiography of the large and medium-sized vessels.  This appearance raises the likelihood of micro embolic infarctions over watershed infarctions.  Evaluation of the more proximal vasculature is suggested.   Original Report Authenticated By: Paulina Fusi, M.D.   Mr Mra Head/brain Wo Cm  04/22/2013   *RADIOLOGY REPORT*  Clinical Data:  Tingling of the right hand.  Speech disturbance.  MRI HEAD WITHOUT CONTRAST MRA HEAD WITHOUT CONTRAST  Technique:  Multiplanar, multiecho pulse sequences of the brain and surrounding structures were obtained without intravenous contrast. Angiographic images of the head were obtained using MRA  technique without contrast.  Comparison:  Head CT same day  MRI HEAD  Findings:  There are four or five sub-centimeter acute infarctions in the left hemisphere in the periphery of the brain at the parietal occipital junction and in the parietal region at the vertex.  These could be of micro embolic infarctions or could be watershed infarctions along the margins of the left MCA territory. No other acute infarction.  The brainstem and cerebellum are normal.  The cerebral hemispheres are otherwise normal without evidence of other old or acute infarction, mass lesion, hemorrhage, hydrocephalus or extra-axial collection.  No pituitary mass.  No inflammatory sinus disease.  No skull or skull base lesion.  IMPRESSION: Several foci of acute infarction measuring a centimeter or less in the left hemisphere along the margins of the left MCA territory. These could be due to micro embolic infarctions or could be watershed infarctions.  No evidence of swelling or hemorrhage.  The brain appears otherwise normal.  MRA HEAD  Findings: Both internal carotid arteries are widely patent into the brain.  The anterior and middle cerebral vessels are normal without proximal stenosis, aneurysm or vascular malformation.  Both vertebral arteries are patent with the left being dominant.  No basilar stenosis.  Posterior circulation branch vessels are normal.  IMPRESSION: Normal intracranial MR angiography of the large and medium-sized vessels.  This appearance raises the likelihood of micro embolic infarctions over watershed infarctions.  Evaluation of the more proximal vasculature is suggested.   Original Report Authenticated By: Paulina Fusi, M.D.    Microbiology: Recent Results (from the past 240 hour(s))  SURGICAL PCR SCREEN     Status: Abnormal   Collection Time    04/24/13  7:06 AM      Result Value Range Status   MRSA, PCR NEGATIVE  NEGATIVE Final   Staphylococcus aureus POSITIVE (*) NEGATIVE Final   Comment:            The  Xpert SA Assay (FDA     approved for NASAL specimens     in patients over 20 years of age),     is one component of     a comprehensive surveillance     program.  Test performance has     been validated by [employer] for patients greater     than or equal to 80 year old.     It is not intended     to diagnose infection nor to     guide or monitor treatment.  Labs: Basic Metabolic Panel:  Recent Labs Lab 04/21/13 1225  NA 142  K 3.8  CL 104  CO2 24  GLUCOSE 101*  BUN 15  CREATININE 1.00  CALCIUM 10.1   Liver Function Tests:  Recent Labs Lab 04/21/13 1225  AST 20  ALT 28  ALKPHOS 85  BILITOT 0.5  PROT 7.5  ALBUMIN 4.2   No results found for this basename: LIPASE, AMYLASE,  in the last 168 hours No results found for this basename: AMMONIA,  in the last 168 hours CBC:  Recent Labs Lab 04/21/13 1225 04/21/13 1945  WBC 8.0 10.1  NEUTROABS 5.2  --   HGB 15.8 14.9  HCT 46.2 43.5  MCV 86.8 85.8  PLT 185 183   Cardiac Enzymes:  Recent Labs Lab 04/21/13 1225  TROPONINI <0.30   BNP: BNP (last 3 results) No results found for this basename: PROBNP,  in the last 8760 hours CBG:  Recent Labs Lab 04/21/13 1225  GLUCAP 101*       Signed:  Lalana Wachter  [facility] 04/24/2013, 5:19 PM

## 2013-04-24 NOTE — Interval H&P Note (Signed)
History and Physical Interval Note:  04/24/2013 1:23 PM  Justin [name]  has presented today for surgery, with the diagnosis of Syncope  The various methods of treatment have been discussed with the patient and family. After consideration of risks, benefits and other options for treatment, the patient has consented to  Procedure(s): LOOP RECORDER IMPLANT (N/A) as a surgical intervention .  The patient's history has been reviewed, patient examined, no change in status, stable for surgery.  I have reviewed the patient's chart and labs.  Questions were answered to the patient's satisfaction.     Sherryl Manges

## 2013-04-24 NOTE — CV Procedure (Signed)
    Transesophageal Echocardiogram Note  OTT ZIMMERLE [id number] 15-Oct-1951  Procedure: Transesophageal Echocardiogram Indications: TIA / CVA  Procedure Details Consent: Obtained Time Out: Verified patient identification, verified procedure, site/side was marked, verified correct patient position, special equipment/implants available, Radiology Safety Procedures followed,  medications/allergies/relevent history reviewed, required imaging and test results available.  Performed  Medications: Fentanyl: 25 mcg IV Versed: 3 mg IV  Left Ventrical:  Normal LV function  Mitral Valve: normal  Aortic Valve: normal  Tricuspid Valve: normal  Pulmonic Valve: normal, trace PI  Left Atrium/ Left atrial appendage: no thrombus  Atrial septum: + PFO by bubble study.  The atrial septum is very mobile  Aorta: normal.   Complications: No apparent complications Patient did tolerate procedure well.  Discussed with Dr Pearlean Brownie.  He will order venous dopplers to look for DVT.  If there is no evidence of DVT, he would like to have the loop recorder placed.    Vesta Mixer, Montez Hageman., MD, [facility] 04/24/2013, 12:02 PM

## 2013-04-24 NOTE — CV Procedure (Signed)
Pre op Dx cryptogenic stroke, PFO with neg venous dopplers Post op Dx same  Procedure  Loop Recorder implantation  After routine prep and drape of the left parasternal area, a small incision was created. A [employer] LINQ Reveal Loop Recorder  Serial Number  [id number] was inserted.    Steri-Strips were applied.  The patient tolerated the procedure without apparent complication.

## 2013-04-24 NOTE — Progress Notes (Signed)
VASCULAR LAB PRELIMINARY  PRELIMINARY  PRELIMINARY  PRELIMINARY  Bilateral lower extremity venous duplex  completed.    Preliminary report:  Bilateral:  No evidence of DVT, superficial thrombosis, or Baker's Cyst.    Salwa Bai, RVT 04/24/2013, 12:48 PM

## 2013-04-24 NOTE — Interval H&P Note (Signed)
History and Physical Interval Note:  04/24/2013 11:43 AM  Justin [name]  has presented today for surgery, with the diagnosis of STROKE   The various methods of treatment have been discussed with the patient and family. After consideration of risks, benefits and other options for treatment, the patient has consented to  Procedure(s): TRANSESOPHAGEAL ECHOCARDIOGRAM (TEE) (N/A) as a surgical intervention .  The patient's history has been reviewed, patient examined, no change in status, stable for surgery.  I have reviewed the patient's chart and labs.  Questions were answered to the patient's satisfaction.     Elyn Aquas.

## 2013-04-24 NOTE — Progress Notes (Signed)
  Echocardiogram Echocardiogram Transesophageal has been performed.  Justin [name] 04/24/2013, 12:29 PM

## 2013-04-24 NOTE — H&P (View-Only) (Signed)
ELECTROPHYSIOLOGY CONSULT NOTE  Patient ID: Justin [name] MRN: [id number], DOB/AGE: 07/10/1951   Admit date: 04/21/2013 Date of Consult: 04/23/2013  Primary Physician: None Primary Cardiologist: New to [place] Reason for Consultation: Cryptogenic stroke; recommendations regarding Implantable Loop Recorder  History of Present Illness Justin [name] was admitted on 04/21/2013 with acute left MCA CVA. He has been monitored on telemetry which has demonstrated no arrhythmias. No cause has been identified. Stroke work-up is to be completed tomorrow with a TEE. EP has been asked to evaluate for placement of an implantable loop recorder to monitor for atrial fibrillation.  Past Medical History Past Medical History  Diagnosis Date  . Bell's palsy 1980's; 1990's; 2000's; 12/2012; 03/2013    "multiple times" (04/21/2013)  . TIA (transient ischemic attack) 04/21/2013    Past Surgical History Past Surgical History  Procedure Laterality Date  . No past surgeries      Allergies/Intolerances No Known Allergies Inpatient Medications . aspirin  300 mg Rectal Daily   Or  . aspirin  325 mg Oral Daily  . enoxaparin (LOVENOX) injection  40 mg Subcutaneous Q24H  . simvastatin  10 mg Oral q1800   Social History History   Social History  . Marital Status: Married    Spouse Name: N/A    Number of Children: N/A  . Years of Education: N/A   Occupational History  . Not on file.   Social History Main Topics  . Smoking status: Never Smoker   . Smokeless tobacco: Never Used  . Alcohol Use: Yes     Comment: 03/22/2013 "glass of wine couple times/yr"  . Drug Use: No  . Sexually Active: Yes   Other Topics Concern  . Not on file   Social History Narrative  . No narrative on file    Review of Systems General: No chills, fever, night sweats or weight changes  Cardiovascular:  No chest pain, dyspnea on exertion, edema, orthopnea, palpitations, paroxysmal nocturnal dyspnea Dermatological: No rash,  lesions or masses Respiratory: No cough, dyspnea Urologic: No hematuria, dysuria Abdominal: No nausea, vomiting, diarrhea, bright red blood per rectum, melena, or hematemesis Neurologic: No visual changes, weakness, changes in mental status All other systems reviewed and are otherwise negative except as noted above.  Physical Exam Blood pressure 132/79, pulse 88, temperature 98.1 F (36.7 C), temperature source Oral, resp. rate 18, height 5' 9" (1.753 m), weight 167 lb 11.2 oz (76.068 kg), SpO2 94.00%.  General: Well developed, well appearing 62 y.o. male in no acute distress. HEENT: Normocephalic, atraumatic. EOMs intact. Sclera nonicteric. Oropharynx clear.  Neck: Supple. No JVD. Lungs: Respirations regular and unlabored, CTA bilaterally. No wheezes, rales or rhonchi. Heart: RRR. S1, S2 present. No murmurs, rub, S3 or S4. Abdomen: Soft, non-distended.  Extremities: No clubbing, cyanosis or edema. DP/PT/Radials 2+ and equal bilaterally. Psych: Normal affect. Neuro: Alert and oriented X 3. Moves all extremities spontaneously. Musculoskeletal: No kyphosis. Skin: Intact. Warm and dry. No rashes or petechiae in exposed areas.   Labs Lab Results  Component Value Date   WBC 10.1 04/21/2013   HGB 14.9 04/21/2013   HCT 43.5 04/21/2013   MCV 85.8 04/21/2013   PLT 183 04/21/2013    Recent Labs Lab 04/21/13 1225  NA 142  K 3.8  CL 104  CO2 24  BUN 15  CREATININE 1.00  CALCIUM 10.1  PROT 7.5  BILITOT 0.5  ALKPHOS 85  ALT 28  AST 20  GLUCOSE 101*    Recent Labs  04/21/13   1225  INR 0.97    Radiology/Studies Dg Chest 2 View 04/21/2013   IMPRESSION:  1.  No acute cardiopulmonary abnormalities.    Original Report Authenticated By: Taylor Stroud, M.D.   Justin [name] 04/22/2013   IMPRESSION: Several foci of acute infarction measuring a centimeter or less in the left hemisphere along the margins of the left MCA territory. These could be due to micro embolic infarctions or  could be watershed infarctions.  No evidence of swelling or hemorrhage.  The brain appears otherwise normal.  MRA HEAD  Findings: Both internal carotid arteries are widely patent into the brain.  The anterior and middle cerebral vessels are normal without proximal stenosis, aneurysm or vascular malformation.  Both vertebral arteries are patent with the left being dominant.  No basilar stenosis.  Posterior circulation branch vessels are normal.   IMPRESSION: Normal intracranial Justin angiography of the large and medium-sized vessels.  This appearance raises the likelihood of micro embolic infarctions over watershed infarctions.  Evaluation of the more proximal vasculature is suggested.    Original Report Authenticated By: Mark Shogry, M.D.    12-lead ECG on admission shows SR Telemetry shows SR; no arrhythmias   Assessment and Plan 1. Cryptogenic stroke  - acute left MCA CVA, suspected cardioembolic   Justin [name] presents with acute CVA. No source/cause has been identified thus far. Telemetry reveals no arrhythmias. He is scheduled for TEE tomorrow. If this is negative, we have recommended ILR insertion to evaluate for AF. The indication for loop recorder insertion in setting of cryptogenic stroke was discussed with the patient. The loop recorder insertion procedure was reviewed in detail including risks and benefits. These risks include but are not limited to bleeding and infection. The patient expressed verbal understanding and agrees to proceed. The patient was also counseled regarding wound care and device follow-up.  Dr. Allred to see Signed, EDMISTEN, BROOKE 04/23/2013, 4:16 PM  As above Discussed with Dr Sethi, PFO without venous clot  Would like ILR to exclude Afib Have reviewed with Pt and wife who agree and are willing to proceed   

## 2013-04-24 NOTE — Progress Notes (Signed)
Stroke Team Progress Note  HISTORY Justin [name] is an 62 y.o. male with history of recurrent Bell's palsy on the left. Patient was at work 04/21/2013 at 12 PM when he noted his right arm was numb and he could not express himself. He was making sounds but could not make intelligible conversation. This lasted for about 20 minutes and fully resolved. HE was brought to the hospital due to fear he may have had a stroke. He was on no medications prior to hospitalization. Initial CT head was negative but follow up MRI showed Currently he is back to his baseline. Several foci of acute infarction measuring a centimeter or less in the left hemisphere along the margins of the left MCA territory. MRA showed normal large and medium size vessels. Patient was not a TPA candidate secondary to symptom resolution. He was admitted for further evaluation and treatment.  SUBJECTIVE Wife at bedside. Asking questions related to possible TEE results.  OBJECTIVE Most recent Vital Signs: Filed Vitals:   04/23/13 1822 04/23/13 2000 04/24/13 0000 04/24/13 0400  BP: 113/73 117/71 94/56 97/69   Pulse: 81 74 75 85  Temp: 98.5 F (36.9 C) 98.1 F (36.7 C)  97.6 F (36.4 C)  TempSrc: Oral Oral  Oral  Resp: 20     Height:      Weight:    75.705 kg (166 lb 14.4 oz)  SpO2: 94% 98%  96%   CBG (last 3)   Recent Labs  04/21/13 1225  GLUCAP 101*    IV Fluid Intake:   . sodium chloride      MEDICATIONS  . aspirin  300 mg Rectal Daily   Or  . aspirin  325 mg Oral Daily  . enoxaparin (LOVENOX) injection  40 mg Subcutaneous Q24H  . simvastatin  10 mg Oral q1800   PRN:  acetaminophen, hydrALAZINE, senna-docusate  Diet:  NPO  Activity:  Up with assistance DVT Prophylaxis:  Lovenox 40 mg sq daily   CLINICALLY SIGNIFICANT STUDIES Basic Metabolic Panel:   Recent Labs Lab 04/21/13 1225  NA 142  K 3.8  CL 104  CO2 24  GLUCOSE 101*  BUN 15  CREATININE 1.00  CALCIUM 10.1   Liver Function Tests:   Recent  Labs Lab 04/21/13 1225  AST 20  ALT 28  ALKPHOS 85  BILITOT 0.5  PROT 7.5  ALBUMIN 4.2   CBC:   Recent Labs Lab 04/21/13 1225 04/21/13 1945  WBC 8.0 10.1  NEUTROABS 5.2  --   HGB 15.8 14.9  HCT 46.2 43.5  MCV 86.8 85.8  PLT 185 183   Coagulation:   Recent Labs Lab 04/21/13 1225  LABPROT 12.7  INR 0.97   Cardiac Enzymes:   Recent Labs Lab 04/21/13 1225  TROPONINI <0.30   Urinalysis:   Recent Labs Lab 04/21/13 1402  COLORURINE YELLOW  LABSPEC 1.026  PHURINE 6.0  GLUCOSEU NEGATIVE  HGBUR NEGATIVE  BILIRUBINUR NEGATIVE  KETONESUR NEGATIVE  PROTEINUR NEGATIVE  UROBILINOGEN 0.2  NITRITE NEGATIVE  LEUKOCYTESUR NEGATIVE   Lipid Panel    Component Value Date/Time   CHOL 170 04/22/2013 0445   TRIG 107 04/22/2013 0445   HDL 46 04/22/2013 0445   CHOLHDL 3.7 04/22/2013 0445   VLDL 21 04/22/2013 0445   LDLCALC 103* 04/22/2013 0445   HgbA1C  Lab Results  Component Value Date   HGBA1C 5.6 04/22/2013    Urine Drug Screen:     Component Value Date/Time   LABOPIA NONE DETECTED 04/21/2013 1402  COCAINSCRNUR NONE DETECTED 04/21/2013 1402   LABBENZ NONE DETECTED 04/21/2013 1402   AMPHETMU NONE DETECTED 04/21/2013 1402   THCU NONE DETECTED 04/21/2013 1402   LABBARB NONE DETECTED 04/21/2013 1402    Alcohol Level:   Recent Labs Lab 04/21/13 1225  ETH <11   CT of the brain  04/21/2013    No intracranial hemorrhage.  No CT evidence of large acute infarct.  Subtle hypodensity within the posterior limb of the internal capsule slightly more notable on the left. This may represent a normal finding although a small acute infarct is not entirely excluded.    MRI of the brain  04/22/2013    Several foci of acute infarction measuring a centimeter or less in the left hemisphere along the margins of the left MCA territory. These could be due to micro embolic infarctions or could be watershed infarctions.  No evidence of swelling or hemorrhage.  The brain appears otherwise normal.  MRA of the  brain  04/22/2013   Normal intracranial MR angiography of the large and medium-sized vessels.  This appearance raises the likelihood of micro embolic infarctions over watershed infarctions.  Evaluation of the more proximal vasculature is suggested.  2D Echocardiogram  EF 55-60% with no source of embolus.   Carotid Doppler  There is no obvious evidence of hemodynamically significant internal carotid artery stenosis >40%. Vertebral arteries are patent with antegrade flow.  CXR  04/21/2013   No acute cardiopulmonary abnormalities.    EKG   normal sinus rhythm.   Therapy Recommendations no needs  Physical Exam   Frail middle-aged Caucasian pleasant male currently not in distress.Awake alert. Afebrile. Head is nontraumatic. Neck is supple without bruit. Hearing is normal. Cardiac exam no murmur or gallop. Lungs are clear to auscultation. Distal pulses are well felt. Neurological Exam ;  Awake  Alert oriented x 3. Normal speech and language.eye movements full without nystagmus.fundi were not visualized. Vision acuity and fields appear normal. Hearing is normal. Palatal movements are normal. Face symmetric. Tongue midline. Normal strength, tone, reflexes and coordination. Normal sensation. Gait deferred.  ASSESSMENT Mr. Justin [name] is a 62 y.o. male presenting with transient right arm numbness and inability to express himself.  Imaging confirms a left MCA tiny embolic infarcts. Infarct felt to be  embolic secondary to unknown etiology, suspect atrial fibrillation.  On No anti-thrombotics prior to admission. Now on aspirin 325 mg orally every day for secondary stroke prevention. Patient with no resultant neuro- deficits. Work up underway.   History of TIA  History of Bell's palsy x5 Hyperlipidemia, LDL 103, on non statin PTA, now on zocor 10, goal LDL < 100  Hospital day # 3  TREATMENT/PLAN  Continue aspirin 325 mg orally every day for secondary stroke prevention. TEE to look for embolic  source. Arranged with [facility] for today.  If positive for PFO (patent foramen ovale), check bilateral lower extremity venous dopplers to rule out DVT as possible source of stroke.  If TEE negative, [place] cardiologist will place implantable loop recorder to evaluate for atrial fibrillation. This has been explained to patient/family and they are agreeable. I have personally discussed with RN and written order for consent. There is a downtime order form  for TEE placement located in the shadow chart as an electronic order in [facility] is not currently available. Dr. Pearlean Brownie has discussed plan with pt and wife Ok for discharge from neuro standpoint following procedure Follow up Dr. Pearlean Brownie in 2 months.  Annie Main, MSN, RN,  ANVP-BC, ANP-BC, GNP-BC [facility] Pager: [phone] 04/24/2013 9:33 AM  I have personally obtained a history, examined the patient, evaluated imaging results, and formulated the assessment and plan of care. I agree with the above.  Delia Heady, MD

## 2013-04-24 NOTE — H&P (View-Only) (Signed)
Stroke Team Progress Note  HISTORY Justin [name] is an 62 y.o. male with history of recurrent Bell's palsy on the left. Patient was at work 04/21/2013 at 12 PM when he noted his right arm was numb and he could not express himself. He was making sounds but could not make intelligible conversation. This lasted for about 20 minutes and fully resolved. HE was brought to the hospital due to fear he may have had a stroke. He was on no medications prior to hospitalization. Initial CT head was negative but follow up MRI showed Currently he is back to his baseline. Several foci of acute infarction measuring a centimeter or less in the left hemisphere along the margins of the left MCA territory. MRA showed normal large and medium size vessels. Patient was not a TPA candidate secondary to symptom resolution. He was admitted for further evaluation and treatment.  SUBJECTIVE His wife is at the bedside.  Overall he feels his condition is completely resolved.   OBJECTIVE Most recent Vital Signs: Filed Vitals:   04/22/13 1230 04/22/13 2000 04/23/13 0000 04/23/13 0400  BP: 131/86 128/76 101/65 104/63  Pulse: 81 74 67 61  Temp: 98.8 F (37.1 C) 97.9 F (36.6 C) 98 F (36.7 C) 98.5 F (36.9 C)  TempSrc: Oral Oral Oral Oral  Resp: 17 18 18    Height:      Weight:    76.068 kg (167 lb 11.2 oz)  SpO2: 97% 97% 96% 98%   CBG (last 3)   Recent Labs  04/21/13 1225  GLUCAP 101*    IV Fluid Intake:     MEDICATIONS  . aspirin  300 mg Rectal Daily   Or  . aspirin  325 mg Oral Daily  . enoxaparin (LOVENOX) injection  40 mg Subcutaneous Q24H  . simvastatin  10 mg Oral q1800   PRN:  acetaminophen, hydrALAZINE, senna-docusate  Diet:  General thin liquids Activity:  Bedrest DVT Prophylaxis:  Lovenox 40 mg sq daily   CLINICALLY SIGNIFICANT STUDIES Basic Metabolic Panel:  Recent Labs Lab 04/21/13 1225  NA 142  K 3.8  CL 104  CO2 24  GLUCOSE 101*  BUN 15  CREATININE 1.00  CALCIUM 10.1   Liver  Function Tests:  Recent Labs Lab 04/21/13 1225  AST 20  ALT 28  ALKPHOS 85  BILITOT 0.5  PROT 7.5  ALBUMIN 4.2   CBC:  Recent Labs Lab 04/21/13 1225 04/21/13 1945  WBC 8.0 10.1  NEUTROABS 5.2  --   HGB 15.8 14.9  HCT 46.2 43.5  MCV 86.8 85.8  PLT 185 183   Coagulation:  Recent Labs Lab 04/21/13 1225  LABPROT 12.7  INR 0.97   Cardiac Enzymes:  Recent Labs Lab 04/21/13 1225  TROPONINI <0.30   Urinalysis:  Recent Labs Lab 04/21/13 1402  COLORURINE YELLOW  LABSPEC 1.026  PHURINE 6.0  GLUCOSEU NEGATIVE  HGBUR NEGATIVE  BILIRUBINUR NEGATIVE  KETONESUR NEGATIVE  PROTEINUR NEGATIVE  UROBILINOGEN 0.2  NITRITE NEGATIVE  LEUKOCYTESUR NEGATIVE   Lipid Panel    Component Value Date/Time   CHOL 170 04/22/2013 0445   TRIG 107 04/22/2013 0445   HDL 46 04/22/2013 0445   CHOLHDL 3.7 04/22/2013 0445   VLDL 21 04/22/2013 0445   LDLCALC 103* 04/22/2013 0445   HgbA1C  Lab Results  Component Value Date   HGBA1C 5.6 04/22/2013    Urine Drug Screen:     Component Value Date/Time   LABOPIA NONE DETECTED 04/21/2013 1402   COCAINSCRNUR NONE  DETECTED 04/21/2013 1402   LABBENZ NONE DETECTED 04/21/2013 1402   AMPHETMU NONE DETECTED 04/21/2013 1402   THCU NONE DETECTED 04/21/2013 1402   LABBARB NONE DETECTED 04/21/2013 1402    Alcohol Level:  Recent Labs Lab 04/21/13 1225  ETH <11   CT of the brain  04/21/2013    No intracranial hemorrhage.  No CT evidence of large acute infarct.  Subtle hypodensity within the posterior limb of the internal capsule slightly more notable on the left. This may represent a normal finding although a small acute infarct is not entirely excluded.    MRI of the brain  04/22/2013    Several foci of acute infarction measuring a centimeter or less in the left hemisphere along the margins of the left MCA territory. These could be due to micro embolic infarctions or could be watershed infarctions.  No evidence of swelling or hemorrhage.  The brain appears otherwise  normal.  MRA of the brain  04/22/2013   Normal intracranial MR angiography of the large and medium-sized vessels.  This appearance raises the likelihood of micro embolic infarctions over watershed infarctions.  Evaluation of the more proximal vasculature is suggested.  2D Echocardiogram  EF 55-60% with no source of embolus.   Carotid Doppler  There is no obvious evidence of hemodynamically significant internal carotid artery stenosis >40%. Vertebral arteries are patent with antegrade flow.  CXR  04/21/2013   No acute cardiopulmonary abnormalities.    EKG   normal sinus rhythm.   Therapy Recommendations no needs  Physical Exam   Frail middle-aged Caucasian pleasant male currently not in distress.Awake alert. Afebrile. Head is nontraumatic. Neck is supple without bruit. Hearing is normal. Cardiac exam no murmur or gallop. Lungs are clear to auscultation. Distal pulses are well felt. Neurological Exam ;  Awake  Alert oriented x 3. Normal speech and language.eye movements full without nystagmus.fundi were not visualized. Vision acuity and fields appear normal. Hearing is normal. Palatal movements are normal. Face symmetric. Tongue midline. Normal strength, tone, reflexes and coordination. Normal sensation. Gait deferred. ASSESSMENT Mr. Justin [name] is a 62 y.o. male presenting with transient right arm numbness and inability to express himself.  Imaging confirms a left MCA tiny embolic infarcts. Infarct felt to be  embolic secondary to unknown etiology, suspect atrial fibrillation.  On No anti-thrombotics prior to admission. Now on aspirin 325 mg orally every day for secondary stroke prevention. Patient with no resultant neuro- deficits. Work up underway.   History of TIA  History of Bell's palsy x5  Hospital day # 2  TREATMENT/PLAN  Continue aspirin 325 mg orally every day for secondary stroke prevention. TEE to look for embolic source. Arranged with [facility] Cardiology for tomorrow.  If  positive for PFO (patent foramen ovale), check bilateral lower extremity venous dopplers to rule out DVT as possible source of stroke.  If TEE negative,  cardiologist will place implantable loop recorder to evaluate for atrial fibrillation. This has been explained to patient/family and they are agreeable. I have personally discussed with RN and written order for consent. There is a downtime order form  for TEE placement located in the shadow chart as an electronic order in [facility] is not currently available. Dr. Pearlean Brownie has discussed plan with Dr. Blake Divine and Dr. Birdie Hopes, MSN, RN, ANVP-BC, ANP-BC, GNP-BC [facility] Pager: [phone] 04/23/2013 9:22 AM  I have personally obtained a history, examined the patient, evaluated imaging results, and formulated the assessment and plan of  care. I agree with the above. Delia Heady, MD

## 2013-04-24 NOTE — Progress Notes (Signed)
Dr. Pearlean Brownie spoke with Dr. Elease Hashimoto - patient with PFO. Will check stat LE dopplers, if negative, would go ahead with implantable loop recorder.  RN to call vascular to have them do study in recovery if they are able.  Annie Main, MSN, RN, ANVP-BC, ANP-BC, [facility] Pager: [phone] 04/24/2013 12:33 PM  I have personally obtained a history, examined the patient, evaluated imaging results, and formulated the assessment and plan of care. I agree with the above.

## 2013-04-26 NOTE — Discharge Summary (Signed)
Kathlen Mody, MD Physician Signed Internal Medicine Progress Notes Service date: 04/24/2013 4:10 PM   Physician Discharge Summary   Justin [name] ION:[id number] DOB: 1951/03/02 DOA: 04/21/2013  PCP: No PCP Per Patient  Admit date: 04/21/2013  Discharge date: 04/24/2013  Time spent: 30 minutes  Recommendations for Outpatient Follow-up:  1. Follow up with neurology 2. Follow up with PCP as recommended.  3. Follow up with Dr Graciela Husbands as recommended   Discharge Diagnoses:  Active Problems:  CVA (cerebral infarction)  Bell's palsy   Discharge Condition: improved.  Diet recommendation: regular  Filed Weights    04/21/13 1652  04/23/13 0400  04/24/13 0400   Weight:  83.915 kg (185 lb)  76.068 kg (167 lb 11.2 oz)  75.705 kg (166 lb 14.4 oz)    History of present illness:  62 year old male with a history of Bell's palsy, recently treated for a URI with prednisone and azithromycin, who presents to [facility] because of inability to speak lasting about 20 minutes. Prior to his episode the patient started experiencing numbness and tingling in his right arm. He denied any chest pain any shortness of breath. 1980s he had an episode of Bell's palsy, with an exacerbation 2 weeks ago. The patient is a nonsmoker, denies any prior history of CVA, . He was admitted for evaluation of CVA.  His MRI showed multiple strokes, and neurology consulted, strarted him on 325mg  of aspirin. He underwent TEE, SHOWED no thrombus, but PFO, venous duplex neg for DVT. He underwent a loop recorder placed. And is being discharged home.  Hospital Course:  4. Acute CVA:  - ASPIRIN 325 mg daily. carotid dulplex did not show significant stenosis. Echo showed good LVEF of 55% no wall motion abnormalities, and grade 1 diastolic dysfunction,. He underwent TEE, showed no thrombus. Had a pfo, underwent venous duplex No DVT. He underwent a loop recorder implantation. LDL is 109, hgba1c is 5.6.  2. Bell's Palsy: stable.   Procedures:  Loop recorder placement.  Consultations:  Cardiology for TEE  Neurology Discharge Exam:  Filed Vitals:    04/24/13 1250  04/24/13 1300  04/24/13 1310  04/24/13 1500   BP:  110/65  110/65  116/72  133/87   Pulse:     83   Temp:     97.5 F (36.4 C)   TempSrc:     Oral   Resp:  18  27  21  20    Height:       Weight:       SpO2:  92%  91%  94%  100%    General: Alert afebrile comfortable  Cardiovascular: s1s2 RRR  Respiratory: ctab  Abdomen: soft NT ND BS+  Musculoskeletal: no pedal edema  Discharge Instructions      Discharge Orders    Future Appointments  Provider  Department  Dept Phone    04/30/2013 11:00 AM  [facility]  [place] [employer] Main Office [place])  [phone]    Future Orders  Complete By  Expires     Discharge instructions  As directed      Comments:     Follow up with PCP and neurologist as recommended.     Increase activity slowly  As directed          Medication List         aspirin 325 MG tablet    Take 1 tablet (325 mg total) by mouth daily.    simvastatin 10 MG tablet  Commonly known as: ZOCOR    Take 1 tablet (10 mg total) by mouth daily at 6 PM.      No Known Allergies  Follow-up Information    Follow up with Gates Rigg, MD. Schedule an appointment as soon as possible for a visit in 2 months. (stroke clinic)    Contact information:    [address]  [address]  [place] [address]  [phone]       Follow up with No PCP Per Patient. Schedule an appointment as soon as possible for a visit in 1 week.    Contact information:    [address]  [place] [address]  [phone]       Follow up with [facility] On 04/30/2013. (At 11:00 AM for wound check and follow-up after loop recorder insertion)    Contact information:    [address]  [address]  [place] [address]  [phone]       The results of significant diagnostics from this hospitalization (including imaging,  microbiology, ancillary and laboratory) are listed below for reference.   Significant Diagnostic Studies:  Dg Chest 2 View  04/21/2013 *RADIOLOGY REPORT* Clinical Data: Stroke CHEST - 2 VIEW Comparison: None Findings: Heart size is normal. No pleural effusion or edema. No airspace consolidation identified. Review of the visualized osseous structures is unremarkable. IMPRESSION: 1. No acute cardiopulmonary abnormalities. Original Report Authenticated By: Signa Kell, M.D.  Ct Head Wo Contrast  04/21/2013 *RADIOLOGY REPORT* Clinical Data: Tingling right hand and difficulty with speech. Symptoms now resolved. CT HEAD WITHOUT CONTRAST Technique: Contiguous axial images were obtained from the base of the skull through the vertex without contrast. Comparison: None. Findings: No intracranial hemorrhage. No CT evidence of large acute infarct. Subtle hypodensity within the posterior limb of the internal capsule slightly more notable on the left. Small acute infarct not entirely excluded although this may be entirely within normal limits. No intracranial mass lesion detected on this unenhanced exam. No hydrocephalus. Mastoid air cells, middle ear cavities and visualized sinuses are clear. IMPRESSION: No intracranial hemorrhage. No CT evidence of large acute infarct. Subtle hypodensity within the posterior limb of the internal capsule slightly more notable on the left. This may represent a normal finding although a small acute infarct is not entirely excluded. Results discussed with Dr. Rubin Payor 04/21/2013 12:48 p.m. Original Report Authenticated By: Lacy Duverney, M.D.  Mr Brain Wo Contrast  04/22/2013 *RADIOLOGY REPORT* Clinical Data: Tingling of the right hand. Speech disturbance. MRI HEAD WITHOUT CONTRAST MRA HEAD WITHOUT CONTRAST Technique: Multiplanar, multiecho pulse sequences of the brain and surrounding structures were obtained without intravenous contrast. Angiographic images of the head were obtained using MRA  technique without contrast. Comparison: Head CT same day MRI HEAD Findings: There are four or five sub-centimeter acute infarctions in the left hemisphere in the periphery of the brain at the parietal occipital junction and in the parietal region at the vertex. These could be of micro embolic infarctions or could be watershed infarctions along the margins of the left MCA territory. No other acute infarction. The brainstem and cerebellum are normal. The cerebral hemispheres are otherwise normal without evidence of other old or acute infarction, mass lesion, hemorrhage, hydrocephalus or extra-axial collection. No pituitary mass. No inflammatory sinus disease. No skull or skull base lesion. IMPRESSION: Several foci of acute infarction measuring a centimeter or less in the left hemisphere along the margins of the left MCA territory. These could be due to micro  embolic infarctions or could be watershed infarctions. No evidence of swelling or hemorrhage. The brain appears otherwise normal. MRA HEAD Findings: Both internal carotid arteries are widely patent into the brain. The anterior and middle cerebral vessels are normal without proximal stenosis, aneurysm or vascular malformation. Both vertebral arteries are patent with the left being dominant. No basilar stenosis. Posterior circulation branch vessels are normal. IMPRESSION: Normal intracranial MR angiography of the large and medium-sized vessels. This appearance raises the likelihood of micro embolic infarctions over watershed infarctions. Evaluation of the more proximal vasculature is suggested. Original Report Authenticated By: Paulina Fusi, M.D.  Mr Mra Head/brain Wo Cm  04/22/2013 *RADIOLOGY REPORT* Clinical Data: Tingling of the right hand. Speech disturbance. MRI HEAD WITHOUT CONTRAST MRA HEAD WITHOUT CONTRAST Technique: Multiplanar, multiecho pulse sequences of the brain and surrounding structures were obtained without intravenous contrast. Angiographic images  of the head were obtained using MRA technique without contrast. Comparison: Head CT same day MRI HEAD Findings: There are four or five sub-centimeter acute infarctions in the left hemisphere in the periphery of the brain at the parietal occipital junction and in the parietal region at the vertex. These could be of micro embolic infarctions or could be watershed infarctions along the margins of the left MCA territory. No other acute infarction. The brainstem and cerebellum are normal. The cerebral hemispheres are otherwise normal without evidence of other old or acute infarction, mass lesion, hemorrhage, hydrocephalus or extra-axial collection. No pituitary mass. No inflammatory sinus disease. No skull or skull base lesion. IMPRESSION: Several foci of acute infarction measuring a centimeter or less in the left hemisphere along the margins of the left MCA territory. These could be due to micro embolic infarctions or could be watershed infarctions. No evidence of swelling or hemorrhage. The brain appears otherwise normal. MRA HEAD Findings: Both internal carotid arteries are widely patent into the brain. The anterior and middle cerebral vessels are normal without proximal stenosis, aneurysm or vascular malformation. Both vertebral arteries are patent with the left being dominant. No basilar stenosis. Posterior circulation branch vessels are normal. IMPRESSION: Normal intracranial MR angiography of the large and medium-sized vessels. This appearance raises the likelihood of micro embolic infarctions over watershed infarctions. Evaluation of the more proximal vasculature is suggested. Original Report Authenticated By: Paulina Fusi, M.D.  Microbiology:  Recent Results (from the past 240 hour(s))   SURGICAL PCR SCREEN Status: Abnormal    Collection Time    04/24/13 7:06 AM   Result  Value  Range  Status    MRSA, PCR  NEGATIVE  NEGATIVE  Final    Staphylococcus aureus  POSITIVE (*)  NEGATIVE  Final    Comment:       The Xpert SA Assay (FDA     approved for NASAL specimens     in patients over 21 years of age),     is one component of     a comprehensive surveillance     program. Test performance has     been validated by [employer] for patients greater     than or equal to 44 year old.     It is not intended     to diagnose infection nor to     guide or monitor treatment.    Labs:  Basic Metabolic Panel:   Recent Labs  Lab  04/21/13 1225   NA  142   K  3.8   CL  104   CO2  24  GLUCOSE  101*   BUN  15   CREATININE  1.00   CALCIUM  10.1    Liver Function Tests:   Recent Labs  Lab  04/21/13 1225   AST  20   ALT  28   ALKPHOS  85   BILITOT  0.5   PROT  7.5   ALBUMIN  4.2    No results found for this basename: LIPASE, AMYLASE, in the last 168 hours  No results found for this basename: AMMONIA, in the last 168 hours  CBC:   Recent Labs  Lab  04/21/13 1225  04/21/13 1945   WBC  8.0  10.1   NEUTROABS  5.2  --   HGB  15.8  14.9   HCT  46.2  43.5   MCV  86.8  85.8   PLT  185  183    Cardiac Enzymes:   Recent Labs  Lab  04/21/13 1225   TROPONINI  <0.30    BNP:  BNP (last 3 results)  No results found for this basename: PROBNP, in the last 8760 hours  CBG:   Recent Labs  Lab  04/21/13 1225   GLUCAP  101*    Signed:  Decklyn Hyder  [facility]  04/24/2013, 5:19 PM

## 2013-04-27 ENCOUNTER — Encounter (HOSPITAL_COMMUNITY): Payer: Self-pay | Admitting: Cardiovascular Disease

## 2013-04-30 ENCOUNTER — Ambulatory Visit (INDEPENDENT_AMBULATORY_CARE_PROVIDER_SITE_OTHER): Payer: Managed Care, Other (non HMO) | Admitting: *Deleted

## 2013-04-30 DIAGNOSIS — R55 Syncope and collapse: Secondary | ICD-10-CM

## 2013-04-30 LAB — PACEMAKER DEVICE OBSERVATION

## 2013-04-30 NOTE — Progress Notes (Signed)
Pt seen in device clinic for follow up of recently implanted ILR.  Wound well healed.  No redness, swelling, or edema.  Steri-strips removed today.   Device interrogated and found to be functioning normally.  No tachy, brady, or asystole episodes. See PaceArt for full details.  Pt denies chest pain, shortness of breath, palpitations, or dizziness.  Pt to follow up with Dr. Graciela Husbands in 3 months.   Nashya Garlington 04/30/2013 11:43 AM

## 2013-05-11 ENCOUNTER — Ambulatory Visit (INDEPENDENT_AMBULATORY_CARE_PROVIDER_SITE_OTHER): Payer: Managed Care, Other (non HMO) | Admitting: Family Medicine

## 2013-05-11 ENCOUNTER — Encounter: Payer: Self-pay | Admitting: Family Medicine

## 2013-05-11 VITALS — BP 120/78 | HR 78 | Temp 97.4°F | Resp 18 | Ht 67.0 in | Wt 172.0 lb

## 2013-05-11 DIAGNOSIS — G510 Bell's palsy: Secondary | ICD-10-CM

## 2013-05-11 DIAGNOSIS — I639 Cerebral infarction, unspecified: Secondary | ICD-10-CM

## 2013-05-11 DIAGNOSIS — E785 Hyperlipidemia, unspecified: Secondary | ICD-10-CM | POA: Insufficient documentation

## 2013-05-11 DIAGNOSIS — Z1211 Encounter for screening for malignant neoplasm of colon: Secondary | ICD-10-CM | POA: Insufficient documentation

## 2013-05-11 DIAGNOSIS — I6350 Cerebral infarction due to unspecified occlusion or stenosis of unspecified cerebral artery: Secondary | ICD-10-CM

## 2013-05-11 NOTE — Assessment & Plan Note (Signed)
Refer for colon cancer screen

## 2013-05-11 NOTE — Assessment & Plan Note (Signed)
LDL just above goal o f< 100, with recent CVA, continue statin drug Plan to recheck labs next visit Form completed for job

## 2013-05-11 NOTE — Progress Notes (Signed)
  Subjective:    Patient ID: Justin [name], male    DOB: 1951-09-15, 62 y.o.   MRN: [id number]  HPI  Pt here to establish care, no recent PCP, was seen at [facility] in the past.  Diagnosed with cerbral infarct 2 weeks ago, had acute change in speech and weakness. Was evaluated at [facility], MRI showed areas of infarct. No residual symptoms. Had TEE, ECHO, [place] LE which were unremarkable. Currently wearing loop monitor to pick up any arrthymia.  H/O Bell's palsy in the past, typically on left side, responds well to prednisone and ?antibiotics Was evaluated by Dr. Andrey Campanile ENT in past for this. No specific cause found. Medications and labs reviewed  No colonoscopy Review of Systems   GEN- denies fatigue, fever, weight loss,weakness, recent illness HEENT- denies eye drainage, change in vision, nasal discharge, CVS- denies chest pain, palpitations RESP- denies SOB, cough, wheeze ABD- denies N/V, change in stools, abd pain GU- denies dysuria, hematuria, dribbling, incontinence MSK- denies joint pain, muscle aches, injury Neuro- denies headache, dizziness, syncope, seizure activity      Objective:   Physical Exam GEN- NAD, alert and oriented x3 HEENT- PERRL, EOMI, non injected sclera, pink conjunctiva, MMM, oropharynx clear Neck- Supple, no bruit CVS- RRR, no murmur, loop recorder left chest wall RESP-CTAB EXT- No edema Pulses- Radial, DP- 2+ NEURO-CNII-XII in tact, no focal deficits       Assessment & Plan:

## 2013-05-11 NOTE — Assessment & Plan Note (Signed)
Recurrent episodes will monitor, pt knows symptoms very well

## 2013-05-11 NOTE — Patient Instructions (Addendum)
Referral for colonoscopy  Form completed  F/U 4 months

## 2013-05-11 NOTE — Assessment & Plan Note (Signed)
No residual deficits F/U with neurology Loop recorder in tact, so fart no arrythmia found Continue full dose ASA and statin drug

## 2013-05-14 ENCOUNTER — Telehealth: Payer: Self-pay

## 2013-05-22 ENCOUNTER — Encounter: Payer: Self-pay | Admitting: Internal Medicine

## 2013-05-26 NOTE — Telephone Encounter (Signed)
LMOM today for a return call.

## 2013-05-26 NOTE — Telephone Encounter (Signed)
LATE ENTRY: LMOM to call on 05/14/2013.

## 2013-06-01 ENCOUNTER — Telehealth: Payer: Self-pay

## 2013-06-01 NOTE — Telephone Encounter (Signed)
Pt has not responded to messages/letter.

## 2013-06-01 NOTE — Telephone Encounter (Signed)
noted 

## 2013-06-02 ENCOUNTER — Other Ambulatory Visit: Payer: Self-pay

## 2013-06-02 DIAGNOSIS — Z1211 Encounter for screening for malignant neoplasm of colon: Secondary | ICD-10-CM

## 2013-06-02 NOTE — Telephone Encounter (Signed)
Gastroenterology Pre-Procedure Review  Request Date: 06/02/2013 Requesting Physician: Dr.[place]    Does he need OV prior to colonoscopy?  HAD SOME TIA'S IN July 2014. DOING WELL NOW.  HAS LOOP RECORDER EMBEDDED UNDER SKIN LOOKING FOR A-FIB  PATIENT REVIEW QUESTIONS: The patient responded to the following health history questions as indicated:    1. Diabetes Melitis: no 2. Joint replacements in the past 12 months: no 3. Major health problems in the past 3 months: HAD SOME TIA'S IN July 2014/ DOING WELL NOW  4. Has an artificial valve or MVP: no 5. Has a defibrillator: no 6. Has been advised in past to take antibiotics in advance of a procedure like teeth cleaning: no    MEDICATIONS & ALLERGIES:    Patient reports the following regarding taking any blood thinners:   Plavix? no Aspirin? yes Coumadin? no  Patient confirms/reports the following medications:  Current Outpatient Prescriptions  Medication Sig Dispense Refill  . aspirin 325 MG tablet Take 1 tablet (325 mg total) by mouth daily.  30 tablet  1  . simvastatin (ZOCOR) 10 MG tablet Take 1 tablet (10 mg total) by mouth daily at 6 PM.  30 tablet  1   No current facility-administered medications for this visit.    Patient confirms/reports the following allergies:  No Known Allergies  No orders of the defined types were placed in this encounter.    AUTHORIZATION INFORMATION Primary Insurance:   ID #:  Group #:  Pre-Cert / Auth required:  Pre-Cert / Auth #:   Secondary Insurance:   ID #:  Group #:  Pre-Cert / Auth required: Pre-Cert / Auth #:    SCHEDULE INFORMATION: Procedure has been scheduled as follows:  Date: 07/13/2013   Time: 8:30 AM  Location: [facility] Short Stay  This Gastroenterology Pre-Precedure Review Form is being routed to the following provider(s): Jonette Eva, MD

## 2013-06-02 NOTE — Telephone Encounter (Signed)
Returned pt's Vm call. LMOM for a return call.

## 2013-06-03 NOTE — Telephone Encounter (Signed)
MOVI PREP SPLIT DOSING, CLEAR LIQUIDS WITH BREAKFAST. NO OPV NEEDED.

## 2013-06-04 ENCOUNTER — Other Ambulatory Visit: Payer: Self-pay

## 2013-06-04 DIAGNOSIS — Z1211 Encounter for screening for malignant neoplasm of colon: Secondary | ICD-10-CM

## 2013-06-04 MED ORDER — PEG-KCL-NACL-NASULF-NA ASC-C 100 G PO SOLR: 1.0000 | ORAL | Status: DC

## 2013-06-04 NOTE — Telephone Encounter (Signed)
Rx was sent to the pharmacy and instructions mailed to pt.  

## 2013-06-09 ENCOUNTER — Telehealth: Payer: Self-pay

## 2013-06-09 NOTE — Telephone Encounter (Signed)
I called [employer] at [phone] and spoke to [facility] Q who said that a PA is not required for screening colonoscopy.

## 2013-06-25 ENCOUNTER — Encounter: Payer: Self-pay | Admitting: Family Medicine

## 2013-06-26 ENCOUNTER — Other Ambulatory Visit: Payer: Self-pay | Admitting: Family Medicine

## 2013-06-26 MED ORDER — SIMVASTATIN 10 MG PO TABS: 10.0000 mg | ORAL_TABLET | Freq: Every day | ORAL | Status: DC

## 2013-06-30 ENCOUNTER — Telehealth: Payer: Self-pay

## 2013-06-30 NOTE — Telephone Encounter (Signed)
REVIEWED.  

## 2013-06-30 NOTE — Telephone Encounter (Signed)
I called to update triage and spoke to pt's wife who said pt has not had any new medical problems and no change in his medications.

## 2013-07-01 ENCOUNTER — Encounter (HOSPITAL_COMMUNITY): Payer: Self-pay | Admitting: Pharmacy Technician

## 2013-07-13 ENCOUNTER — Encounter (HOSPITAL_COMMUNITY)
Admission: RE | Disposition: A | Discharge status: Home / Self Care | Payer: Self-pay | Source: Ambulatory Visit | Attending: Gastroenterology

## 2013-07-13 ENCOUNTER — Encounter (HOSPITAL_COMMUNITY): Payer: Self-pay | Admitting: *Deleted

## 2013-07-13 ENCOUNTER — Ambulatory Visit (HOSPITAL_COMMUNITY)
Admission: RE | Admit: 2013-07-13 | Discharge: 2013-07-13 | Disposition: A | Discharge status: Home / Self Care | Payer: Managed Care, Other (non HMO) | Source: Ambulatory Visit | Attending: Gastroenterology | Admitting: Gastroenterology

## 2013-07-13 DIAGNOSIS — K648 Other hemorrhoids: Secondary | ICD-10-CM | POA: Insufficient documentation

## 2013-07-13 DIAGNOSIS — Z1211 Encounter for screening for malignant neoplasm of colon: Secondary | ICD-10-CM | POA: Insufficient documentation

## 2013-07-13 DIAGNOSIS — K5730 Diverticulosis of large intestine without perforation or abscess without bleeding: Secondary | ICD-10-CM

## 2013-07-13 DIAGNOSIS — D126 Benign neoplasm of colon, unspecified: Secondary | ICD-10-CM | POA: Insufficient documentation

## 2013-07-13 HISTORY — PX: COLONOSCOPY: SHX5424

## 2013-07-13 HISTORY — DX: Pure hypercholesterolemia, unspecified: E78.00

## 2013-07-13 SURGERY — COLONOSCOPY
Anesthesia: Moderate Sedation

## 2013-07-13 MED ORDER — MIDAZOLAM HCL 5 MG/5ML IJ SOLN
INTRAMUSCULAR | Status: AC
  Filled 2013-07-13: qty 10

## 2013-07-13 MED ORDER — STERILE WATER FOR IRRIGATION IR SOLN
Status: DC | PRN
  Administered 2013-07-13: 09:00:00

## 2013-07-13 MED ORDER — MEPERIDINE HCL 100 MG/ML IJ SOLN
INTRAMUSCULAR | Status: DC | PRN
  Administered 2013-07-13 (×2): 25 mg via INTRAVENOUS

## 2013-07-13 MED ORDER — MIDAZOLAM HCL 5 MG/5ML IJ SOLN
INTRAMUSCULAR | Status: DC | PRN
  Administered 2013-07-13 (×2): 1 mg via INTRAVENOUS
  Administered 2013-07-13: 2 mg via INTRAVENOUS

## 2013-07-13 MED ORDER — MEPERIDINE HCL 100 MG/ML IJ SOLN
INTRAMUSCULAR | Status: AC
  Filled 2013-07-13: qty 2

## 2013-07-13 MED ORDER — SODIUM CHLORIDE 0.9 % IV SOLN
INTRAVENOUS | Status: DC
  Administered 2013-07-13: 08:00:00 via INTRAVENOUS

## 2013-07-13 NOTE — H&P (Signed)
  Primary Care Physician:  Milinda Antis, MD Primary Gastroenterologist:  Dr. Darrick Penna  Pre-Procedure History & Physical: HPI:  Justin [name] is a 62 y.o. male here for COLON CANCER SCREENING.  Past Medical History  Diagnosis Date  . Bell's palsy 1980's; [id number]; 2000's; 12/2012; 03/2013    "multiple times" (04/21/2013)  . TIA (transient ischemic attack) 04/21/2013  . Stroke   . Hypercholesteremia     Past Surgical History  Procedure Laterality Date  . Tee without cardioversion N/A 04/24/2013    Procedure: TRANSESOPHAGEAL ECHOCARDIOGRAM (TEE);  Surgeon: Vesta Mixer, MD;  Location: [facility] ENDOSCOPY;  Service: Cardiovascular;  Laterality: N/A;  . Implanted loop recorder      Prior to Admission medications   Medication Sig Start Date End Date Taking? Authorizing Provider  aspirin 325 MG tablet Take 1 tablet (325 mg total) by mouth daily. 04/24/13  Yes Kathlen Mody, MD  peg 3350 powder (MOVIPREP) 100 G SOLR Take 1 kit (200 g total) by mouth as directed. 06/04/13  Yes West Bali, MD  simvastatin (ZOCOR) 10 MG tablet Take 1 tablet (10 mg total) by mouth at bedtime. 06/26/13  Yes Salley Scarlet, MD    Allergies as of 06/02/2013  . (No Known Allergies)    Family History  Problem Relation Age of Onset  . Heart disease Mother   . Alcohol abuse Mother   . Colon cancer Neg Hx     History   Social History  . Marital Status: Married    Spouse Name: N/A    Number of Children: N/A  . Years of Education: N/A   Occupational History  . Not on file.   Social History Main Topics  . Smoking status: Never Smoker   . Smokeless tobacco: Never Used  . Alcohol Use: Yes     Comment: 03/22/2013 "glass of wine couple times/yr"  . Drug Use: No  . Sexual Activity: Yes   Other Topics Concern  . Not on file   Social History Narrative  . No narrative on file    Review of Systems: See HPI, otherwise negative ROS   Physical Exam: BP 145/102  Pulse 80  Temp(Src) 97.6 F (36.4 C)  (Oral)  Resp 18  Ht 5\' 9"  (1.753 m)  Wt 172 lb (78.019 kg)  BMI 25.39 kg/m2  SpO2 98% General:   Alert,  pleasant and cooperative in NAD Head:  Normocephalic and atraumatic. Neck:  Supple; Lungs:  Clear throughout to auscultation.    Heart:  Regular rate and rhythm. Abdomen:  Soft, nontender and nondistended. Normal bowel sounds, without guarding, and without rebound.   Neurologic:  Alert and  oriented x4;  grossly normal neurologically.  Impression/Plan:     SCREENING  Plan:  1. TCS TODAY

## 2013-07-13 NOTE — Op Note (Signed)
[facility] [address] [place], [address]   COLONOSCOPY PROCEDURE REPORT  PATIENT: Justin [name], Justin [name]  MR#: [id number] BIRTHDATE: November 27, 1950 , 62  yrs. old GENDER: Male ENDOSCOPIST: Jonette Eva, MD REFERRED XB:[id number] [place], M.D. PROCEDURE DATE:  07/13/2013 PROCEDURE:   Colonoscopy with cold biopsy polypectomy INDICATIONS:Average risk patient for colon cancer. MEDICATIONS: Demerol-Detailed  DESCRIPTION OF PROCEDURE:    Physical exam was performed.  Informed consent was obtained from the patient after explaining the benefits, risks, and alternatives to procedure.  The patient was connected to monitor and placed in left lateral position. Continuous oxygen was provided by nasal cannula and IV medicine administered through an indwelling cannula.  After administration of sedation and rectal exam, the patients rectum was intubated and the EC-[id number] ([id number])  colonoscope was advanced under direct visualization to the ileum.  The scope was removed slowly by carefully examining the color, texture, anatomy, and integrity mucosa on the way out.  The patient was recovered in endoscopy and discharged home in satisfactory condition.    COLON FINDINGS: NORMAL ILEUM. A sessile polyp measuring 3 mm in size was found in the sigmoid colon.  A polypectomy was performed with cold forceps.  , There was moderate diverticulosis noted in the sigmoid colon with associated muscular hypertrophy.  , and Small internal and MODERATE external hemorrhoids were found.  PREP QUALITY: good.  CECAL W/D TIME: 14 minutes     COMPLICATIONS: None  ENDOSCOPIC IMPRESSION: 1.   ONE COLON POLYP REMOVED in the sigmoid colon 2.  Moderate diverticulosis  in the sigmoid colon 3.   Small internal hemorrhoids  RECOMMENDATIONS: AWAIT BIOPSY HIGH FIBER DIET TCS IN 10 YEARS       _______________________________ eSignedJonette Eva, MD 07/13/2013 9:20 AM

## 2013-07-16 ENCOUNTER — Encounter (HOSPITAL_COMMUNITY): Payer: Self-pay | Admitting: Gastroenterology

## 2013-07-22 ENCOUNTER — Telehealth: Payer: Self-pay | Admitting: Gastroenterology

## 2013-07-22 NOTE — Telephone Encounter (Signed)
Please call pt. He had A simple adenoma removed from hIS colon. FOLLOW A High fiber diet. TCS IN 10 YEARS. 

## 2013-07-23 NOTE — Telephone Encounter (Signed)
Pts wife is aware of results.

## 2013-07-23 NOTE — Telephone Encounter (Signed)
Tried to call with no answer  

## 2013-07-29 ENCOUNTER — Ambulatory Visit (INDEPENDENT_AMBULATORY_CARE_PROVIDER_SITE_OTHER): Payer: Managed Care, Other (non HMO) | Admitting: Neurology

## 2013-07-29 ENCOUNTER — Encounter: Payer: Self-pay | Admitting: Neurology

## 2013-07-29 VITALS — BP 130/77 | HR 89 | Temp 98.3°F | Ht 68.5 in | Wt 184.0 lb

## 2013-07-29 DIAGNOSIS — I749 Embolism and thrombosis of unspecified artery: Secondary | ICD-10-CM

## 2013-07-29 NOTE — Progress Notes (Signed)
[facility] [address] [place]. [place] [address] [phone]       OFFICE FOLLOW-UP NOTE  Mr. Justin [name] Date of Birth:  04-Nov-1950 Medical Record Number:  [id number]   HPI: 73 year Caucasian male seen for first office f/u visit after  [facility] admission for stroke on 04/21/13. He presented with inability to speak and right arm numbness for 20 minutes which resolved completely after reaching the emergency room.CT head was negative but MRI showed small scattered left hemispheric small infarcts while MRA showed no large vessel stenosis. 2DEcho showed normal ejection fraction and carotid dopplers showed no large vessel stenosis.LDL was minimally elevated at 103 and was started on zocor and aspirin. TEE showed no PFo or clot. He has had loop recorder inserted which has so far not recorded any atrial fibrillation.He states he has done well since discharge without any new neurovascular symptoms.he reports minor bruising on aspirin but has not had any bleeding.He is tolerating simvastatin without any myalgias.He has h/o Bell`s palsy in past.  ROS:   14 system review of systems is positive for minor bruising only  PMH:  Past Medical History  Diagnosis Date  . Bell's palsy 1980's; [id number]; 2000's; 12/2012; 03/2013    "multiple times" (04/21/2013)  . TIA (transient ischemic attack) 04/21/2013  . Stroke   . Hypercholesteremia     Social History:  History   Social History  . Marital Status: Married    Spouse Name: N/A    Number of Children: 3  . Years of Education: college   Occupational History  . [employer]    Social History Main Topics  . Smoking status: Never Smoker   . Smokeless tobacco: Never Used  . Alcohol Use: Yes     Comment: 03/22/2013 "glass of wine couple times/yr"  . Drug Use: No  . Sexual Activity: Yes   Other Topics Concern  . Not on file   Social History Narrative  . No narrative on file    Medications:   Current Outpatient Prescriptions on File  Prior to Visit  Medication Sig Dispense Refill  . aspirin 325 MG tablet Take 1 tablet (325 mg total) by mouth daily.  30 tablet  1  . simvastatin (ZOCOR) 10 MG tablet Take 1 tablet (10 mg total) by mouth at bedtime.  30 tablet  6   No current facility-administered medications on file prior to visit.    Allergies:  No Known Allergies  Physical Exam General: well developed, well nourished, seated, in no evident distress Head: head normocephalic and atraumatic. Orohparynx benign Neck: supple with no carotid or supraclavicular bruits Cardiovascular: regular rate and rhythm, no murmurs Musculoskeletal: no deformity Skin:  no rash/petichiae Vascular:  Normal pulses all extremities Filed Vitals:   07/29/13 1407  BP: 130/77  Pulse: 89  Temp: 98.3 F (36.8 C)    Neurologic Exam Mental Status: Awake and fully alert. Oriented to place and time. Recent and remote memory intact. Attention span, concentration and fund of knowledge appropriate. Mood and affect appropriate.  Cranial Nerves: Fundoscopic exam reveals sharp disc margins. Pupils equal, briskly reactive to light. Extraocular movements full without nystagmus. Visual fields full to confrontation. Hearing intact. Facial sensation intact. Face, tongue, palate moves normally and symmetrically.  Motor: Normal bulk and tone. Normal strength in all tested extremity muscles. Sensory.: intact to touch and pinprick and vibratory sensation.  Coordination: Rapid alternating movements normal in all extremities. Finger-to-nose and heel-to-shin performed accurately bilaterally. Gait and Station: Arises from  chair without difficulty. Stance is normal. Gait demonstrates normal stride length and balance . Able to heel, toe and tandem walk without difficulty.  Reflexes: 1+ and symmetric. Toes downgoing.   NIHSS  0 Modified Rankin  0   ASSESSMENT:  62 year male with embolic left MCA branch infarcts in July 2014 without identified source. Vascular  risk factor of hyperlipidimia only.   PLAN: Continue aspirin for stroke prevention and strict control of lipids with LDL cholesterol goal below 100 mg percent. Consider possible participation in the RESPECT ESUS trial for stroke prevention if interested. Return for follow up in 3 months with Larita Fife, NP or call earlier if necessary.

## 2013-07-29 NOTE — Patient Instructions (Signed)
Continue aspirin for stroke prevention and strict control of lipids with LDL cholesterol goal below 100 mg percent. Consider possible participation in the RESPECT ESUS trial for stroke prevention if interested. Return for follow up in 3 months with Larita Fife, NP or call earlier if necessary.

## 2013-08-04 ENCOUNTER — Ambulatory Visit (INDEPENDENT_AMBULATORY_CARE_PROVIDER_SITE_OTHER): Payer: Managed Care, Other (non HMO) | Admitting: Internal Medicine

## 2013-08-04 ENCOUNTER — Encounter: Payer: Self-pay | Admitting: Internal Medicine

## 2013-08-04 VITALS — BP 128/83 | HR 85 | Ht 69.0 in | Wt 181.2 lb

## 2013-08-04 DIAGNOSIS — R55 Syncope and collapse: Secondary | ICD-10-CM

## 2013-08-04 DIAGNOSIS — Z959 Presence of cardiac and vascular implant and graft, unspecified: Secondary | ICD-10-CM

## 2013-08-04 DIAGNOSIS — I639 Cerebral infarction, unspecified: Secondary | ICD-10-CM

## 2013-08-04 DIAGNOSIS — I6350 Cerebral infarction due to unspecified occlusion or stenosis of unspecified cerebral artery: Secondary | ICD-10-CM

## 2013-08-04 LAB — PACEMAKER DEVICE OBSERVATION

## 2013-08-04 NOTE — Patient Instructions (Addendum)
Remote monitoring is used to monitor your Pacemaker of ICD from home. This monitoring reduces the number of office visits required to check your device to one time per year. It allows [place] to keep an eye on the functioning of your device to ensure it is working properly. You are scheduled for a device check from home on 11/03/2013. You may send your transmission at any time that day. If you have a wireless device, the transmission will be sent automatically. After your physician reviews your transmission, you will receive a postcard with your next transmission date.  Your physician wants you to follow-up in: 9 months with Dr. Graciela Husbands.  You will receive a reminder letter in the mail two months in advance. If you don't receive a letter, please call our office to schedule the follow-up appointment.

## 2013-08-04 NOTE — Assessment & Plan Note (Signed)
The patient's device was interrogated.  The information was reviewed. No changes were made in the programming.    

## 2013-08-04 NOTE — Assessment & Plan Note (Signed)
No intercurrent events

## 2013-08-04 NOTE — Progress Notes (Signed)
      Patient Care Team: Salley Scarlet, MD as PCP - General (Family Medicine)   HPI  Justin [name] is a 62 y.o. male Seen in followup for cryptogenic stroke. LINQ  he or his he wore a at your a in a in a in the vitamin and was implanted 7/14 Past Medical History  Diagnosis Date  . Bell's palsy 1980's; [id number]; 2000's; 12/2012; 03/2013    "multiple times" (04/21/2013)  . TIA (transient ischemic attack) 04/21/2013  . Stroke   . Hypercholesteremia     Past Surgical History  Procedure Laterality Date  . Tee without cardioversion N/A 04/24/2013    Procedure: TRANSESOPHAGEAL ECHOCARDIOGRAM (TEE);  Surgeon: Vesta Mixer, MD;  Location: [facility] ENDOSCOPY;  Service: Cardiovascular;  Laterality: N/A;  . Implanted loop recorder    . Colonoscopy N/A 07/13/2013    Procedure: COLONOSCOPY;  Surgeon: West Bali, MD;  Location: [facility] ENDO SUITE;  Service: Endoscopy;  Laterality: N/A;  8:30 AM    Current Outpatient Prescriptions  Medication Sig Dispense Refill  . aspirin 325 MG tablet Take 1 tablet (325 mg total) by mouth daily.  30 tablet  1  . simvastatin (ZOCOR) 10 MG tablet Take 1 tablet (10 mg total) by mouth at bedtime.  30 tablet  6   No current facility-administered medications for this visit.    No Known Allergies  Review of Systems negative except from HPI and PMH  Physical Exam BP 128/83  Pulse 85  Ht 5\' 9"  (1.753 m)  Wt 181 lb 3.2 oz (82.192 kg)  BMI 26.75 kg/m2 Well developed and nourished in no acute distress HENT normal Neck supple with JVP-flat Clear Regular rate and rhythm, no murmurs or gallops Abd-soft with active BS No Clubbing cyanosis edema Skin-warm and dry A & Oriented  Grossly normal sensory and motor function y    Assessment and  Plan

## 2013-08-11 ENCOUNTER — Encounter: Payer: Self-pay | Admitting: Internal Medicine

## 2013-08-20 ENCOUNTER — Other Ambulatory Visit: Payer: Self-pay

## 2013-08-25 ENCOUNTER — Ambulatory Visit (INDEPENDENT_AMBULATORY_CARE_PROVIDER_SITE_OTHER): Payer: Managed Care, Other (non HMO) | Admitting: *Deleted

## 2013-08-25 DIAGNOSIS — I6350 Cerebral infarction due to unspecified occlusion or stenosis of unspecified cerebral artery: Secondary | ICD-10-CM

## 2013-08-25 DIAGNOSIS — I639 Cerebral infarction, unspecified: Secondary | ICD-10-CM

## 2013-09-11 ENCOUNTER — Encounter: Payer: Self-pay | Admitting: Internal Medicine

## 2013-09-15 ENCOUNTER — Encounter: Payer: Self-pay | Admitting: Family Medicine

## 2013-09-15 ENCOUNTER — Ambulatory Visit (INDEPENDENT_AMBULATORY_CARE_PROVIDER_SITE_OTHER): Payer: Managed Care, Other (non HMO) | Admitting: Family Medicine

## 2013-09-15 VITALS — BP 100/80 | HR 80 | Temp 98.3°F | Resp 18 | Ht 67.0 in | Wt 179.0 lb

## 2013-09-15 DIAGNOSIS — E785 Hyperlipidemia, unspecified: Secondary | ICD-10-CM

## 2013-09-15 DIAGNOSIS — Z8673 Personal history of transient ischemic attack (TIA), and cerebral infarction without residual deficits: Secondary | ICD-10-CM

## 2013-09-15 LAB — CBC
HCT: 45.9 % (ref 39.0–52.0)
Hemoglobin: 15.4 g/dL (ref 13.0–17.0)
MCH: 28.6 pg (ref 26.0–34.0)
MCV: 85.3 fL (ref 78.0–100.0)
Platelets: 248 10*3/uL (ref 150–400)
RBC: 5.38 MIL/uL (ref 4.22–5.81)
WBC: 5.8 10*3/uL (ref 4.0–10.5)

## 2013-09-15 LAB — COMPREHENSIVE METABOLIC PANEL
Albumin: 4.6 g/dL (ref 3.5–5.2)
BUN: 17 mg/dL (ref 6–23)
CO2: 27 mEq/L (ref 19–32)
Glucose, Bld: 95 mg/dL (ref 70–99)
Sodium: 142 mEq/L (ref 135–145)
Total Bilirubin: 0.6 mg/dL (ref 0.3–1.2)
Total Protein: 7.3 g/dL (ref 6.0–8.3)

## 2013-09-15 LAB — LIPID PANEL: HDL: 46 mg/dL (ref >39)

## 2013-09-15 NOTE — Assessment & Plan Note (Signed)
Will recheck fasting lipid panel today. Continue statin drug in full dose aspirin. Liver function tests to be checked

## 2013-09-15 NOTE — Assessment & Plan Note (Signed)
History of CVA. I reviewed his neurology and cardiology notes. Still unknown cause of the event. We will keep his risk factors down. His blood pressure looks great. He's on full dose aspirin.

## 2013-09-15 NOTE — Progress Notes (Signed)
   Subjective:    Patient ID: Justin [name], male    DOB: 16-Jul-1951, 62 y.o.   MRN: [id number]  HPI Pt here to f/u chronic medical problems. No specific concerns TOlerating medications Reviewed last cardiology and neurology note Declines flu shot today, will get at pharmacy   Review of Systems  GEN- denies fatigue, fever, weight loss,weakness, recent illness HEENT- denies eye drainage, change in vision, nasal discharge, CVS- denies chest pain, palpitations RESP- denies SOB, cough, wheeze ABD- denies N/V, change in stools, abd pain GU- denies dysuria, hematuria, dribbling, incontinence MSK- denies joint pain, muscle aches, injury Neuro- denies headache, dizziness, syncope, seizure activity      Objective:   Physical Exam GEN- NAD, alert and oriented x3 HEENT- PERRL, EOMI, non injected sclera, pink conjunctiva, MMM, oropharynx clear CVS- RRR, no murmur RESP-CTAB EXT- No edema Pulses- Radial 2+        Assessment & Plan:

## 2013-09-15 NOTE — Patient Instructions (Signed)
If labs are normal letter will be sent I recommend flu shot and tetanus shot  F/U 6 months for Physical

## 2013-09-17 ENCOUNTER — Other Ambulatory Visit: Payer: Self-pay | Admitting: Family Medicine

## 2013-09-17 ENCOUNTER — Encounter: Payer: Self-pay | Admitting: *Deleted

## 2013-09-17 DIAGNOSIS — E875 Hyperkalemia: Secondary | ICD-10-CM

## 2013-10-06 ENCOUNTER — Ambulatory Visit (INDEPENDENT_AMBULATORY_CARE_PROVIDER_SITE_OTHER): Payer: Managed Care, Other (non HMO) | Admitting: *Deleted

## 2013-10-06 DIAGNOSIS — Z8673 Personal history of transient ischemic attack (TIA), and cerebral infarction without residual deficits: Secondary | ICD-10-CM

## 2013-10-26 ENCOUNTER — Ambulatory Visit (INDEPENDENT_AMBULATORY_CARE_PROVIDER_SITE_OTHER): Payer: 59 | Admitting: *Deleted

## 2013-10-26 DIAGNOSIS — Z8673 Personal history of transient ischemic attack (TIA), and cerebral infarction without residual deficits: Secondary | ICD-10-CM

## 2013-11-04 ENCOUNTER — Ambulatory Visit: Payer: Managed Care, Other (non HMO) | Admitting: Nurse Practitioner

## 2013-11-26 ENCOUNTER — Encounter: Payer: Self-pay | Admitting: Internal Medicine

## 2013-12-08 ENCOUNTER — Ambulatory Visit (INDEPENDENT_AMBULATORY_CARE_PROVIDER_SITE_OTHER): Payer: 59 | Admitting: *Deleted

## 2013-12-08 DIAGNOSIS — Z8673 Personal history of transient ischemic attack (TIA), and cerebral infarction without residual deficits: Secondary | ICD-10-CM

## 2013-12-08 LAB — MDC_IDC_ENUM_SESS_TYPE_REMOTE

## 2013-12-10 LAB — MDC_IDC_ENUM_SESS_TYPE_REMOTE

## 2014-01-08 ENCOUNTER — Ambulatory Visit (INDEPENDENT_AMBULATORY_CARE_PROVIDER_SITE_OTHER): Payer: 59 | Admitting: *Deleted

## 2014-01-08 DIAGNOSIS — I6789 Other cerebrovascular disease: Secondary | ICD-10-CM

## 2014-01-08 LAB — MDC_IDC_ENUM_SESS_TYPE_REMOTE

## 2014-01-18 ENCOUNTER — Encounter: Payer: Self-pay | Admitting: Internal Medicine

## 2014-01-19 ENCOUNTER — Encounter: Payer: Self-pay | Admitting: Internal Medicine

## 2014-02-09 ENCOUNTER — Ambulatory Visit (INDEPENDENT_AMBULATORY_CARE_PROVIDER_SITE_OTHER): Payer: 59 | Admitting: *Deleted

## 2014-02-09 DIAGNOSIS — I6350 Cerebral infarction due to unspecified occlusion or stenosis of unspecified cerebral artery: Secondary | ICD-10-CM

## 2014-02-09 LAB — MDC_IDC_ENUM_SESS_TYPE_REMOTE

## 2014-03-02 ENCOUNTER — Encounter: Payer: Self-pay | Admitting: Internal Medicine

## 2014-03-09 ENCOUNTER — Ambulatory Visit (INDEPENDENT_AMBULATORY_CARE_PROVIDER_SITE_OTHER): Payer: 59 | Admitting: *Deleted

## 2014-03-09 ENCOUNTER — Encounter: Payer: 59 | Admitting: *Deleted

## 2014-03-09 DIAGNOSIS — Z8673 Personal history of transient ischemic attack (TIA), and cerebral infarction without residual deficits: Secondary | ICD-10-CM

## 2014-03-09 LAB — MDC_IDC_ENUM_SESS_TYPE_REMOTE

## 2014-03-11 ENCOUNTER — Encounter: Payer: Self-pay | Admitting: Internal Medicine

## 2014-04-02 ENCOUNTER — Ambulatory Visit (INDEPENDENT_AMBULATORY_CARE_PROVIDER_SITE_OTHER): Payer: 59 | Admitting: *Deleted

## 2014-04-02 DIAGNOSIS — I6350 Cerebral infarction due to unspecified occlusion or stenosis of unspecified cerebral artery: Secondary | ICD-10-CM

## 2014-04-20 NOTE — Progress Notes (Signed)
Loop recorder 

## 2014-04-28 LAB — MDC_IDC_ENUM_SESS_TYPE_REMOTE

## 2014-05-07 ENCOUNTER — Ambulatory Visit (INDEPENDENT_AMBULATORY_CARE_PROVIDER_SITE_OTHER): Payer: 59 | Admitting: *Deleted

## 2014-05-07 DIAGNOSIS — I6350 Cerebral infarction due to unspecified occlusion or stenosis of unspecified cerebral artery: Secondary | ICD-10-CM

## 2014-05-07 LAB — MDC_IDC_ENUM_SESS_TYPE_REMOTE

## 2014-05-08 DIAGNOSIS — I6350 Cerebral infarction due to unspecified occlusion or stenosis of unspecified cerebral artery: Secondary | ICD-10-CM

## 2014-05-19 NOTE — Progress Notes (Signed)
Loop recorder 

## 2014-05-21 ENCOUNTER — Encounter: Payer: Self-pay | Admitting: Internal Medicine

## 2014-06-01 ENCOUNTER — Telehealth: Payer: Self-pay | Admitting: Cardiology

## 2014-06-01 ENCOUNTER — Encounter: Payer: Self-pay | Admitting: Cardiology

## 2014-06-01 NOTE — Telephone Encounter (Signed)
LMOVM for pt to send manual transmission for loop recorder b/c home monitor is deactivated.

## 2014-06-08 ENCOUNTER — Encounter: Payer: Self-pay | Admitting: Internal Medicine

## 2014-06-08 ENCOUNTER — Ambulatory Visit (INDEPENDENT_AMBULATORY_CARE_PROVIDER_SITE_OTHER): Payer: BC Managed Care – PPO | Admitting: *Deleted

## 2014-06-08 DIAGNOSIS — I6350 Cerebral infarction due to unspecified occlusion or stenosis of unspecified cerebral artery: Secondary | ICD-10-CM

## 2014-06-10 ENCOUNTER — Encounter: Payer: Self-pay | Admitting: Internal Medicine

## 2014-06-17 NOTE — Progress Notes (Signed)
Loop recorder 

## 2014-06-29 LAB — MDC_IDC_ENUM_SESS_TYPE_REMOTE
Date Time Interrogation Session: 20150819005452
MDC IDC SET ZONE DETECTION INTERVAL: 3000 ms
Zone Setting Detection Interval: 2000 ms
Zone Setting Detection Interval: 400 ms

## 2014-07-08 ENCOUNTER — Encounter: Payer: Self-pay | Admitting: Internal Medicine

## 2014-07-08 ENCOUNTER — Ambulatory Visit (INDEPENDENT_AMBULATORY_CARE_PROVIDER_SITE_OTHER): Payer: BC Managed Care – PPO | Admitting: *Deleted

## 2014-07-08 DIAGNOSIS — I639 Cerebral infarction, unspecified: Secondary | ICD-10-CM

## 2014-07-08 DIAGNOSIS — I6350 Cerebral infarction due to unspecified occlusion or stenosis of unspecified cerebral artery: Secondary | ICD-10-CM

## 2014-07-08 LAB — MDC_IDC_ENUM_SESS_TYPE_REMOTE
Date Time Interrogation Session: 20150919143058
MDC IDC SET ZONE DETECTION INTERVAL: 400 ms
Zone Setting Detection Interval: 2000 ms
Zone Setting Detection Interval: 3000 ms

## 2014-07-09 ENCOUNTER — Encounter: Payer: Self-pay | Admitting: Internal Medicine

## 2014-07-19 ENCOUNTER — Encounter: Payer: Self-pay | Admitting: Internal Medicine

## 2014-07-20 ENCOUNTER — Encounter: Payer: Self-pay | Admitting: Internal Medicine

## 2014-07-20 ENCOUNTER — Ambulatory Visit (INDEPENDENT_AMBULATORY_CARE_PROVIDER_SITE_OTHER): Payer: BC Managed Care – PPO | Admitting: Internal Medicine

## 2014-07-20 VITALS — BP 128/80 | HR 71 | Ht 69.0 in | Wt 178.8 lb

## 2014-07-20 DIAGNOSIS — E785 Hyperlipidemia, unspecified: Secondary | ICD-10-CM

## 2014-07-20 DIAGNOSIS — Z4509 Encounter for adjustment and management of other cardiac device: Secondary | ICD-10-CM

## 2014-07-20 DIAGNOSIS — I639 Cerebral infarction, unspecified: Secondary | ICD-10-CM

## 2014-07-20 DIAGNOSIS — I480 Paroxysmal atrial fibrillation: Secondary | ICD-10-CM

## 2014-07-20 LAB — MDC_IDC_ENUM_SESS_TYPE_INCLINIC
MDC IDC SET ZONE DETECTION INTERVAL: 2000 ms
Zone Setting Detection Interval: 3000 ms
Zone Setting Detection Interval: 400 ms

## 2014-07-20 NOTE — Progress Notes (Signed)
      Patient Care Team: [name], MD as PCP - General (Family Medicine)   HPI  Justin [name] is a 63 y.o. male Seen in followup for cryptogenic stroke. LINQ    The patient denies chest pain, shortness of breath, nocturnal dyspnea, orthopnea or peripheral edema.  There have been no palpitations, lightheadedness or syncope.        Past Medical History  Diagnosis Date  . Bell's palsy 1980's; [id number]; 2000's; 12/2012; 03/2013    "multiple times" (04/21/2013)  . TIA (transient ischemic attack) 04/21/2013  . Stroke   . Hypercholesteremia     Past Surgical History  Procedure Laterality Date  . Tee without cardioversion N/A 04/24/2013    Procedure: TRANSESOPHAGEAL ECHOCARDIOGRAM (TEE);  Surgeon: Thayer Headings, MD;  Location: [facility];  Service: Cardiovascular;  Laterality: N/A;  . Implanted loop recorder    . Colonoscopy N/A 07/13/2013    Procedure: COLONOSCOPY;  Surgeon: Danie Binder, MD;  Location: [facility] ENDO SUITE;  Service: Endoscopy;  Laterality: N/A;  8:30 AM    Current Outpatient Prescriptions  Medication Sig Dispense Refill  . aspirin 325 MG tablet Take 1 tablet (325 mg total) by mouth daily.  30 tablet  1  . simvastatin (ZOCOR) 10 MG tablet Take 1 tablet (10 mg total) by mouth at bedtime.  30 tablet  6   No current facility-administered medications for this visit.    No Known Allergies  Review of Systems negative except from HPI and PMH  Physical Exam BP 128/80  Pulse 71  Ht 5\' 9"  (1.753 m)  Wt 178 lb 12.8 oz (81.103 kg)  BMI 26.39 kg/m2 Well developed and nourished in no acute distress HENT normal Neck supple with JVP-flat Clear Regular rate and rhythm, no murmurs or gallops Abd-soft with active BS No Clubbing cyanosis edema Skin-warm and dry A & Oriented  Grossly normal sensory and motor function y    Assessment and  Plan  Cryptogenic stroke  Hyperlipidemia  Implantable loop recorder-LINQ   There is discrepancy in guideline  recommendations related to statin therapy. Some recommend SPARCL trial direction i.e. high-dose statins; guidelines post in 2011 use a LDL threshold I spoke with Dr. Wilber Bihari; he prefers the latter. The target LDL should be less than 70; his lipids are scheduled to be drawn again in December.  His loop recorder was interrogated it is somewhat bothersome in that he is aware that occasionally

## 2014-07-20 NOTE — Patient Instructions (Signed)
Your physician wants you to follow-up in: 6 months with Dr. Klein. You will receive a reminder letter in the mail two months in advance. If you don't receive a letter, please call our office to schedule the follow-up appointment.  Your physician recommends that you continue on your current medications as directed. Please refer to the Current Medication list given to you today.  

## 2014-07-23 NOTE — Progress Notes (Signed)
Loop recorder 

## 2014-08-03 ENCOUNTER — Encounter: Payer: Self-pay | Admitting: Internal Medicine

## 2014-08-06 ENCOUNTER — Ambulatory Visit (INDEPENDENT_AMBULATORY_CARE_PROVIDER_SITE_OTHER): Payer: BC Managed Care – PPO | Admitting: *Deleted

## 2014-08-06 DIAGNOSIS — I639 Cerebral infarction, unspecified: Secondary | ICD-10-CM

## 2014-08-12 LAB — MDC_IDC_ENUM_SESS_TYPE_REMOTE

## 2014-08-16 NOTE — Progress Notes (Signed)
Loop recorder 

## 2014-09-02 ENCOUNTER — Ambulatory Visit (INDEPENDENT_AMBULATORY_CARE_PROVIDER_SITE_OTHER): Payer: BC Managed Care – PPO | Admitting: *Deleted

## 2014-09-02 DIAGNOSIS — I639 Cerebral infarction, unspecified: Secondary | ICD-10-CM

## 2014-09-02 LAB — MDC_IDC_ENUM_SESS_TYPE_REMOTE

## 2014-09-14 ENCOUNTER — Encounter: Payer: Self-pay | Admitting: Internal Medicine

## 2014-09-17 NOTE — Progress Notes (Signed)
Loop recorder 

## 2014-09-23 ENCOUNTER — Encounter (HOSPITAL_COMMUNITY): Payer: Self-pay | Admitting: Internal Medicine

## 2014-09-24 ENCOUNTER — Other Ambulatory Visit: Payer: Self-pay | Admitting: Internal Medicine

## 2014-10-05 ENCOUNTER — Ambulatory Visit (INDEPENDENT_AMBULATORY_CARE_PROVIDER_SITE_OTHER): Payer: BC Managed Care – PPO | Admitting: *Deleted

## 2014-10-05 ENCOUNTER — Encounter: Payer: Self-pay | Admitting: Internal Medicine

## 2014-10-05 DIAGNOSIS — I639 Cerebral infarction, unspecified: Secondary | ICD-10-CM

## 2014-10-05 LAB — MDC_IDC_ENUM_SESS_TYPE_REMOTE
Date Time Interrogation Session: 20151221235856
MDC IDC SET ZONE DETECTION INTERVAL: 400 ms
Zone Setting Detection Interval: 2000 ms
Zone Setting Detection Interval: 3000 ms

## 2014-10-13 NOTE — Progress Notes (Signed)
Loop recorder 

## 2014-11-09 ENCOUNTER — Ambulatory Visit (INDEPENDENT_AMBULATORY_CARE_PROVIDER_SITE_OTHER): Payer: BLUE CROSS/BLUE SHIELD | Admitting: *Deleted

## 2014-11-09 DIAGNOSIS — I639 Cerebral infarction, unspecified: Secondary | ICD-10-CM

## 2014-11-11 ENCOUNTER — Encounter: Payer: Self-pay | Admitting: Internal Medicine

## 2014-11-11 NOTE — Progress Notes (Signed)
Loop recorder 

## 2014-11-22 ENCOUNTER — Other Ambulatory Visit: Payer: BLUE CROSS/BLUE SHIELD

## 2014-11-22 DIAGNOSIS — E785 Hyperlipidemia, unspecified: Secondary | ICD-10-CM

## 2014-11-22 DIAGNOSIS — Z0000 Encounter for general adult medical examination without abnormal findings: Secondary | ICD-10-CM

## 2014-11-22 DIAGNOSIS — Z79899 Other long term (current) drug therapy: Secondary | ICD-10-CM

## 2014-11-22 DIAGNOSIS — Z125 Encounter for screening for malignant neoplasm of prostate: Secondary | ICD-10-CM

## 2014-11-22 LAB — COMPLETE METABOLIC PANEL WITH GFR
ALBUMIN: 4.3 g/dL (ref 3.5–5.2)
ALT: 21 U/L (ref 0–53)
AST: 15 U/L (ref 0–37)
Alkaline Phosphatase: 76 U/L (ref 39–117)
BUN: 17 mg/dL (ref 6–23)
CALCIUM: 9.3 mg/dL (ref 8.4–10.5)
CHLORIDE: 105 meq/L (ref 96–112)
CO2: 25 mEq/L (ref 19–32)
CREATININE: 0.96 mg/dL (ref 0.50–1.35)
GFR, Est African American: >89 mL/min
GFR, Est Non African American: 85 mL/min
GLUCOSE: 100 mg/dL — AB (ref 70–99)
Potassium: 4.3 mEq/L (ref 3.5–5.3)
SODIUM: 140 meq/L (ref 135–145)
TOTAL PROTEIN: 6.7 g/dL (ref 6.0–8.3)
Total Bilirubin: 0.5 mg/dL (ref 0.2–1.2)

## 2014-11-22 LAB — CBC WITH DIFFERENTIAL/PLATELET
BASOS ABS: 0 10*3/uL (ref 0.0–0.1)
BASOS PCT: 0 % (ref 0–1)
EOS PCT: 2 % (ref 0–5)
Eosinophils Absolute: 0.1 10*3/uL (ref 0.0–0.7)
HEMATOCRIT: 47.8 % (ref 39.0–52.0)
HEMOGLOBIN: 15.7 g/dL (ref 13.0–17.0)
LYMPHS ABS: 2 10*3/uL (ref 0.7–4.0)
Lymphocytes Relative: 35 % (ref 12–46)
MCH: 28.6 pg (ref 26.0–34.0)
MCHC: 32.8 g/dL (ref 30.0–36.0)
MCV: 87.2 fL (ref 78.0–100.0)
MONO ABS: 0.3 10*3/uL (ref 0.1–1.0)
MPV: 9.2 fL (ref 8.6–12.4)
Monocytes Relative: 6 % (ref 3–12)
NEUTROS ABS: 3.3 10*3/uL (ref 1.7–7.7)
Neutrophils Relative %: 57 % (ref 43–77)
Platelets: 224 10*3/uL (ref 150–400)
RBC: 5.48 MIL/uL (ref 4.22–5.81)
RDW: 13.5 % (ref 11.5–15.5)
WBC: 5.8 10*3/uL (ref 4.0–10.5)

## 2014-11-22 LAB — LIPID PANEL
Cholesterol: 144 mg/dL (ref 0–200)
HDL: 45 mg/dL (ref >39)
LDL Cholesterol: 88 mg/dL (ref 0–99)
Total CHOL/HDL Ratio: 3.2 Ratio
Triglycerides: 56 mg/dL (ref <150)
VLDL: 11 mg/dL (ref 0–40)

## 2014-11-22 LAB — TSH: TSH: 1.777 u[IU]/mL (ref 0.350–4.500)

## 2014-11-23 LAB — PSA: PSA: 0.54 ng/mL (ref <=4.00)

## 2014-11-24 ENCOUNTER — Ambulatory Visit (INDEPENDENT_AMBULATORY_CARE_PROVIDER_SITE_OTHER): Payer: BLUE CROSS/BLUE SHIELD | Admitting: Family Medicine

## 2014-11-24 ENCOUNTER — Encounter: Payer: Self-pay | Admitting: Family Medicine

## 2014-11-24 VITALS — BP 122/68 | HR 78 | Temp 98.7°F | Resp 14 | Ht 69.0 in | Wt 184.0 lb

## 2014-11-24 DIAGNOSIS — Z8673 Personal history of transient ischemic attack (TIA), and cerebral infarction without residual deficits: Secondary | ICD-10-CM

## 2014-11-24 DIAGNOSIS — Z0000 Encounter for general adult medical examination without abnormal findings: Secondary | ICD-10-CM

## 2014-11-24 DIAGNOSIS — Z23 Encounter for immunization: Secondary | ICD-10-CM

## 2014-11-24 DIAGNOSIS — E785 Hyperlipidemia, unspecified: Secondary | ICD-10-CM

## 2014-11-24 MED ORDER — SIMVASTATIN 10 MG PO TABS: 10.0000 mg | ORAL_TABLET | Freq: Every day | ORAL | Status: DC

## 2014-11-24 MED ORDER — ZOSTER VACCINE LIVE 19400 UNT/0.65ML ~~LOC~~ SOLR: 0.6500 mL | Freq: Once | SUBCUTANEOUS | Status: DC

## 2014-11-24 NOTE — Patient Instructions (Signed)
Restart cholesterol medication TDAP given  F/U 1 year

## 2014-11-24 NOTE — Assessment & Plan Note (Signed)
Goal is LDL closer to 70, restart simvastatin 10mg  at bedtime

## 2014-11-24 NOTE — Progress Notes (Signed)
Patient ID: Justin [name], male   DOB: Mar 05, 1951, 64 y.o.   MRN: [id number]   Subjective:    Patient ID: Justin [name], male    DOB: [birth year], 64 y.o.   MRN: [id number]  Patient presents for CPE  No concerns, had virus last week, no improved. He did forget to refill his zocor, he is on because of stroke in 2014. He has Loop recorder still present he will see cardiology in may and discuss taking this out.   Taking ASA as prescribed Due for Tetanus and Shingles vaccine, declines flu Colonoscopy UTD Fasting labs reviewed     Review Of Systems:  GEN- denies fatigue, fever, weight loss,weakness, recent illness HEENT- denies eye drainage, change in vision, nasal discharge, CVS- denies chest pain, palpitations RESP- denies SOB, cough, wheeze ABD- denies N/V, change in stools, abd pain GU- denies dysuria, hematuria, dribbling, incontinence MSK- denies joint pain, muscle aches, injury Neuro- denies headache, dizziness, syncope, seizure activity       Objective:    BP 122/68 mmHg  Pulse 78  Temp(Src) 98.7 F (37.1 C) (Oral)  Resp 14  Ht 5\' 9"  (1.753 m)  Wt 184 lb (83.462 kg)  BMI 27.16 kg/m2 GEN- NAD, alert and oriented x3 HEENT- PERRL, EOMI, non injected sclera, pink conjunctiva, MMM, oropharynx clear Neck- Supple, no thyromegaly, no carotid bruit CVS- RRR, no murmur RESP-CTAB ABD-NABS,soft,NT,ND Rectal- normal tone, small external tags, FOBT neg, prostate smooth, no nodule, no enlargement EXT- No edema Pulses- Radial, DP- 2+        Assessment & Plan:      Problem List Items Addressed This Visit    None    Visit Diagnoses    Routine general medical examination at a health care facility    -  Primary    CPE done, fasting labs, TDAP, given, shingles vaccine sent to pharmacy, RTC 1 year       Note: This dictation was prepared with Dragon dictation along with smaller phrase technology. Any transcriptional errors that result from this process are unintentional.

## 2014-11-29 LAB — MDC_IDC_ENUM_SESS_TYPE_REMOTE

## 2014-12-09 ENCOUNTER — Ambulatory Visit (INDEPENDENT_AMBULATORY_CARE_PROVIDER_SITE_OTHER): Payer: BLUE CROSS/BLUE SHIELD | Admitting: *Deleted

## 2014-12-09 DIAGNOSIS — I639 Cerebral infarction, unspecified: Secondary | ICD-10-CM

## 2014-12-10 NOTE — Progress Notes (Signed)
Loop recorder 

## 2014-12-20 ENCOUNTER — Encounter: Payer: Self-pay | Admitting: Family Medicine

## 2014-12-21 MED ORDER — PREDNISONE 10 MG PO TABS: ORAL_TABLET | ORAL | Status: DC

## 2014-12-21 MED ORDER — ACYCLOVIR 800 MG PO TABS: 800.0000 mg | ORAL_TABLET | Freq: Four times a day (QID) | ORAL | Status: DC

## 2014-12-23 LAB — MDC_IDC_ENUM_SESS_TYPE_REMOTE: Date Time Interrogation Session: 20160226050500

## 2014-12-29 ENCOUNTER — Encounter: Payer: Self-pay | Admitting: Internal Medicine

## 2014-12-31 ENCOUNTER — Encounter: Payer: Self-pay | Admitting: Internal Medicine

## 2015-01-07 ENCOUNTER — Ambulatory Visit (INDEPENDENT_AMBULATORY_CARE_PROVIDER_SITE_OTHER): Payer: BLUE CROSS/BLUE SHIELD | Admitting: *Deleted

## 2015-01-07 DIAGNOSIS — I639 Cerebral infarction, unspecified: Secondary | ICD-10-CM | POA: Diagnosis not present

## 2015-01-11 ENCOUNTER — Encounter: Payer: Self-pay | Admitting: Internal Medicine

## 2015-01-11 NOTE — Progress Notes (Signed)
Loop recorder 

## 2015-01-25 ENCOUNTER — Encounter: Payer: Self-pay | Admitting: Internal Medicine

## 2015-02-01 LAB — MDC_IDC_ENUM_SESS_TYPE_REMOTE: Date Time Interrogation Session: 20160412040500

## 2015-02-07 ENCOUNTER — Ambulatory Visit (INDEPENDENT_AMBULATORY_CARE_PROVIDER_SITE_OTHER): Payer: BLUE CROSS/BLUE SHIELD | Admitting: *Deleted

## 2015-02-07 DIAGNOSIS — I639 Cerebral infarction, unspecified: Secondary | ICD-10-CM

## 2015-02-09 NOTE — Progress Notes (Signed)
Loop recorder 

## 2015-02-10 ENCOUNTER — Encounter: Payer: Self-pay | Admitting: Internal Medicine

## 2015-02-14 ENCOUNTER — Encounter: Payer: Self-pay | Admitting: Internal Medicine

## 2015-02-15 ENCOUNTER — Encounter: Payer: Self-pay | Admitting: Internal Medicine

## 2015-02-21 ENCOUNTER — Encounter: Payer: Self-pay | Admitting: Internal Medicine

## 2015-02-21 ENCOUNTER — Ambulatory Visit (INDEPENDENT_AMBULATORY_CARE_PROVIDER_SITE_OTHER): Payer: BLUE CROSS/BLUE SHIELD | Admitting: Internal Medicine

## 2015-02-21 VITALS — BP 130/84 | HR 89 | Ht 69.0 in | Wt 188.8 lb

## 2015-02-21 DIAGNOSIS — I639 Cerebral infarction, unspecified: Secondary | ICD-10-CM

## 2015-02-21 DIAGNOSIS — Z4509 Encounter for adjustment and management of other cardiac device: Secondary | ICD-10-CM

## 2015-02-21 LAB — CUP PACEART INCLINIC DEVICE CHECK
Date Time Interrogation Session: 20160509171500
Zone Setting Detection Interval: 2000 ms
Zone Setting Detection Interval: 3000 ms
Zone Setting Detection Interval: 400 ms

## 2015-02-21 NOTE — Progress Notes (Signed)
      Patient Care Team: [name], MD as PCP - General (Family Medicine)   HPI  Justin [name] is a 64 y.o. male Seen in followup for cryptogenic stroke. LINQ    The patient denies chest pain, shortness of breath, nocturnal dyspnea, orthopnea or peripheral edema.  There have been no palpitations, lightheadedness or syncope.   The device is uncomfortable     Past Medical History  Diagnosis Date  . Bell's palsy 1980's; [id number]; 2000's; 12/2012; 03/2013    "multiple times" (04/21/2013)  . TIA (transient ischemic attack) 04/21/2013  . Stroke   . Hypercholesteremia     Past Surgical History  Procedure Laterality Date  . Tee without cardioversion N/A 04/24/2013    Procedure: TRANSESOPHAGEAL ECHOCARDIOGRAM (TEE);  Surgeon: Thayer Headings, MD;  Location: [facility];  Service: Cardiovascular;  Laterality: N/A;  . Implanted loop recorder    . Colonoscopy N/A 07/13/2013    Procedure: COLONOSCOPY;  Surgeon: Danie Binder, MD;  Location: [facility] ENDO SUITE;  Service: Endoscopy;  Laterality: N/A;  8:30 AM  . Loop recorder implant N/A 04/24/2013    Procedure: LOOP RECORDER IMPLANT;  Surgeon: Deboraha Sprang, MD;  Location: [facility] CATH LAB;  Service: Cardiovascular;  Laterality: N/A;    Current Outpatient Prescriptions  Medication Sig Dispense Refill  . acyclovir (ZOVIRAX) 800 MG tablet Take 1 tablet (800 mg total) by mouth 4 (four) times daily. (Patient taking differently: Take 800 mg by mouth as needed. ) 20 tablet 1  . aspirin 325 MG tablet Take 1 tablet (325 mg total) by mouth daily. 30 tablet 1  . predniSONE (DELTASONE) 10 MG tablet 6 tabs on day 1, 5 tabs on day 2, 4 tabs on day 3, 3 tabs on day 4, 2 tabs on day 5, and 1 tab on day 6. (Patient taking differently: Take 10 mg by mouth as needed. 6 tabs on day 1, 5 tabs on day 2, 4 tabs on day 3, 3 tabs on day 4, 2 tabs on day 5, and 1 tab on day 6.) 21 tablet 0  . simvastatin (ZOCOR) 10 MG tablet Take 1 tablet (10 mg total) by mouth at  bedtime. 90 tablet 3  . zoster vaccine live, PF, (ZOSTAVAX) 19417 UNT/0.65ML injection Inject 19,400 Units into the skin once. (Patient not taking: Reported on 02/21/2015) 1 each 0   No current facility-administered medications for this visit.    No Known Allergies  Review of Systems negative except from HPI and PMH  Physical Exam BP 130/84 mmHg  Pulse 89  Ht 5\' 9"  (1.753 m)  Wt 188 lb 12.8 oz (85.639 kg)  BMI 27.87 kg/m2 Well developed and nourished in no acute distress HENT normal Neck supple with JVP-flat Clear Regular rate and rhythm, no murmurs or gallops Abd-soft with active BS No Clubbing cyanosis edema Skin-warm and dry A & Oriented  Grossly normal sensory and motor function      Assessment and  Plan  Cryptogenic stroke  Hyperlipidemia  Implantable loop recorder-LINQ  No interval atrial fibrillation   His loop recorder was interrogated it is somewhat bothersome in that he is aware that occasionally

## 2015-02-21 NOTE — Patient Instructions (Signed)
Medication Instructions:  Your physician recommends that you continue on your current medications as directed. Please refer to the Current Medication list given to you today.  Labwork: None ordered  Testing/Procedures: None ordered  Follow-Up: Your physician wants you to follow-up in: 1 year with Dr. Caryl Comes.  You will receive a reminder letter in the mail two months in advance. If you don't receive a letter, please call our office to schedule the follow-up appointment.   Thank you for choosing [facility]!!

## 2015-03-03 LAB — CUP PACEART REMOTE DEVICE CHECK: Date Time Interrogation Session: 20160420040500

## 2015-03-08 ENCOUNTER — Encounter: Payer: Self-pay | Admitting: Internal Medicine

## 2015-03-09 ENCOUNTER — Ambulatory Visit (INDEPENDENT_AMBULATORY_CARE_PROVIDER_SITE_OTHER): Payer: BLUE CROSS/BLUE SHIELD | Admitting: *Deleted

## 2015-03-09 DIAGNOSIS — I639 Cerebral infarction, unspecified: Secondary | ICD-10-CM

## 2015-03-11 NOTE — Progress Notes (Signed)
Loop recorder 

## 2015-03-22 ENCOUNTER — Encounter (HOSPITAL_COMMUNITY): Payer: Self-pay | Admitting: Emergency Medicine

## 2015-03-22 ENCOUNTER — Emergency Department (HOSPITAL_COMMUNITY): Payer: BLUE CROSS/BLUE SHIELD

## 2015-03-22 ENCOUNTER — Inpatient Hospital Stay (HOSPITAL_COMMUNITY): Payer: BLUE CROSS/BLUE SHIELD | Admitting: Anesthesiology

## 2015-03-22 ENCOUNTER — Inpatient Hospital Stay (HOSPITAL_COMMUNITY)
Admission: EM | Admit: 2015-03-22 | Discharge: 2015-03-31 | DRG: 492 | Disposition: A | Discharge status: Rehabilitation Facility | Payer: BLUE CROSS/BLUE SHIELD | Attending: Orthopedic Surgery | Admitting: Orthopedic Surgery

## 2015-03-22 ENCOUNTER — Encounter (HOSPITAL_COMMUNITY)
Admission: EM | Disposition: A | Discharge status: Rehabilitation Facility | Payer: Self-pay | Source: Home / Self Care | Attending: Orthopedic Surgery

## 2015-03-22 DIAGNOSIS — D62 Acute posthemorrhagic anemia: Secondary | ICD-10-CM | POA: Diagnosis not present

## 2015-03-22 DIAGNOSIS — W11XXXA Fall on and from ladder, initial encounter: Secondary | ICD-10-CM | POA: Diagnosis present

## 2015-03-22 DIAGNOSIS — E785 Hyperlipidemia, unspecified: Secondary | ICD-10-CM | POA: Diagnosis present

## 2015-03-22 DIAGNOSIS — S82402S Unspecified fracture of shaft of left fibula, sequela: Secondary | ICD-10-CM | POA: Diagnosis not present

## 2015-03-22 DIAGNOSIS — S43005S Unspecified dislocation of left shoulder joint, sequela: Secondary | ICD-10-CM | POA: Diagnosis not present

## 2015-03-22 DIAGNOSIS — Z7982 Long term (current) use of aspirin: Secondary | ICD-10-CM

## 2015-03-22 DIAGNOSIS — Y92239 Unspecified place in hospital as the place of occurrence of the external cause: Secondary | ICD-10-CM

## 2015-03-22 DIAGNOSIS — S43015A Anterior dislocation of left humerus, initial encounter: Secondary | ICD-10-CM | POA: Diagnosis present

## 2015-03-22 DIAGNOSIS — R000 Tachycardia, unspecified: Secondary | ICD-10-CM | POA: Diagnosis not present

## 2015-03-22 DIAGNOSIS — S82899A Other fracture of unspecified lower leg, initial encounter for closed fracture: Secondary | ICD-10-CM

## 2015-03-22 DIAGNOSIS — M25562 Pain in left knee: Secondary | ICD-10-CM | POA: Diagnosis not present

## 2015-03-22 DIAGNOSIS — Z8673 Personal history of transient ischemic attack (TIA), and cerebral infarction without residual deficits: Secondary | ICD-10-CM | POA: Diagnosis not present

## 2015-03-22 DIAGNOSIS — Q211 Atrial septal defect: Secondary | ICD-10-CM

## 2015-03-22 DIAGNOSIS — S82202H Unspecified fracture of shaft of left tibia, subsequent encounter for open fracture type I or II with delayed healing: Secondary | ICD-10-CM | POA: Diagnosis not present

## 2015-03-22 DIAGNOSIS — S8292XA Unspecified fracture of left lower leg, initial encounter for closed fracture: Secondary | ICD-10-CM | POA: Diagnosis not present

## 2015-03-22 DIAGNOSIS — S82202B Unspecified fracture of shaft of left tibia, initial encounter for open fracture type I or II: Secondary | ICD-10-CM | POA: Diagnosis present

## 2015-03-22 DIAGNOSIS — S82872S Displaced pilon fracture of left tibia, sequela: Secondary | ICD-10-CM | POA: Diagnosis not present

## 2015-03-22 DIAGNOSIS — I63432 Cerebral infarction due to embolism of left posterior cerebral artery: Secondary | ICD-10-CM | POA: Diagnosis not present

## 2015-03-22 DIAGNOSIS — R21 Rash and other nonspecific skin eruption: Secondary | ICD-10-CM | POA: Diagnosis not present

## 2015-03-22 DIAGNOSIS — S82309A Unspecified fracture of lower end of unspecified tibia, initial encounter for closed fracture: Secondary | ICD-10-CM

## 2015-03-22 DIAGNOSIS — Z79899 Other long term (current) drug therapy: Secondary | ICD-10-CM | POA: Diagnosis not present

## 2015-03-22 DIAGNOSIS — T50905A Adverse effect of unspecified drugs, medicaments and biological substances, initial encounter: Secondary | ICD-10-CM | POA: Diagnosis not present

## 2015-03-22 DIAGNOSIS — Z95818 Presence of other cardiac implants and grafts: Secondary | ICD-10-CM

## 2015-03-22 DIAGNOSIS — M79605 Pain in left leg: Secondary | ICD-10-CM | POA: Diagnosis present

## 2015-03-22 DIAGNOSIS — S85412A Laceration of lesser saphenous vein at lower leg level, left leg, initial encounter: Secondary | ICD-10-CM | POA: Diagnosis present

## 2015-03-22 DIAGNOSIS — R404 Transient alteration of awareness: Secondary | ICD-10-CM

## 2015-03-22 DIAGNOSIS — E780 Pure hypercholesterolemia: Secondary | ICD-10-CM | POA: Diagnosis present

## 2015-03-22 DIAGNOSIS — I10 Essential (primary) hypertension: Secondary | ICD-10-CM | POA: Diagnosis present

## 2015-03-22 DIAGNOSIS — I6340 Cerebral infarction due to embolism of unspecified cerebral artery: Secondary | ICD-10-CM | POA: Insufficient documentation

## 2015-03-22 DIAGNOSIS — T1490XA Injury, unspecified, initial encounter: Secondary | ICD-10-CM

## 2015-03-22 DIAGNOSIS — I639 Cerebral infarction, unspecified: Secondary | ICD-10-CM | POA: Diagnosis not present

## 2015-03-22 DIAGNOSIS — S82402B Unspecified fracture of shaft of left fibula, initial encounter for open fracture type I or II: Secondary | ICD-10-CM

## 2015-03-22 DIAGNOSIS — S43006A Unspecified dislocation of unspecified shoulder joint, initial encounter: Secondary | ICD-10-CM

## 2015-03-22 DIAGNOSIS — S43005A Unspecified dislocation of left shoulder joint, initial encounter: Secondary | ICD-10-CM

## 2015-03-22 DIAGNOSIS — Y92008 Other place in unspecified non-institutional (private) residence as the place of occurrence of the external cause: Secondary | ICD-10-CM

## 2015-03-22 DIAGNOSIS — S82202C Unspecified fracture of shaft of left tibia, initial encounter for open fracture type IIIA, IIIB, or IIIC: Secondary | ICD-10-CM

## 2015-03-22 DIAGNOSIS — S82202S Unspecified fracture of shaft of left tibia, sequela: Secondary | ICD-10-CM | POA: Diagnosis not present

## 2015-03-22 DIAGNOSIS — W19XXXA Unspecified fall, initial encounter: Secondary | ICD-10-CM

## 2015-03-22 DIAGNOSIS — S82872C Displaced pilon fracture of left tibia, initial encounter for open fracture type IIIA, IIIB, or IIIC: Principal | ICD-10-CM | POA: Diagnosis present

## 2015-03-22 DIAGNOSIS — Q2112 Patent foramen ovale: Secondary | ICD-10-CM | POA: Diagnosis present

## 2015-03-22 DIAGNOSIS — Z419 Encounter for procedure for purposes other than remedying health state, unspecified: Secondary | ICD-10-CM

## 2015-03-22 DIAGNOSIS — S82402C Unspecified fracture of shaft of left fibula, initial encounter for open fracture type IIIA, IIIB, or IIIC: Secondary | ICD-10-CM

## 2015-03-22 DIAGNOSIS — G510 Bell's palsy: Secondary | ICD-10-CM | POA: Diagnosis present

## 2015-03-22 DIAGNOSIS — S8292XS Unspecified fracture of left lower leg, sequela: Secondary | ICD-10-CM | POA: Diagnosis not present

## 2015-03-22 HISTORY — PX: EXTERNAL FIXATION LEG: SHX1549

## 2015-03-22 HISTORY — PX: I&D EXTREMITY: SHX5045

## 2015-03-22 HISTORY — DX: Unspecified dislocation of left shoulder joint, initial encounter: S43.005A

## 2015-03-22 HISTORY — PX: SHOULDER CLOSED REDUCTION: SHX1051

## 2015-03-22 LAB — CUP PACEART REMOTE DEVICE CHECK: Date Time Interrogation Session: 20160517040500

## 2015-03-22 LAB — CBC
HCT: 43.8 % (ref 39.0–52.0)
Hemoglobin: 14.6 g/dL (ref 13.0–17.0)
MCH: 28.6 pg (ref 26.0–34.0)
MCHC: 33.3 g/dL (ref 30.0–36.0)
MCV: 85.7 fL (ref 78.0–100.0)
Platelets: 225 10*3/uL (ref 150–400)
RBC: 5.11 MIL/uL (ref 4.22–5.81)
RDW: 13.7 % (ref 11.5–15.5)
WBC: 13.7 10*3/uL — ABNORMAL HIGH (ref 4.0–10.5)

## 2015-03-22 LAB — SAMPLE TO BLOOD BANK

## 2015-03-22 LAB — COMPREHENSIVE METABOLIC PANEL
ALT: 35 U/L (ref 17–63)
AST: 29 U/L (ref 15–41)
Albumin: 4.2 g/dL (ref 3.5–5.0)
Alkaline Phosphatase: 69 U/L (ref 38–126)
Anion gap: 10 (ref 5–15)
BILIRUBIN TOTAL: 0.5 mg/dL (ref 0.3–1.2)
BUN: 18 mg/dL (ref 6–20)
CHLORIDE: 111 mmol/L (ref 101–111)
CO2: 20 mmol/L — ABNORMAL LOW (ref 22–32)
CREATININE: 1.2 mg/dL (ref 0.61–1.24)
Calcium: 9.4 mg/dL (ref 8.9–10.3)
GFR calc Af Amer: >60 mL/min (ref >60)
GFR calc non Af Amer: >60 mL/min (ref >60)
Glucose, Bld: 171 mg/dL — ABNORMAL HIGH (ref 65–99)
POTASSIUM: 3.9 mmol/L (ref 3.5–5.1)
SODIUM: 141 mmol/L (ref 135–145)
Total Protein: 6.8 g/dL (ref 6.5–8.1)

## 2015-03-22 LAB — CDS SEROLOGY

## 2015-03-22 LAB — ETHANOL

## 2015-03-22 LAB — PROTIME-INR
INR: 1.05 (ref 0.00–1.49)
Prothrombin Time: 13.9 seconds (ref 11.6–15.2)

## 2015-03-22 SURGERY — CLOSED REDUCTION, SHOULDER
Anesthesia: General | Site: Shoulder | Laterality: Left

## 2015-03-22 MED ORDER — SUCCINYLCHOLINE CHLORIDE 20 MG/ML IJ SOLN
INTRAMUSCULAR | Status: DC | PRN
Start: 2015-03-22 — End: 2015-03-23
  Administered 2015-03-22: 100 mg via INTRAVENOUS

## 2015-03-22 MED ORDER — PROPOFOL 10 MG/ML IV BOLUS
INTRAVENOUS | Status: DC | PRN
  Administered 2015-03-22: 150 mg via INTRAVENOUS
  Administered 2015-03-22: 50 mg via INTRAVENOUS

## 2015-03-22 MED ORDER — SUCCINYLCHOLINE CHLORIDE 20 MG/ML IJ SOLN
INTRAMUSCULAR | Status: AC
  Filled 2015-03-22: qty 1

## 2015-03-22 MED ORDER — MIDAZOLAM HCL 2 MG/2ML IJ SOLN
INTRAMUSCULAR | Status: DC | PRN
  Administered 2015-03-22: 2 mg via INTRAVENOUS

## 2015-03-22 MED ORDER — FENTANYL CITRATE (PF) 250 MCG/5ML IJ SOLN
INTRAMUSCULAR | Status: DC | PRN
  Administered 2015-03-22: 50 ug via INTRAVENOUS
  Administered 2015-03-22: 100 ug via INTRAVENOUS
  Administered 2015-03-23 (×2): 50 ug via INTRAVENOUS

## 2015-03-22 MED ORDER — CEFAZOLIN SODIUM-DEXTROSE 2-3 GM-% IV SOLR
INTRAVENOUS | Status: DC | PRN
  Administered 2015-03-22: 2 g via INTRAVENOUS

## 2015-03-22 MED ORDER — SODIUM CHLORIDE 0.9 % IR SOLN
Status: DC | PRN
  Administered 2015-03-22 (×2): 3000 mL

## 2015-03-22 MED ORDER — CEFAZOLIN SODIUM-DEXTROSE 2-3 GM-% IV SOLR
2.0000 g | Freq: Once | INTRAVENOUS | Status: AC
  Administered 2015-03-22: 2 g via INTRAVENOUS
  Filled 2015-03-22: qty 50

## 2015-03-22 MED ORDER — LIDOCAINE HCL (CARDIAC) 20 MG/ML IV SOLN
INTRAVENOUS | Status: DC | PRN
  Administered 2015-03-22: 100 mg via INTRAVENOUS

## 2015-03-22 MED ORDER — TETANUS-DIPHTH-ACELL PERTUSSIS 5-2.5-18.5 LF-MCG/0.5 IM SUSP
0.5000 mL | Freq: Once | INTRAMUSCULAR | Status: AC
  Administered 2015-03-22: 0.5 mL via INTRAMUSCULAR

## 2015-03-22 MED ORDER — HYDROMORPHONE HCL 1 MG/ML IJ SOLN
1.0000 mg | Freq: Once | INTRAMUSCULAR | Status: AC
  Administered 2015-03-22: 1 mg via INTRAVENOUS

## 2015-03-22 MED ORDER — TETANUS-DIPHTH-ACELL PERTUSSIS 5-2.5-18.5 LF-MCG/0.5 IM SUSP
INTRAMUSCULAR | Status: AC
  Filled 2015-03-22: qty 0.5

## 2015-03-22 MED ORDER — ONDANSETRON HCL 4 MG/2ML IJ SOLN
INTRAMUSCULAR | Status: DC | PRN
  Administered 2015-03-22: 4 mg via INTRAVENOUS

## 2015-03-22 MED ORDER — LIDOCAINE HCL (CARDIAC) 20 MG/ML IV SOLN
INTRAVENOUS | Status: AC
  Filled 2015-03-22: qty 5

## 2015-03-22 MED ORDER — FENTANYL CITRATE (PF) 250 MCG/5ML IJ SOLN
INTRAMUSCULAR | Status: AC
  Filled 2015-03-22: qty 5

## 2015-03-22 MED ORDER — EPHEDRINE SULFATE 50 MG/ML IJ SOLN
INTRAMUSCULAR | Status: DC | PRN
  Administered 2015-03-22 (×2): 10 mg via INTRAVENOUS

## 2015-03-22 MED ORDER — SODIUM CHLORIDE 0.9 % IV SOLN
INTRAVENOUS | Status: DC | PRN
  Administered 2015-03-22: 22:00:00 via INTRAVENOUS

## 2015-03-22 MED ORDER — PROPOFOL 10 MG/ML IV BOLUS
INTRAVENOUS | Status: AC
  Filled 2015-03-22: qty 20

## 2015-03-22 MED ORDER — PHENYLEPHRINE 40 MCG/ML (10ML) SYRINGE FOR IV PUSH (FOR BLOOD PRESSURE SUPPORT)
PREFILLED_SYRINGE | INTRAVENOUS | Status: AC
  Filled 2015-03-22: qty 10

## 2015-03-22 MED ORDER — HYDROMORPHONE HCL 1 MG/ML IJ SOLN: 1.0000 mg | INTRAMUSCULAR | Status: DC | PRN

## 2015-03-22 MED ORDER — PHENYLEPHRINE HCL 10 MG/ML IJ SOLN
INTRAMUSCULAR | Status: DC | PRN
  Administered 2015-03-22 (×2): 80 ug via INTRAVENOUS

## 2015-03-22 MED ORDER — HYDROMORPHONE HCL 1 MG/ML IJ SOLN
INTRAMUSCULAR | Status: AC
  Filled 2015-03-22: qty 1

## 2015-03-22 MED ORDER — LACTATED RINGERS IV SOLN
INTRAVENOUS | Status: DC | PRN
  Administered 2015-03-22: 23:00:00 via INTRAVENOUS

## 2015-03-22 MED ORDER — MIDAZOLAM HCL 2 MG/2ML IJ SOLN
INTRAMUSCULAR | Status: AC
  Filled 2015-03-22: qty 2

## 2015-03-22 SURGICAL SUPPLY — 76 items
11MM X 350MM BAR ×3 IMPLANT
BANDAGE ELASTIC 4 VELCRO ST LF (GAUZE/BANDAGES/DRESSINGS) IMPLANT
BANDAGE ELASTIC 6 VELCRO ST LF (GAUZE/BANDAGES/DRESSINGS) ×5 IMPLANT
BANDAGE ESMARK 6X9 LF (GAUZE/BANDAGES/DRESSINGS) ×3 IMPLANT
BAR EXFX 250X11 NS LF (MISCELLANEOUS) ×9
BAR EXFX 350X11 NS LF (EXFIX) ×3
BAR GLASS FIBER EXFX 11X250 (MISCELLANEOUS) ×9 IMPLANT
BAR GLASS FIBER EXFX 11X350 (EXFIX) ×1 IMPLANT
BNDG CMPR 9X6 STRL LF SNTH (GAUZE/BANDAGES/DRESSINGS)
BNDG COHESIVE 6X5 TAN STRL LF (GAUZE/BANDAGES/DRESSINGS) ×5 IMPLANT
BNDG ESMARK 6X9 LF (GAUZE/BANDAGES/DRESSINGS)
BNDG GAUZE ELAST 4 BULKY (GAUZE/BANDAGES/DRESSINGS) ×8 IMPLANT
BRUSH SCRUB DISP (MISCELLANEOUS) ×4 IMPLANT
CLAMP BLUE BAR TO BAR (MISCELLANEOUS) ×12 IMPLANT
CLAMP BLUE BAR TO PIN (MISCELLANEOUS) ×8 IMPLANT
CLEANER TIP ELECTROSURG 2X2 (MISCELLANEOUS) ×1 IMPLANT
CLOSURE WOUND 1/2 X4 (GAUZE/BANDAGES/DRESSINGS)
COVER SURGICAL LIGHT HANDLE (MISCELLANEOUS) ×10 IMPLANT
CUFF TOURNIQUET SINGLE 18IN (TOURNIQUET CUFF) IMPLANT
CUFF TOURNIQUET SINGLE 24IN (TOURNIQUET CUFF) IMPLANT
CUFF TOURNIQUET SINGLE 34IN LL (TOURNIQUET CUFF) IMPLANT
DRAPE C-ARM 42X72 X-RAY (DRAPES) ×2 IMPLANT
DRAPE C-ARMOR (DRAPES) ×2 IMPLANT
DRAPE U-SHAPE 47X51 STRL (DRAPES) ×5 IMPLANT
DRSG ADAPTIC 3X8 NADH LF (GAUZE/BANDAGES/DRESSINGS) ×3 IMPLANT
DRSG PAD ABDOMINAL 8X10 ST (GAUZE/BANDAGES/DRESSINGS) ×2 IMPLANT
ELECT REM PT RETURN 9FT ADLT (ELECTROSURGICAL) ×5
ELECTRODE REM PT RTRN 9FT ADLT (ELECTROSURGICAL) ×3 IMPLANT
EVACUATOR 1/8 PVC DRAIN (DRAIN) IMPLANT
GAUZE SPONGE 4X4 12PLY STRL (GAUZE/BANDAGES/DRESSINGS) ×5 IMPLANT
GAUZE XEROFORM 5X9 LF (GAUZE/BANDAGES/DRESSINGS) ×4 IMPLANT
GLOVE BIO SURGEON STRL SZ7.5 (GLOVE) ×3 IMPLANT
GLOVE BIO SURGEON STRL SZ8 (GLOVE) ×5 IMPLANT
GLOVE BIOGEL PI IND STRL 7.5 (GLOVE) ×1 IMPLANT
GLOVE BIOGEL PI IND STRL 8 (GLOVE) ×3 IMPLANT
GLOVE BIOGEL PI INDICATOR 7.5 (GLOVE)
GLOVE BIOGEL PI INDICATOR 8 (GLOVE) ×2
GOWN STRL REUS W/ TWL LRG LVL3 (GOWN DISPOSABLE) ×5 IMPLANT
GOWN STRL REUS W/ TWL XL LVL3 (GOWN DISPOSABLE) ×3 IMPLANT
GOWN STRL REUS W/TWL LRG LVL3 (GOWN DISPOSABLE) ×5
GOWN STRL REUS W/TWL XL LVL3 (GOWN DISPOSABLE) ×5
HALF PIN 5.0X160 (PIN) ×4 IMPLANT
HANDPIECE INTERPULSE COAX TIP (DISPOSABLE) ×5
KIT BASIN OR (CUSTOM PROCEDURE TRAY) ×5 IMPLANT
KIT ROOM TURNOVER OR (KITS) ×5 IMPLANT
MANIFOLD NEPTUNE II (INSTRUMENTS) ×5 IMPLANT
NEEDLE 22X1 1/2 (OR ONLY) (NEEDLE) IMPLANT
NS IRRIG 1000ML POUR BTL (IV SOLUTION) ×5 IMPLANT
PACK ORTHO EXTREMITY (CUSTOM PROCEDURE TRAY) ×5 IMPLANT
PAD ARMBOARD 7.5X6 YLW CONV (MISCELLANEOUS) ×10 IMPLANT
PADDING CAST COTTON 6X4 STRL (CAST SUPPLIES) ×6 IMPLANT
PIN CLAMP 2BAR 75MM BLUE (PIN) ×3 IMPLANT
PIN TRANSFIXING 5.0 (PIN) ×3 IMPLANT
SET HNDPC FAN SPRY TIP SCT (DISPOSABLE) IMPLANT
SLING ARM FOAM STRAP LRG (SOFTGOODS) ×2 IMPLANT
SPONGE LAP 18X18 X RAY DECT (DISPOSABLE) ×5 IMPLANT
SPONGE SCRUB IODOPHOR (GAUZE/BANDAGES/DRESSINGS) ×5 IMPLANT
STAPLER VISISTAT 35W (STAPLE) IMPLANT
STOCKINETTE IMPERVIOUS LG (DRAPES) ×5 IMPLANT
STRIP CLOSURE SKIN 1/2X4 (GAUZE/BANDAGES/DRESSINGS) IMPLANT
SUCTION FRAZIER TIP 10 FR DISP (SUCTIONS) ×2 IMPLANT
SUT ETHILON 2 0 FS 18 (SUTURE) ×4 IMPLANT
SUT ETHILON 2 0 PSLX (SUTURE) ×5 IMPLANT
SUT ETHILON 3 0 PS 1 (SUTURE) IMPLANT
SUT VIC AB 0 CT1 27 (SUTURE)
SUT VIC AB 0 CT1 27XBRD ANBCTR (SUTURE) ×6 IMPLANT
SUT VIC AB 2-0 CT1 27 (SUTURE)
SUT VIC AB 2-0 CT1 TAPERPNT 27 (SUTURE) ×6 IMPLANT
SYR CONTROL 10ML LL (SYRINGE) IMPLANT
TOWEL OR 17X24 6PK STRL BLUE (TOWEL DISPOSABLE) ×14 IMPLANT
TOWEL OR 17X26 10 PK STRL BLUE (TOWEL DISPOSABLE) ×10 IMPLANT
TUBE CONNECTING 12'X1/4 (SUCTIONS) ×1
TUBE CONNECTING 12X1/4 (SUCTIONS) ×4 IMPLANT
UNDERPAD 30X30 INCONTINENT (UNDERPADS AND DIAPERS) ×5 IMPLANT
WATER STERILE IRR 1000ML POUR (IV SOLUTION) ×10 IMPLANT
YANKAUER SUCT BULB TIP NO VENT (SUCTIONS) ×5 IMPLANT

## 2015-03-22 NOTE — ED Provider Notes (Signed)
CSN: [id number]     Arrival date & time 03/22/15  2026 History   First MD Initiated Contact with Patient 03/22/15 2028     Chief Complaint  Patient presents with  . Fall     (Consider location/radiation/quality/duration/timing/severity/associated sxs/prior Treatment) HPI The patient states he fell off approximately a 6 foot ladder. He lost his balance and landed on his left shoulder and leg. He denies hitting his head or having headache. He denies loss of consciousness. Denies chest pain or shortness of breath. He states he had significant injury to his ankle and he had to crawl about 100 yards back to his home to contact help. He denies neck pain. He denies that he had any weakness numbness or tingling in extremities however his use was very much limited by pain associated with injury. Patient was his last meal was in the morning. He denies any drug allergies. Past Medical History  Diagnosis Date  . Bell's palsy 1980's; [id number]; 2000's; 12/2012; 03/2013    "multiple times" (04/21/2013)  . TIA (transient ischemic attack) 04/21/2013  . Stroke   . Hypercholesteremia    Past Surgical History  Procedure Laterality Date  . Tee without cardioversion N/A 04/24/2013    Procedure: TRANSESOPHAGEAL ECHOCARDIOGRAM (TEE);  Surgeon: Thayer Headings, MD;  Location: [facility];  Service: Cardiovascular;  Laterality: N/A;  . Implanted loop recorder    . Colonoscopy N/A 07/13/2013    Procedure: COLONOSCOPY;  Surgeon: Danie Binder, MD;  Location: [facility];  Service: Endoscopy;  Laterality: N/A;  8:30 AM  . Loop recorder implant N/A 04/24/2013    Procedure: LOOP RECORDER IMPLANT;  Surgeon: Deboraha Sprang, MD;  Location: [facility] CATH LAB;  Service: Cardiovascular;  Laterality: N/A;   Family History  Problem Relation Age of Onset  . Heart disease Mother   . Alcohol abuse Mother   . Colon cancer Neg Hx    History  Substance Use Topics  . Smoking status: Never Smoker   . Smokeless tobacco: Never Used  .  Alcohol Use: Yes     Comment: 03/22/2013 "glass of wine couple times/yr"    Review of Systems  10 Systems reviewed and are negative for acute change except as noted in the HPI.   Allergies  Review of patient's allergies indicates no known allergies.  Home Medications   Prior to Admission medications   Medication Sig Start Date End Date Taking? Authorizing Provider  aspirin 325 MG tablet Take 325 mg by mouth daily.   Yes Historical Provider, MD  simvastatin (ZOCOR) 10 MG tablet Take 10 mg by mouth daily.   Yes Historical Provider, MD  acyclovir (ZOVIRAX) 800 MG tablet Take 1 tablet (800 mg total) by mouth 4 (four) times daily. Patient not taking: Reported on 03/22/2015 12/21/14   [name], MD  aspirin 325 MG tablet Take 1 tablet (325 mg total) by mouth daily. Patient not taking: Reported on 03/22/2015 04/24/13   Hosie Poisson, MD  predniSONE (DELTASONE) 10 MG tablet 6 tabs on day 1, 5 tabs on day 2, 4 tabs on day 3, 3 tabs on day 4, 2 tabs on day 5, and 1 tab on day 6. Patient not taking: Reported on 03/22/2015 12/21/14   [name], MD  simvastatin (ZOCOR) 10 MG tablet Take 1 tablet (10 mg total) by mouth at bedtime. Patient not taking: Reported on 03/22/2015 11/24/14   [name], MD  zoster vaccine live, PF, (ZOSTAVAX) 40981 UNT/0.65ML injection Inject 19,400  Units into the skin once. Patient not taking: Reported on 02/21/2015 11/24/14   [name], MD   BP 127/95 mmHg  Pulse 91  Temp(Src) 97.4 F (36.3 C) (Oral)  Resp 21  Ht 5\' 9"  (1.753 m)  Wt 185 lb (83.915 kg)  BMI 27.31 kg/m2  SpO2 94% Physical Exam  Constitutional: He is oriented to person, place, and time. He appears well-developed and well-nourished.  Patient is alert with GCS of 15. He has no respiratory distress. He does appear to be in pain. He is cooperative and appropriate.  HENT:  Head: Normocephalic and atraumatic.  Right Ear: External ear normal.  Left Ear: External ear normal.  Nose: Nose normal.   Mouth/Throat: Oropharynx is clear and moist.  Eyes: EOM are normal. Pupils are equal, round, and reactive to light.  Neck: Neck supple.  No C-spine nurse palpation.  Cardiovascular: Normal rate, regular rhythm, normal heart sounds and intact distal pulses.   Pulmonary/Chest: Effort normal and breath sounds normal. He exhibits no tenderness.  Abdominal: Soft. Bowel sounds are normal. He exhibits no distension. There is no tenderness. There is no rebound and no guarding.  Musculoskeletal: He exhibits tenderness. He exhibits no edema.  Patient has step-off deformity of the left shoulder consistent with dislocation. Radial pulses intact. There is not associated contusion or skin disruption. Patient's movement of the digits and grip is intact.  Patient has severe open wound to the left lower leg. The medial aspect of the lower leg has linear sheared bone protrusion of the tibia with the foot laterally displaced. The dorsalis pedis pulses intact and strong. There is very limited active bleeding.  Neurological: He is alert and oriented to person, place, and time. He has normal strength. Coordination normal. GCS eye subscore is 4. GCS verbal subscore is 5. GCS motor subscore is 6.  Skin: Skin is warm, dry and intact.  Psychiatric: He has a normal mood and affect.    ED Course  Procedures (including critical care time) Labs Review Labs Reviewed  COMPREHENSIVE METABOLIC PANEL - Abnormal; Notable for the following:    CO2 20 (*)    Glucose, Bld 171 (*)    All other components within normal limits  CBC - Abnormal; Notable for the following:    WBC 13.7 (*)    All other components within normal limits  CDS SEROLOGY  ETHANOL  PROTIME-INR  SAMPLE TO BLOOD BANK    Imaging Review Dg Cervical Spine 1 View  03/22/2015   CLINICAL DATA:  [place] 5 feet from a ladder.  Initial encounter.  EXAM: DG CERVICAL SPINE - 1 VIEW  COMPARISON:  None.  FINDINGS: A single cross-table lateral portable view of the  cervical spine demonstrates grossly intact alignment of the vertebral bodies down to the mid C6 level. The cervical spine is not visible below this level. No fracture or malalignment is evident in the visible portions of the cervical spine. No prevertebral soft tissue swelling is evident. Moderate degenerative disc changes are incidentally noted at C5-6.  IMPRESSION: Visible portions of the C-spine from craniocervical junction to mid C6 are grossly negative for acute fracture or malalignment on this single portable view. Below C6, the cervical spine is not visible on this image.   Electronically Signed   By: Andreas Newport M.D.   On: 03/22/2015 21:35   Dg Pelvis Portable  03/22/2015   CLINICAL DATA:  [place] 5 feet from a ladder.  Initial encounter.  EXAM: PORTABLE PELVIS 1-2 VIEWS  COMPARISON:  None.  FINDINGS: There is no evidence of pelvic fracture or diastasis. No pelvic bone lesions are seen.  IMPRESSION: Negative.   Electronically Signed   By: Andreas Newport M.D.   On: 03/22/2015 21:33   Dg Chest Portable 1 View  03/22/2015   CLINICAL DATA:  Fall 5 feet from ladder.  Left shoulder pain.  EXAM: PORTABLE CHEST - 1 VIEW  COMPARISON:  04/21/2013  FINDINGS: Low lung volumes which accentuates heart size, likely within normal limits. No confluent airspace opacities, effusions or pneumothorax.  Findings suspicious for anterior left shoulder dislocation.  IMPRESSION: No acute cardiopulmonary disease.  Low lung volumes.  Probable left anterior shoulder dislocation.   Electronically Signed   By: Rolm Baptise M.D.   On: 03/22/2015 21:32   Dg Shoulder Left Port  03/22/2015   CLINICAL DATA:  64 year old male status post fall 5 feet from ladder  EXAM: LEFT SHOULDER - 1 VIEW  COMPARISON:  None.  FINDINGS: Positive for anterior subcoracoid dislocation the glenohumeral joint. No definite fracture on these views. Implanted loop recorder projects over the left chest. Inspiratory volumes are low.  IMPRESSION: Anterior  subcoracoid shoulder dislocation.  No definite fracture   Electronically Signed   By: Jacqulynn Cadet M.D.   On: 03/22/2015 21:33   Dg Tibia/fibula Left Port  03/22/2015   CLINICAL DATA:  Fall 5 3 from ladder.  Left lower leg pain.  EXAM: PORTABLE LEFT TIBIA AND FIBULA - 2 VIEW  COMPARISON:  None.  FINDINGS: Comminuted fractures noted through the distal left tibia and fibula. Marked lateral displacement of the distal fracture fragments and foot relative to the tibia and fibula.  IMPRESSION: Comminuted distal tibial and fibular fractures with marked lateral displacement.   Electronically Signed   By: Rolm Baptise M.D.   On: 03/22/2015 21:34     EKG Interpretation None     Consult to orthopedics me by Dr. Laneta Simmers. Orthopedics to the emergency department to evaluate the patient and take the OR for definitive management. Consultation to trauma surgeon Donne Hazel by Dr. Laneta Simmers. At this time based on injuries is appropriate for orthopedic management.  CRITICAL CARE Performed by: Charlesetta Shanks   Total critical care time: 45  Critical care time was exclusive of separately billable procedures and treating other patients.  Critical care was necessary to treat or prevent imminent or life-threatening deterioration.  Critical care was time spent personally by me on the following activities: development of treatment plan with patient and/or surrogate as well as nursing, discussions with consultants, evaluation of patient's response to treatment, examination of patient, obtaining history from patient or surrogate, ordering and performing treatments and interventions, ordering and review of laboratory studies, ordering and review of radiographic studies, pulse oximetry and re-evaluation of patient's condition. MDM   Final diagnoses:  Shoulder dislocation, left, initial encounter  Open fracture of tibia and fibula, left, type III, initial encounter  Fall, initial encounter   Patient presents as outlined  above with fall from approximately 6 feet height. Assessment for head and neck, thorax\abdominal injury does not show associated injury pattern. Orthopedic injuries a shoulder dislocation and open tib-fib fracture are identified that have been taken to the OR for management.    Charlesetta Shanks, MD 03/23/15 0002

## 2015-03-22 NOTE — ED Notes (Signed)
Xray at the bedside.

## 2015-03-22 NOTE — ED Notes (Signed)
Per EMS, patient fell off ladder 6-34ft tall. Open long bone fx in left tib/fib and left shoulder pain. 10 mg of morphine, 18g in left wrist. 142/84, p 106, rr 14. spo2 98%.

## 2015-03-22 NOTE — Anesthesia Preprocedure Evaluation (Addendum)
Anesthesia Evaluation  Patient identified by MRN, date of birth, ID band Patient awake    Reviewed: Allergy & Precautions, NPO status , Patient's Chart, lab work & pertinent test results  Airway Mallampati: II  TM Distance: >3 FB Neck ROM: Full    Dental  (+) Teeth Intact, Dental Advisory Given   Pulmonary neg pulmonary ROS,  breath sounds clear to auscultation        Cardiovascular negative cardio ROS  Rhythm:Regular Rate:Normal     Neuro/Psych Hx Bell's palsy CVA    GI/Hepatic negative GI ROS, Neg liver ROS,   Endo/Other    Renal/GU negative Renal ROS     Musculoskeletal   Abdominal   Peds  Hematology negative hematology ROS (+)   Anesthesia Other Findings   Reproductive/Obstetrics                           Anesthesia Physical Anesthesia Plan  ASA: II and emergent  Anesthesia Plan: General   Post-op Pain Management:    Induction: Intravenous  Airway Management Planned: Oral ETT  Additional Equipment:   Intra-op Plan:   Post-operative Plan: Extubation in OR  Informed Consent: I have reviewed the patients History and Physical, chart, labs and discussed the procedure including the risks, benefits and alternatives for the proposed anesthesia with the patient or authorized representative who has indicated his/her understanding and acceptance.   Dental advisory given  Plan Discussed with: CRNA  Anesthesia Plan Comments:         Anesthesia Quick Evaluation

## 2015-03-22 NOTE — Anesthesia Procedure Notes (Signed)
Procedure Name: Intubation Date/Time: 03/22/2015 10:43 PM Performed by: Valetta Fuller Pre-anesthesia Checklist: Patient identified, Emergency Drugs available, Suction available and Patient being monitored Patient Re-evaluated:Patient Re-evaluated prior to inductionOxygen Delivery Method: Circle system utilized Preoxygenation: Pre-oxygenation with 100% oxygen Intubation Type: IV induction, Rapid sequence and Cricoid Pressure applied Laryngoscope Size: Miller and 2 Grade View: Grade II Tube type: Oral Tube size: 7.5 mm Number of attempts: 1 Airway Equipment and Method: Stylet Placement Confirmation: ETT inserted through vocal cords under direct vision,  positive ETCO2 and breath sounds checked- equal and bilateral Secured at: 23 cm Tube secured with: Tape Dental Injury: Teeth and Oropharynx as per pre-operative assessment

## 2015-03-22 NOTE — H&P (Signed)
Justin [name] is an 64 y.o. male.    Chief Complaint: fall from a ladder, left shoulder pain, left lower leg pain  HPI: 64 y/o male alert oriented and appropriate fell from a ladder earlier today injuring left shoulder and left lower extremity. Pt had to crawl from his barn back to his house to get help. Presents with obvious left tib/fib fracture that is open medially and left shoulder pain with obvious anterior dislocation. Denies any LOC syncope or other issues. Denies any other injuries other than left shoulder and left leg. Takes aspirin daily with no other anticoagulants.  PCP:  [name], MD  PMH: Past Medical History  Diagnosis Date  . Bell's palsy 1980's; [id number]; 2000's; 12/2012; 03/2013    "multiple times" (04/21/2013)  . TIA (transient ischemic attack) 04/21/2013  . Stroke   . Hypercholesteremia     PSH: Past Surgical History  Procedure Laterality Date  . Tee without cardioversion N/A 04/24/2013    Procedure: TRANSESOPHAGEAL ECHOCARDIOGRAM (TEE);  Surgeon: Thayer Headings, MD;  Location: [facility];  Service: Cardiovascular;  Laterality: N/A;  . Implanted loop recorder    . Colonoscopy N/A 07/13/2013    Procedure: COLONOSCOPY;  Surgeon: Danie Binder, MD;  Location: [facility];  Service: Endoscopy;  Laterality: N/A;  8:30 AM  . Loop recorder implant N/A 04/24/2013    Procedure: LOOP RECORDER IMPLANT;  Surgeon: Deboraha Sprang, MD;  Location: [facility] CATH LAB;  Service: Cardiovascular;  Laterality: N/A;    Social History:  reports that he has never smoked. He has never used smokeless tobacco. He reports that he drinks alcohol. He reports that he does not use illicit drugs.  Allergies:  No Known Allergies  Medications: Current Facility-Administered Medications  Medication Dose Route Frequency Provider Last Rate Last Dose  . HYDROmorphone (DILAUDID) injection 1 mg  1 mg Intravenous Q2H PRN Merla Riches, PA-C       Current Outpatient Prescriptions  Medication Sig  Dispense Refill  . aspirin 325 MG tablet Take 325 mg by mouth daily.    . simvastatin (ZOCOR) 10 MG tablet Take 10 mg by mouth daily.    Marland Kitchen acyclovir (ZOVIRAX) 800 MG tablet Take 1 tablet (800 mg total) by mouth 4 (four) times daily. (Patient not taking: Reported on 03/22/2015) 20 tablet 1  . aspirin 325 MG tablet Take 1 tablet (325 mg total) by mouth daily. (Patient not taking: Reported on 03/22/2015) 30 tablet 1  . predniSONE (DELTASONE) 10 MG tablet 6 tabs on day 1, 5 tabs on day 2, 4 tabs on day 3, 3 tabs on day 4, 2 tabs on day 5, and 1 tab on day 6. (Patient not taking: Reported on 03/22/2015) 21 tablet 0  . simvastatin (ZOCOR) 10 MG tablet Take 1 tablet (10 mg total) by mouth at bedtime. (Patient not taking: Reported on 03/22/2015) 90 tablet 3  . zoster vaccine live, PF, (ZOSTAVAX) 07121 UNT/0.65ML injection Inject 19,400 Units into the skin once. (Patient not taking: Reported on 02/21/2015) 1 each 0    Results for orders placed or performed during the hospital encounter of 03/22/15 (from the past 48 hour(s))  CDS serology     Status: None   Collection Time: 03/22/15  8:36 PM  Result Value Ref Range   CDS serology specimen      SPECIMEN WILL BE HELD FOR 14 DAYS IF TESTING IS REQUIRED  Comprehensive metabolic panel     Status: Abnormal   Collection Time: 03/22/15  8:36 PM  Result Value Ref Range   Sodium 141 135 - 145 mmol/L   Potassium 3.9 3.5 - 5.1 mmol/L   Chloride 111 101 - 111 mmol/L   CO2 20 (L) 22 - 32 mmol/L   Glucose, Bld 171 (H) 65 - 99 mg/dL   BUN 18 6 - 20 mg/dL   Creatinine, Ser 1.20 0.61 - 1.24 mg/dL   Calcium 9.4 8.9 - 10.3 mg/dL   Total Protein 6.8 6.5 - 8.1 g/dL   Albumin 4.2 3.5 - 5.0 g/dL   AST 29 15 - 41 U/L   ALT 35 17 - 63 U/L   Alkaline Phosphatase 69 38 - 126 U/L   Total Bilirubin 0.5 0.3 - 1.2 mg/dL   GFR calc non Af Amer >60 >60 mL/min   GFR calc Af Amer >60 >60 mL/min    Comment: (NOTE) The eGFR has been calculated using the CKD EPI equation. This  calculation has not been validated in all clinical situations. eGFR's persistently <60 mL/min signify possible Chronic Kidney Disease.    Anion gap 10 5 - 15  CBC     Status: Abnormal   Collection Time: 03/22/15  8:36 PM  Result Value Ref Range   WBC 13.7 (H) 4.0 - 10.5 K/uL   RBC 5.11 4.22 - 5.81 MIL/uL   Hemoglobin 14.6 13.0 - 17.0 g/dL   HCT 43.8 39.0 - 52.0 %   MCV 85.7 78.0 - 100.0 fL   MCH 28.6 26.0 - 34.0 pg   MCHC 33.3 30.0 - 36.0 g/dL   RDW 13.7 11.5 - 15.5 %   Platelets 225 150 - 400 K/uL  Ethanol     Status: None   Collection Time: 03/22/15  8:36 PM  Result Value Ref Range   Alcohol, Ethyl (B) <5 <5 mg/dL    Comment:        LOWEST DETECTABLE LIMIT FOR SERUM ALCOHOL IS 5 mg/dL FOR MEDICAL PURPOSES ONLY   Protime-INR     Status: None   Collection Time: 03/22/15  8:36 PM  Result Value Ref Range   Prothrombin Time 13.9 11.6 - 15.2 seconds   INR 1.05 0.00 - 1.49  Sample to Blood Bank     Status: None   Collection Time: 03/22/15  8:36 PM  Result Value Ref Range   Blood Bank Specimen SAMPLE AVAILABLE FOR TESTING    Sample Expiration 03/23/2015    Dg Cervical Spine 1 View  03/22/2015   CLINICAL DATA:  [place] [address] 5 feet from a ladder.  Initial encounter.  EXAM: DG CERVICAL SPINE - 1 VIEW  COMPARISON:  None.  FINDINGS: A single cross-table lateral portable view of the cervical spine demonstrates grossly intact alignment of the vertebral bodies down to the mid C6 level. The cervical spine is not visible below this level. No fracture or malalignment is evident in the visible portions of the cervical spine. No prevertebral soft tissue swelling is evident. Moderate degenerative disc changes are incidentally noted at C5-6.  IMPRESSION: Visible portions of the C-spine from craniocervical junction to mid C6 are grossly negative for acute fracture or malalignment on this single portable view. Below C6, the cervical spine is not visible on this image.   Electronically Signed   By: Andreas Newport M.D.   On: 03/22/2015 21:35   Dg Pelvis Portable  03/22/2015   CLINICAL DATA:  [place] 5 feet from a ladder.  Initial encounter.  EXAM: PORTABLE PELVIS 1-2 VIEWS  COMPARISON:  None.  FINDINGS: There is no evidence of pelvic fracture or diastasis. No pelvic bone lesions are seen.  IMPRESSION: Negative.   Electronically Signed   By: Andreas Newport M.D.   On: 03/22/2015 21:33   Dg Chest Portable 1 View  03/22/2015   CLINICAL DATA:  Fall 5 feet from ladder.  Left shoulder pain.  EXAM: PORTABLE CHEST - 1 VIEW  COMPARISON:  04/21/2013  FINDINGS: Low lung volumes which accentuates heart size, likely within normal limits. No confluent airspace opacities, effusions or pneumothorax.  Findings suspicious for anterior left shoulder dislocation.  IMPRESSION: No acute cardiopulmonary disease.  Low lung volumes.  Probable left anterior shoulder dislocation.   Electronically Signed   By: Rolm Baptise M.D.   On: 03/22/2015 21:32   Dg Shoulder Left Port  03/22/2015   CLINICAL DATA:  64 year old male status post fall 5 feet from ladder  EXAM: LEFT SHOULDER - 1 VIEW  COMPARISON:  None.  FINDINGS: Positive for anterior subcoracoid dislocation the glenohumeral joint. No definite fracture on these views. Implanted loop recorder projects over the left chest. Inspiratory volumes are low.  IMPRESSION: Anterior subcoracoid shoulder dislocation.  No definite fracture   Electronically Signed   By: Jacqulynn Cadet M.D.   On: 03/22/2015 21:33   Dg Tibia/fibula Left Port  03/22/2015   CLINICAL DATA:  Fall 5 3 from ladder.  Left lower leg pain.  EXAM: PORTABLE LEFT TIBIA AND FIBULA - 2 VIEW  COMPARISON:  None.  FINDINGS: Comminuted fractures noted through the distal left tibia and fibula. Marked lateral displacement of the distal fracture fragments and foot relative to the tibia and fibula.  IMPRESSION: Comminuted distal tibial and fibular fractures with marked lateral displacement.   Electronically Signed   By: Rolm Baptise M.D.    On: 03/22/2015 21:34    ROS: ROS Limited mobility of left upper extremity Open left tib/fib fracture with hemostasis  Physical Exam: Alert and oriented 64 y/o male in no acute distress Cervical spine with full rom and no tenderness Left shoulder with obvious sulcus sign and anterior dislocation, suspect rotator cuff traumatic tear nv intact distally Right upper extremity with full rom no tenderness or deformity Pelvis stable Right lower extremity with full rom no tenderness or deformity Left lower extremity obvious distal tib/fib fracture medially that is open class III nv intact distally left foot with good pulses  Physical Exam   Assessment/Plan Assessment: 1. Open left tib/fib distal fractures 2. Left anterior shoulder dislocation  Plan: Admit for surgical management of left shoulder dislocation with closed reduction and I&D with external fixator for left lower extremity fracture.  Risks and benefits of surgery discussed with pt and plan to proceed urgently Pain management Keep NPO Strict non weight bearing left lower extremity

## 2015-03-22 NOTE — ED Notes (Signed)
Spoke with dr. Dorothyann Gibbs, OR is ready for patient. No reduction of shoulder while in ER at this time. Preparing for transport.

## 2015-03-23 ENCOUNTER — Inpatient Hospital Stay (HOSPITAL_COMMUNITY): Payer: BLUE CROSS/BLUE SHIELD

## 2015-03-23 ENCOUNTER — Encounter: Payer: Self-pay | Admitting: Internal Medicine

## 2015-03-23 ENCOUNTER — Encounter (HOSPITAL_COMMUNITY): Payer: Self-pay | Admitting: Orthopedic Surgery

## 2015-03-23 DIAGNOSIS — S82872C Displaced pilon fracture of left tibia, initial encounter for open fracture type IIIA, IIIB, or IIIC: Secondary | ICD-10-CM | POA: Diagnosis present

## 2015-03-23 LAB — BASIC METABOLIC PANEL
ANION GAP: 10 (ref 5–15)
BUN: 15 mg/dL (ref 6–20)
CO2: 26 mmol/L (ref 22–32)
CREATININE: 1.12 mg/dL (ref 0.61–1.24)
Calcium: 8.7 mg/dL — ABNORMAL LOW (ref 8.9–10.3)
Chloride: 105 mmol/L (ref 101–111)
GFR calc Af Amer: >60 mL/min (ref >60)
Glucose, Bld: 162 mg/dL — ABNORMAL HIGH (ref 65–99)
POTASSIUM: 4.1 mmol/L (ref 3.5–5.1)
Sodium: 141 mmol/L (ref 135–145)

## 2015-03-23 MED ORDER — METOCLOPRAMIDE HCL 5 MG PO TABS
5.0000 mg | ORAL_TABLET | Freq: Three times a day (TID) | ORAL | Status: DC | PRN
  Filled 2015-03-23: qty 2

## 2015-03-23 MED ORDER — ONDANSETRON HCL 4 MG/2ML IJ SOLN
4.0000 mg | Freq: Four times a day (QID) | INTRAMUSCULAR | Status: DC | PRN
  Administered 2015-03-23: 4 mg via INTRAVENOUS
  Filled 2015-03-23: qty 2

## 2015-03-23 MED ORDER — OXYCODONE HCL 5 MG PO TABS: 5.0000 mg | ORAL_TABLET | Freq: Once | ORAL | Status: DC | PRN

## 2015-03-23 MED ORDER — ACETAMINOPHEN 500 MG PO TABS
1000.0000 mg | ORAL_TABLET | Freq: Four times a day (QID) | ORAL | Status: DC
Start: 2015-03-23 — End: 2015-03-31
  Administered 2015-03-23 – 2015-03-29 (×20): 1000 mg via ORAL
  Administered 2015-03-29: 500 mg via ORAL
  Administered 2015-03-29 – 2015-03-31 (×6): 1000 mg via ORAL
  Filled 2015-03-23 (×31): qty 2

## 2015-03-23 MED ORDER — METOCLOPRAMIDE HCL 5 MG/ML IJ SOLN
5.0000 mg | Freq: Three times a day (TID) | INTRAMUSCULAR | Status: DC | PRN
  Administered 2015-03-23: 10 mg via INTRAVENOUS
  Filled 2015-03-23 (×2): qty 2

## 2015-03-23 MED ORDER — ENOXAPARIN SODIUM 40 MG/0.4ML ~~LOC~~ SOLN: 40.0000 mg | SUBCUTANEOUS | Status: DC

## 2015-03-23 MED ORDER — PREGABALIN 75 MG PO CAPS
75.0000 mg | ORAL_CAPSULE | Freq: Two times a day (BID) | ORAL | Status: DC
  Administered 2015-03-23 – 2015-03-27 (×9): 75 mg via ORAL
  Filled 2015-03-23 (×9): qty 1

## 2015-03-23 MED ORDER — ONDANSETRON HCL 4 MG PO TABS: 4.0000 mg | ORAL_TABLET | Freq: Four times a day (QID) | ORAL | Status: DC | PRN

## 2015-03-23 MED ORDER — HYDROMORPHONE HCL 1 MG/ML IJ SOLN: 0.2500 mg | INTRAMUSCULAR | Status: DC | PRN

## 2015-03-23 MED ORDER — OXYCODONE HCL 5 MG PO TABS
5.0000 mg | ORAL_TABLET | ORAL | Status: DC | PRN
  Administered 2015-03-23 (×3): 10 mg via ORAL
  Administered 2015-03-24 (×2): 5 mg via ORAL
  Filled 2015-03-23 (×3): qty 2

## 2015-03-23 MED ORDER — OXYCODONE HCL 5 MG/5ML PO SOLN: 5.0000 mg | Freq: Once | ORAL | Status: DC | PRN

## 2015-03-23 MED ORDER — LACTATED RINGERS IV SOLN
INTRAVENOUS | Status: DC
  Administered 2015-03-23 – 2015-03-24 (×4): via INTRAVENOUS

## 2015-03-23 MED ORDER — METHOCARBAMOL 1000 MG/10ML IJ SOLN
1000.0000 mg | Freq: Four times a day (QID) | INTRAVENOUS | Status: DC | PRN
  Filled 2015-03-23: qty 10

## 2015-03-23 MED ORDER — PROMETHAZINE HCL 25 MG/ML IJ SOLN: 6.2500 mg | INTRAMUSCULAR | Status: DC | PRN

## 2015-03-23 MED ORDER — SIMVASTATIN 10 MG PO TABS: 10.0000 mg | ORAL_TABLET | Freq: Every day | ORAL | Status: DC

## 2015-03-23 MED ORDER — HYDROCODONE-ACETAMINOPHEN 5-325 MG PO TABS
1.0000 | ORAL_TABLET | ORAL | Status: DC | PRN
  Administered 2015-03-23: 2 via ORAL
  Filled 2015-03-23: qty 2

## 2015-03-23 MED ORDER — KETOROLAC TROMETHAMINE 15 MG/ML IJ SOLN
7.5000 mg | Freq: Four times a day (QID) | INTRAMUSCULAR | Status: DC
  Administered 2015-03-23 (×2): 7.5 mg via INTRAVENOUS
  Filled 2015-03-23 (×2): qty 1

## 2015-03-23 MED ORDER — DOCUSATE SODIUM 100 MG PO CAPS
100.0000 mg | ORAL_CAPSULE | Freq: Two times a day (BID) | ORAL | Status: DC
  Administered 2015-03-23 – 2015-03-31 (×17): 100 mg via ORAL
  Filled 2015-03-23 (×18): qty 1

## 2015-03-23 MED ORDER — HYDROMORPHONE HCL 1 MG/ML IJ SOLN
0.5000 mg | INTRAMUSCULAR | Status: DC | PRN
  Administered 2015-03-23 – 2015-03-24 (×6): 0.5 mg via INTRAVENOUS
  Filled 2015-03-23 (×6): qty 1

## 2015-03-23 MED ORDER — PENICILLIN G POTASSIUM 5000000 UNITS IJ SOLR
2.0000 10*6.[IU] | Freq: Four times a day (QID) | INTRAVENOUS | Status: DC
  Administered 2015-03-23 – 2015-03-24 (×5): 2 10*6.[IU] via INTRAVENOUS
  Filled 2015-03-23 (×7): qty 2

## 2015-03-23 MED ORDER — SIMVASTATIN 10 MG PO TABS
10.0000 mg | ORAL_TABLET | Freq: Every day | ORAL | Status: DC
  Administered 2015-03-23 – 2015-03-30 (×8): 10 mg via ORAL
  Filled 2015-03-23 (×9): qty 1

## 2015-03-23 MED ORDER — CEFAZOLIN SODIUM-DEXTROSE 2-3 GM-% IV SOLR
2.0000 g | Freq: Four times a day (QID) | INTRAVENOUS | Status: DC
  Administered 2015-03-23 – 2015-03-24 (×6): 2 g via INTRAVENOUS
  Filled 2015-03-23 (×8): qty 50

## 2015-03-23 MED ORDER — SENNA 8.6 MG PO TABS
1.0000 | ORAL_TABLET | Freq: Two times a day (BID) | ORAL | Status: DC
  Administered 2015-03-23 – 2015-03-31 (×16): 8.6 mg via ORAL
  Filled 2015-03-23 (×18): qty 1

## 2015-03-23 MED ORDER — GENTAMICIN SULFATE 40 MG/ML IJ SOLN
7.0000 mg/kg | INTRAVENOUS | Status: DC
  Administered 2015-03-24: 530 mg via INTRAVENOUS
  Filled 2015-03-23 (×3): qty 13.25

## 2015-03-23 MED ORDER — HYDROMORPHONE HCL 1 MG/ML IJ SOLN
INTRAMUSCULAR | Status: AC
  Filled 2015-03-23: qty 1

## 2015-03-23 MED ORDER — GENTAMICIN IN SALINE 1.6-0.9 MG/ML-% IV SOLN
80.0000 mg | Freq: Three times a day (TID) | INTRAVENOUS | Status: DC
  Administered 2015-03-23 (×2): 80 mg via INTRAVENOUS
  Filled 2015-03-23 (×5): qty 50

## 2015-03-23 MED ORDER — ACETAMINOPHEN 650 MG RE SUPP: 650.0000 mg | Freq: Four times a day (QID) | RECTAL | Status: DC | PRN

## 2015-03-23 MED ORDER — ACETAMINOPHEN 325 MG PO TABS: 650.0000 mg | ORAL_TABLET | Freq: Four times a day (QID) | ORAL | Status: DC | PRN

## 2015-03-23 NOTE — Care Management Utilization Note (Signed)
Utilization review completed. Verlee Rossetti, RN BSN Case Manager

## 2015-03-23 NOTE — Progress Notes (Signed)
OT Cancellation Note  Patient Details Name: Justin [name] MRN: [id number] DOB: 02/27/1951   Cancelled Treatment:    Reason Eval/Treat Not Completed: Other (comment) (Plan for closed reduction in OR tomorrow). Will check back as able and appropriate for OT evaluation.   Momen Ham , MS, OTR/L, CLT Pager: [phone]  03/23/2015, 2:31 PM

## 2015-03-23 NOTE — Progress Notes (Signed)
Orthopaedic Trauma Service Consult Note  Requesting: Geralynn Rile, MD (ortho) Reason: Open L distal tib-fib fracture, L shoulder dislocation   PCP: Dr. Buelah Manis Cards: Dr. Caryl Comes  HPI  Very pleasant 64 year old right-hand-dominant white male on his barn yesterday evening changing some lites when he fell off of his ladder. Patient does not recall the exact way that he fell however he believes that his left leg got caught between the rungs and he fell on his left shoulder. Patient had immediate onset of pain. He attempted to get up but sought deformity at his left ankle. He crawled approximately 100 yards back to his house to call 911. He was brought to [place] for evaluation. He was found to have an open left distal tibia fracture as well as a left shoulder dislocation. Patient was seen and evaluated by Dr. Lyla Glassing in the emergency department and was taken urgently to the OR for closed reduction of his left shoulder as well as irrigation and debridement of his open left distal tibia fracture with application of a spanning external fixator. Given the complexity of the patient's injury it was felt that the patient would need the expertise of a fellowship trained orthopedic traumatologist for care of his injuries. As such we are consulted for management of patient's injuries.  Patient is seen on 5 N. complains only of left shoulder and left ankle pain. He does have some very mild paresthesias to the dorsum of his left foot. Soreness left shoulder. Denies any injuries to the right side of his body. Does not recall losing consciousness prior to during or after his fall. Denies any chest pain or palpitations. No abdominal pain.   Patient does have a implanted loop recorder which was placed after patient sustained a stroke approximately 2 years ago without an identifiable cause. Patient sees Dr. Caryl Comes for this.+ PFO on bubble study back in 2014. No cardiac issues since his stroke in 2014.    Patient has  been on Ancef, penicillin and gentamicin for his fracture since admission.   Case and management discussed with Dr. handy   Review of Systems  Constitutional: Negative for fever and chills.  Eyes: Negative for blurred vision.  Respiratory: Negative for shortness of breath and wheezing.   Cardiovascular: Negative for chest pain and palpitations.  Gastrointestinal: Negative for nausea, vomiting and abdominal pain.  Genitourinary: Negative for dysuria and urgency.  Musculoskeletal:       Left ankle and left shoulder pain  Neurological: Positive for tingling and sensory change. Negative for headaches.       Left foot    Past Medical History  Diagnosis Date  . Bell's palsy 1980's; [id number]; 2000's; 12/2012; 03/2013    "multiple times" (04/21/2013)  . TIA (transient ischemic attack) 04/21/2013  . Stroke   . Hypercholesteremia     Past Surgical History  Procedure Laterality Date  . Tee without cardioversion N/A 04/24/2013    Procedure: TRANSESOPHAGEAL ECHOCARDIOGRAM (TEE);  Surgeon: Thayer Headings, MD;  Location: [place];  Service: Cardiovascular;  Laterality: N/A;  . Implanted loop recorder    . Colonoscopy N/A 07/13/2013    Procedure: COLONOSCOPY;  Surgeon: Danie Binder, MD;  Location: [facility];  Service: Endoscopy;  Laterality: N/A;  8:30 AM  . Loop recorder implant N/A 04/24/2013    Procedure: LOOP RECORDER IMPLANT;  Surgeon: Deboraha Sprang, MD;  Location: [facility] CATH LAB;  Service: Cardiovascular;  Laterality: N/A;   Allergies No Known Allergies   Medications Prior to  Admission  Medication Sig Dispense Refill  . aspirin 325 MG tablet Take 325 mg by mouth daily.    . simvastatin (ZOCOR) 10 MG tablet Take 10 mg by mouth daily.    Marland Kitchen acyclovir (ZOVIRAX) 800 MG tablet Take 1 tablet (800 mg total) by mouth 4 (four) times daily. (Patient not taking: Reported on 03/22/2015) 20 tablet 1  . aspirin 325 MG tablet Take 1 tablet (325 mg total) by mouth daily. (Patient not taking: Reported  on 03/22/2015) 30 tablet 1  . predniSONE (DELTASONE) 10 MG tablet 6 tabs on day 1, 5 tabs on day 2, 4 tabs on day 3, 3 tabs on day 4, 2 tabs on day 5, and 1 tab on day 6. (Patient not taking: Reported on 03/22/2015) 21 tablet 0  . simvastatin (ZOCOR) 10 MG tablet Take 1 tablet (10 mg total) by mouth at bedtime. (Patient not taking: Reported on 03/22/2015) 90 tablet 3  . zoster vaccine live, PF, (ZOSTAVAX) 96283 UNT/0.65ML injection Inject 19,400 Units into the skin once. (Patient not taking: Reported on 02/21/2015) 1 each 0     Family History  Problem Relation Age of Onset  . Heart disease Mother   . Alcohol abuse Mother   . Colon cancer Neg Hx    History   Social History  . Marital Status: Married    Spouse Name: N/A  . Number of Children: 3  . Years of Education: college   Occupational History  . [employer]    Social History Main Topics  . Smoking status: Never Smoker   . Smokeless tobacco: Never Used  . Alcohol Use: Yes     Comment: 03/22/2013 "glass of wine couple times/yr"  . Drug Use: No  . Sexual Activity: Yes   Other Topics Concern  . Not on file   Social History Narrative      Objective   BP 126/89 mmHg  Pulse 95  Temp(Src) 97.7 F (36.5 C) (Oral)  Resp 20  Ht _0  (1.753 m)  Wt 83.915 kg (185 lb)  BMI 27.31 kg/m2  SpO2 97%  Intake/Output      06/07 0701 - 06/08 0700 06/08 0701 - 06/09 0700   I.V. (mL/kg) 1550 (18.5)    IV Piggyback 100    Total Intake(mL/kg) 1650 (19.7)    Urine (mL/kg/hr) 450    Stool 0    Blood 100    Total Output 550     Net +1100            Labs Results for CASIMER, RUSSETT (MRN [id number]) as of 03/23/2015 09:19  Ref. Range 03/23/2015 05:09  Sodium Latest Ref Range: 135-145 mmol/L 141  Potassium Latest Ref Range: 3.5-5.1 mmol/L 4.1  Chloride Latest Ref Range: 101-111 mmol/L 105  CO2 Latest Ref Range: 22-32 mmol/L 26  BUN Latest Ref Range: 6-20 mg/dL 15  Creatinine Latest Ref Range: 0.61-1.24 mg/dL 1.12  Calcium Latest Ref  Range: 8.9-10.3 mg/dL 8.7 (L)  EGFR (Non-African Amer.) Latest Ref Range: >60 mL/min >60  EGFR (African American) Latest Ref Range: >60 mL/min >60  Glucose Latest Ref Range: 65-99 mg/dL 162 (H)  Anion gap Latest Ref Range: 5-15  10   Results for CALHOUN, REICHARDT (MRN [id number]) as of 03/23/2015 09:19  Ref. Range 03/22/2015 20:36 03/22/2015 21:22 03/22/2015 23:00 03/23/2015 00:00 03/23/2015 05:09  Glucose Latest Ref Range: 65-99 mg/dL 171 (H)    162 (H)    Exam  Gen: Awake and alert, no acute distress,  resting comfortably in bed Lungs: Clear to auscultation bilaterally Cardiac: Regular rate and rhythm, S1 and S2 Abd: Soft, nontender, nondistended,+ bowel sounds Pelvis: no traumatic wounds or rash, no ecchymosis, stable to manual stress, nontender Ext:       Left upper extremity Inspection:   Patient in sling   No gross deformities noted in the shoulder, elbow, forearm, wrist or hand   No open wounds or lesions   No ecchymosis Bony eval:   Tender to palpation left shoulder    Clavicle, elbow, forearm, wrist and hand are nontender    no crepitus with evaluation Soft tissue:    Soft tissues unremarkable.    Soft tissue structures are on shoulder not stressed in terms of stability ROM:    Full elbow, forearm, wrist and hand motion are noted Sensation:    Radial, ulnar, median, axillary nerve sensory function intact Motor:    Radial, ulnar, median, axillary nerves motor functions intact Vascular:    Extremity is warm    Palpable radial pulse    No Significant swelling to the left upper extremity       Left lower extremity Inspection:   Patient in a delta frame external fixator   2 tibial half pins, one trans-calcaneal pin   Delta frame is augmented with a bar between the 2 peripheral bars   Dressing to the left lower leg is stable some mild oozing noted   Hip and knee are unremarkable Bony eval:    Hip and knee are nontender, no crepitus with evaluation    Distal tibia and fibular  are tender Soft tissue:    Soft Tissue around the knee and hip are unremarkable     I did not remove the dressing to evaluate his traumatic wounds as the patient was just in the OR several hours ago ROM:    Unable to assess ankle range of motion as the patient is in a spanning external fixator Sensation:    DPN, SPN and TN sensory functions are grossly intact. Some slight decrease along the DPN distribution Motor:    EHL, FHL and lesser toe motor functions are intact Vascular:   Extremity is warm   Palpable dorsalis pedis pulse   No significant swelling noted at this time   Compartments are soft and nontender, no pain out of proportion with passive stretching       Right upper extremity UEx shoulder, elbow, wrist, digits- no skin wounds, nontender, no instability, no blocks to motion  Sens  Ax/R/M/U intact  Mot   Ax/ R/ PIN/ M/ AIN/ U intact  Rad 2+       Right lower extremity            No traumatic wounds, ecchymosis, or rash  Nontender  No effusions  Knee stable to varus/ valgus and anterior/posterior stress   Hip is stable, no pain with axial loading or logrolling  Ankle is unremarkable as well. Ankle is stable with ligamentous stressing. Full range of motion is noted  Sens DPN, SPN, TN intact  Motor EHL, ext, flex, evers 5/5  DP 2+, No swelling/edema    Assessment and Plan   POD/HD#: 1  1. Grade 3 open left distal tib-fib fracture status post I&D and external fixation, closed left anterior shoulder dislocation status post reduction   Return to the OR tomorrow for repeat irrigation and debridement open left tibia injury and external fixator revision, may consider placing a flexible nail and fibula tomorrow  CT scan  pending left ankle  Would anticipate delayed fixation about 10-14 days given the magnitude of wound  Also due to the size of his wound we did discuss the possibility of early evaluation by plastic surgery given the tenuous nature of medial sided soft  tissue.  Injury films show that there is very little articular block which may make plate fixation somewhat difficult however we will again review the CT scan. It may be that patient is treated definitively in his ex-fix  Patient does remain at risk for complications such as nonunion and deep infection given the open nature of his injury.  Patient will remain on IV antibodies for now. He will remain on them until we definitively close his wound for 72 hours post definitive closure   Patient does not smoke and does not report a history of diabetes which is a favorable history and the presence of this open injury.  On review of his labs his glucose has been somewhat elevated in the 170s and 160s. Will check a hemoglobin A1c to ensure that he is not an undiagnosed diabetic. This may simply be a stress reaction due to his trauma   With respect to his left upper extremity we will proceed with MRI at this time to evaluate for any ligamentous or other structural injury. It would be optimal for the patient to be able to use his arm to weight-bear through given his left lower extremity injury.  Continue with sling for now   Ice and elevation to extremities  Elevate left leg above heart for swelling/edema control  2. Pain management:  Continue with current management  3. ABL anemia/Hemodynamics  CBC in a.m.  4. Medical issues   Stable  + PFO on bubble study back in 2014. No cardiac issues since his stroke in 2014. No identifiable cause of the stroke noted   5. DVT/PE prophylaxis:  We'll place on Lovenox after next surgery  6. ID:   Continue with Ancef, gentamicin and penicillin until further notice  7. Metabolic Bone Disease:  Check vitamin D levels  8. Activity:  Activity as tolerated  Non-Weightbearing left upper extremity and left lower extremity  Okay to transfer from bed to chair with assist  9. FEN/Foley/Lines:  Advance to regular diet  Npo after midnight  10.Ex-fix/Splint  care:  Okay to manipulate left leg by fixator  11. Dispo:  Return to the OR tomorrow for repeat irrigation and debridement of left distal tibia and fibula fractures    [name], PA-C [facility] [phone] (O) 03/23/2015 9:12 AM

## 2015-03-23 NOTE — Progress Notes (Signed)
ANTIBIOTIC CONSULT NOTE - INITIAL  Pharmacy Consult for Gentamicin Indication: Prophylaxis with stage III open fracture  No Known Allergies  Patient Measurements: Height: 5\' 9"  (175.3 cm) Weight: 185 lb (83.915 kg) IBW/kg (Calculated) : 70.7   Vital Signs: Temp: 98.8 F (37.1 C) (06/08 1400) BP: 129/84 mmHg (06/08 1400) Pulse Rate: 102 (06/08 1400) Intake/Output from previous day: 06/07 0701 - 06/08 0700 In: 1650 [I.V.:1550; IV Piggyback:100] Out: 550 [Urine:450; Blood:100] Intake/Output from this shift:    Labs:  Recent Labs  03/22/15 2036 03/23/15 0509  WBC 13.7*  --   HGB 14.6  --   PLT 225  --   CREATININE 1.20 1.12   Estimated Creatinine Clearance: 68.4 mL/min (by C-G formula based on Cr of 1.12). No results for input(s): VANCOTROUGH, VANCOPEAK, VANCORANDOM, GENTTROUGH, GENTPEAK, GENTRANDOM, TOBRATROUGH, TOBRAPEAK, TOBRARND, AMIKACINPEAK, AMIKACINTROU, AMIKACIN in the last 72 hours.   Microbiology: No results found for this or any previous visit (from the past 720 hour(s)).  Medical History: Past Medical History  Diagnosis Date  . Bell's palsy 1980's; [id number]; 2000's; 12/2012; 03/2013    "multiple times" (04/21/2013)  . TIA (transient ischemic attack) 04/21/2013  . Stroke   . Hypercholesteremia     Assessment: 38 YOM who presented on 6/7 after falling from a ladder and was noted to have a stage III open L-pilon fracture and L-glenohumeral dislocation. Post I&D - Ancef + Penicillin G + Gentamicin were ordered per MD for post-op prophylaxis. Pharmacy has now been consulted to manage Gentamicin and adjust to once a day dosing.   Ortho plans to continue antibiotics for 72 hours after wound closure. Noted plans to go back to the OR tomorrow for I&D and possible placement of flexible nail and fibula.   Goal of Therapy:  Gent peak 6-8 mcg/mL Gent trough <2 mcg/mL  Plan:  1. Adjust Gentamicin to 530 mg IV once daily (~7 mg/kg * AdjBW) 2. Will obtain a ~10 hour  Gentamicin random level to further guide additional doses 3. Will f/u LOT plans with ortho once open fracture site has been closed.   [name], PharmD, BCPS Clinical Pharmacist Pager: [phone] 03/23/2015 4:32 PM

## 2015-03-23 NOTE — Progress Notes (Signed)
Orthopaedic Trauma Service Addendum  Ordered plain films of L shoulder this am to confirm reduction.  Intra-op xrays showed reduction. However plain films this am show persistent dislocation  Pt remains comfortable Plan for closed reduction in OR tomorrow MRI after repeat reduction   [name], PA-C [facility] [phone] (P) 03/23/2015 2:13 PM

## 2015-03-23 NOTE — Op Note (Signed)
Justin [name], [name] NO.:  [id number]  MEDICAL RECORD NO.:  [id number]  LOCATION:  5N06C                        FACILITY:  [facility]  PHYSICIAN:  Rod Can, MD     DATE OF BIRTH:  27-Jul-1951  DATE OF PROCEDURE:  03/22/2015 DATE OF DISCHARGE:                              OPERATIVE REPORT   SURGEON:  Rod Can, MD  ASSISTANT:  Merla Riches, P.A.  PREOPERATIVE DIAGNOSES: 1. Left glenohumeral dislocation. 2. Grade IIIA open left pilon fracture.  POSTOPERATIVE DIAGNOSES: 1. Left glenohumeral dislocation. 2. Grade IIIA open left pilon fracture.  PROCEDURES PERFORMED: 1. Closed reduction of left shoulder dislocation. 2. Debridement of skin, subcutaneous tissue, muscle, and bone, left     open pilon fracture. 3. Placement of spanning left lower extremity external fixator. 4. Closure of left lower extremity traumatic laceration, 11 cm in     length.  ANESTHESIA:  General.  COMPLICATIONS:  None.  ESTIMATED BLOOD LOSS:  100 mL.  ANTIBIOTICS:  Ancef 2 g.  DISPOSITION:  Stable to PACU.  SPECIMENS:  None.  INDICATIONS:  The patient is a 64 year old male who was working on his barn.  He was on a ladder, and he fell about 8 feet off the ladder, injuring his left shoulder and his left ankle.  He sustained an open fracture to the left lower leg.  He crawled from the barn to his house through a field.  He was brought to the emergency department.  Trauma workup was obtained.  He was found to have the above-mentioned injuries and we discussed urgent trip to the operating room tonight for closed reduction of the shoulder and for debridement and closure of the wound with placement of spanning fixator.  They understand that he is going to need definitive surgery in the future.  Risks, benefits, and alternatives were explained.  They elected to proceed.  DESCRIPTION OF PROCEDURE IN DETAIL:  The patient was correctly identified in the holding room.  Using 2  identifiers, I marked the surgical site.  The patient was taken to the operating room and anesthesia was induced on the bed.  Time-out was performed, verifying site and site of surgery.  I began by performing a closed reduction of the shoulder which reduced easily.  I obtained fluoroscopic AP and axillary views which showed that the shoulder was reduced.  The patient was then transferred to the [facility] flat top after the sling was placed on the left arm.  The left lower extremity was then prepped and draped in normal sterile surgical fashion.  I examined his left lower extremity.  He did have an 11 cm wound over the distal/medial aspect of the lower tibia.  The distal pilon fragments in his foot were dislocated laterally.  There was extensive periosteal stripping and there was extensive contamination of the denim material in the wound as well as Dirt and grass.  I used a rongeur to sharply debride all debris from the contaminated bone and from the contaminated muscle and fatty tissue.  He had lacerated and thrombosed his saphenous vein which I trimmed back with scissors. After I was happy that I debrided all dirt in devitalized tissue, I then copiously  irrigated the wound with 9 L of normal saline with pulse lavage.  His posterior tib neurovascular bundle was intact.  I then reduced the fracture and the wound was closed with interrupted 2-0 nylon sutures.  Again, the laceration total of 11 cm in length.  I then turned my attention to placing the fixator.  I placed a calcaneal transfixing pin from medial to lateral to avoid the neurovascular bundle.  I placed 2 pins in the proximal tibia.  I assembled the delta frame and traction was pulled and everything was tightened in place.  I checked AP and lateral fluoroscopy views to check that we had restored his overall length and alignment.  I then placed a bar-to-bar connector to stiffen the construct.  Sterile dressings were applied.  The  patient was extubated and taken to the PACU in stable condition.  Sponge, needle, and instrument counts were correct at the end of the case x2.  There were no known complications.  He did have well-perfused foot with palpable DP pulse at the end of the case.  I discussed the operative events and findings with the patient's wife. We will admit the patient to the hospital.  He will receive 72 hours of IV Ancef, penicillin, and gentamicin given the contaminated grade 3 fracture.  We will put him on Lovenox for DVT prophylaxis.  He will wear a sling to the left upper extremity.  I told them that at his age, shoulder dislocations are often associated with rotator cuff tears and this can be pursued in the future.  He will be nonweightbearing to the left upper and left lower extremities.  He will have a CT scan of the left ankle.  We will determine definitive management of his injuries.  All questions were solicited and answered to their satisfaction.          ______________________________ Rod Can, MD     BS/MEDQ  D:  03/23/2015  T:  03/23/2015  Job:  [id number]

## 2015-03-23 NOTE — Progress Notes (Signed)
   Subjective:  Patient reports pain as mild to moderate.  Denies N/V/CP/SOB.  Objective:   VITALS:   Filed Vitals:   03/23/15 0045 03/23/15 0100 03/23/15 0115 03/23/15 0400  BP: 162/104 154/110 154/90 126/89  Pulse: 93 109 97 95  Temp:    97.7 F (36.5 C)  TempSrc:    Oral  Resp: 13 18 17 20   Height:      Weight:      SpO2: 100% 99% 99% 97%     RUE/RLE: No trauma or TTP. Full ROM. LUE: in sling. 2+ radial pulse. SILT Ax/MC/R/U/M. Motor + AIN/PIN/U. LLE: ex fix intact. 2+ DP. + EHL (Can't DF/PF due to exfix). SILT.  Lab Results  Component Value Date   WBC 13.7* 03/22/2015   HGB 14.6 03/22/2015   HCT 43.8 03/22/2015   MCV 85.7 03/22/2015   PLT 225 03/22/2015   BMET    Component Value Date/Time   NA 141 03/22/2015 2036   K 3.9 03/22/2015 2036   CL 111 03/22/2015 2036   CO2 20* 03/22/2015 2036   GLUCOSE 171* 03/22/2015 2036   BUN 18 03/22/2015 2036   CREATININE 1.20 03/22/2015 2036   CREATININE 0.96 11/22/2014 0800   CALCIUM 9.4 03/22/2015 2036   GFRNONAA >60 03/22/2015 2036   GFRNONAA 85 11/22/2014 0800   GFRAA >60 03/22/2015 2036   GFRAA >89 11/22/2014 0800     Assessment/Plan: 1 Day Post-Op   Active Problems:   Open fracture of left tibia and fibula   Open displaced pilon fracture of left tibia, type III L shoulder dislocation   NWB LUE, LLE Sling LUE IV ancef, gent, PCN for grade III open pilon fx DVT ppx: lovenox Needs CT L ankle today Discussed patient with Dr. Marcelino Scot who will assume care later today   Makaylia Hewett, Horald Pollen 03/23/2015, 7:23 AM   Rod Can, MD Cell [phone]

## 2015-03-23 NOTE — Transfer of Care (Signed)
Immediate Anesthesia Transfer of Care Note  Patient: Justin [name]  Procedure(s) Performed: Procedure(s): CLOSED REDUCTION SHOULDER (Left) EXTERNAL FIXATION left ankle (Left) IRRIGATION AND DEBRIDEMENT OPEN TIBIA -FIBULA  FRACTURE (Left)  Patient Location: PACU  Anesthesia Type:General  Level of Consciousness: awake, alert  and oriented  Airway & Oxygen Therapy: Patient connected to nasal cannula oxygen  Post-op Assessment: Report given to RN, Post -op Vital signs reviewed and stable and Patient moving all extremities X 4  Post vital signs: Reviewed and stable  Last Vitals:  Filed Vitals:   03/23/15 0021  BP: 143/89  Pulse: 94  Temp:   Resp: 12    Complications: No apparent anesthesia complications

## 2015-03-23 NOTE — Evaluation (Signed)
Physical Therapy Evaluation Patient Details Name: Justin [name] MRN: [id number] DOB: 1951-02-25 Today's Date: 03/23/2015   History of Present Illness  64 yo male with fall from ladder resulting in LLE open pilon fracture and left glenohumeral dislocation, s/p Placement of spanning left lower extremity external fixator.  Clinical Impression  Pt admitted with the above complications. Pt currently with functional limitations due to the deficits listed below (see PT Problem List). Very motivated to improve. Practiced squat pivot transfer with mod assist for stability. Previously independent PTA, lives with his wife, and may be a good candidate for CIR to progress his functional independence and safety with mobility prior to returning home. Pt will benefit from skilled PT to increase their independence and safety with mobility to allow discharge to the venue listed below.       Follow Up Recommendations CIR    Equipment Recommendations   (Pending progress)    Recommendations for Other Services Rehab consult;OT consult     Precautions / Restrictions Precautions Precautions: Fall;Shoulder Type of Shoulder Precautions: dislocated Shoulder Interventions: Shoulder sling/immobilizer Required Braces or Orthoses: Other Brace/Splint (external fixator LLE) Restrictions Weight Bearing Restrictions: Yes LUE Weight Bearing: Non weight bearing LLE Weight Bearing: Non weight bearing      Mobility  Bed Mobility Overal bed mobility: Needs Assistance Bed Mobility: Supine to Sit     Supine to sit: Min assist;HOB elevated     General bed mobility comments: min assist for LLE support and for patient to pull through RUE. HOB elevated able to scoot to edge of bed  Transfers Overall transfer level: Needs assistance Equipment used: None Transfers: Squat Pivot Transfers     Squat pivot transfers: Mod assist;+2 physical assistance     General transfer comment: Demonstrated squat pivot transfer  towards patient's unaffected side prior to practicing. Mod assist for balance upon pivot, almost standing fully upright. Second person to support LLE. Transfer towards right with hand in hand support. Cues for technique throughout  Ambulation/Gait                Stairs            Wheelchair Mobility    Modified Rankin (Stroke Patients Only)       Balance Overall balance assessment: Needs assistance Sitting-balance support: No upper extremity supported;Feet unsupported Sitting balance-Leahy Scale: Good                                       Pertinent Vitals/Pain Pain Assessment: 0-10 Pain Score: 7  Pain Location: LUE - shoulder Pain Descriptors / Indicators: Aching Pain Intervention(s): Monitored during session;Repositioned    Home Living Family/patient expects to be discharged to:: Private residence Living Arrangements: Spouse/significant other Available Help at Discharge: Family;Available 24 hours/day Type of Home: Apartment Home Access: Stairs to enter Entrance Stairs-Rails: Right Entrance Stairs-Number of Steps: 2 Home Layout: One level Home Equipment: Shower seat - built in      Prior Function Level of Independence: Independent               Hand Dominance   Dominant Hand: Right    Extremity/Trunk Assessment   Upper Extremity Assessment: Defer to OT evaluation (in sling)           Lower Extremity Assessment: LLE deficits/detail   LLE Deficits / Details: limited assessment due to external fixator. good volitional movement of first 2 digits. Minimal movement  in lateral 3 digits. some numbness reported.     Communication   Communication: No difficulties  Cognition Arousal/Alertness: Awake/alert Behavior During Therapy: WFL for tasks assessed/performed Overall Cognitive Status: Within Functional Limits for tasks assessed                      General Comments General comments (skin integrity, edema, etc.):  moderate drainage from LLE wound. Ice applied to Lt shoulder    Exercises General Exercises - Lower Extremity Ankle Circles/Pumps: AROM;Right;10 reps;Seated      Assessment/Plan    PT Assessment Patient needs continued PT services  PT Diagnosis Difficulty walking;Acute pain   PT Problem List Decreased strength;Decreased range of motion;Decreased activity tolerance;Decreased balance;Decreased mobility;Decreased knowledge of use of DME;Pain;Impaired sensation  PT Treatment Interventions DME instruction;Gait training;Functional mobility training;Therapeutic exercise;Therapeutic activities;Balance training;Neuromuscular re-education;Patient/family education;Modalities;Wheelchair mobility training   PT Goals (Current goals can be found in the Care Plan section) Acute Rehab PT Goals Patient Stated Goal: Get better PT Goal Formulation: With patient Time For Goal Achievement: 04/06/15 Potential to Achieve Goals: Good    Frequency Min 5X/week   Barriers to discharge Inaccessible home environment stairs to enter home    Co-evaluation               End of Session Equipment Utilized During Treatment: Gait belt Activity Tolerance: Patient tolerated treatment well Patient left: in chair;with call bell/phone within reach;with family/visitor present Nurse Communication: Mobility status;Weight bearing status;Precautions         Time: 1425-1449 PT Time Calculation (min) (ACUTE ONLY): 24 min   Charges:   PT Evaluation $Initial PT Evaluation Tier I: 1 Procedure PT Treatments $Therapeutic Activity: 8-22 mins   PT G CodesEllouise Newer 03/23/2015, 3:36 PM Camille Bal [place], [place]

## 2015-03-23 NOTE — Progress Notes (Signed)
Rehab Admissions Coordinator Note:  Patient was screened by Cleatrice Burke for appropriateness for an Inpatient Acute Rehab Consult per PT recommendation.   At this time, we are recommending Inpatient Rehab consult. Please place order if you would like [place] to assess pt for admission. [employer] would have to approve any rehab venue option.  Cleatrice Burke 03/23/2015, 9:03 PM  I can be reached at [phone].

## 2015-03-24 ENCOUNTER — Encounter (HOSPITAL_COMMUNITY)
Admission: EM | Disposition: A | Discharge status: Rehabilitation Facility | Payer: Self-pay | Source: Home / Self Care | Attending: Orthopedic Surgery

## 2015-03-24 ENCOUNTER — Inpatient Hospital Stay (HOSPITAL_COMMUNITY): Payer: BLUE CROSS/BLUE SHIELD

## 2015-03-24 ENCOUNTER — Inpatient Hospital Stay (HOSPITAL_COMMUNITY): Payer: BLUE CROSS/BLUE SHIELD | Admitting: Anesthesiology

## 2015-03-24 ENCOUNTER — Encounter (HOSPITAL_COMMUNITY): Payer: Self-pay | Admitting: Anesthesiology

## 2015-03-24 HISTORY — PX: EXTERNAL FIXATION LEG: SHX1549

## 2015-03-24 LAB — CBC
HCT: 33.9 % — ABNORMAL LOW (ref 39.0–52.0)
Hemoglobin: 11.2 g/dL — ABNORMAL LOW (ref 13.0–17.0)
MCH: 29.1 pg (ref 26.0–34.0)
MCHC: 33 g/dL (ref 30.0–36.0)
MCV: 88.1 fL (ref 78.0–100.0)
Platelets: 190 10*3/uL (ref 150–400)
RBC: 3.85 MIL/uL — ABNORMAL LOW (ref 4.22–5.81)
RDW: 14.1 % (ref 11.5–15.5)
WBC: 13.9 10*3/uL — ABNORMAL HIGH (ref 4.0–10.5)

## 2015-03-24 LAB — COMPREHENSIVE METABOLIC PANEL
ALT: 18 U/L (ref 17–63)
ANION GAP: 8 (ref 5–15)
AST: 27 U/L (ref 15–41)
Albumin: 3.1 g/dL — ABNORMAL LOW (ref 3.5–5.0)
Alkaline Phosphatase: 49 U/L (ref 38–126)
BUN: 12 mg/dL (ref 6–20)
CALCIUM: 8.1 mg/dL — AB (ref 8.9–10.3)
CO2: 25 mmol/L (ref 22–32)
CREATININE: 1.09 mg/dL (ref 0.61–1.24)
Chloride: 102 mmol/L (ref 101–111)
GFR calc non Af Amer: >60 mL/min (ref >60)
GLUCOSE: 135 mg/dL — AB (ref 65–99)
POTASSIUM: 4.1 mmol/L (ref 3.5–5.1)
Sodium: 135 mmol/L (ref 135–145)
Total Bilirubin: 0.7 mg/dL (ref 0.3–1.2)
Total Protein: 5.3 g/dL — ABNORMAL LOW (ref 6.5–8.1)

## 2015-03-24 LAB — GENTAMICIN LEVEL, RANDOM: GENTAMICIN RM: 1.2 ug/mL

## 2015-03-24 LAB — PROTIME-INR
INR: 1.25 (ref 0.00–1.49)
Prothrombin Time: 15.8 seconds — ABNORMAL HIGH (ref 11.6–15.2)

## 2015-03-24 LAB — HEMOGLOBIN A1C
HEMOGLOBIN A1C: 5.9 % — AB (ref 4.8–5.6)
Mean Plasma Glucose: 123 mg/dL

## 2015-03-24 LAB — APTT: aPTT: 32 seconds (ref 24–37)

## 2015-03-24 SURGERY — EXTERNAL FIXATION, LOWER EXTREMITY
Anesthesia: General | Site: Ankle | Laterality: Left

## 2015-03-24 MED ORDER — ALBUTEROL SULFATE HFA 108 (90 BASE) MCG/ACT IN AERS
INHALATION_SPRAY | RESPIRATORY_TRACT | Status: DC | PRN
  Administered 2015-03-24: 4 via RESPIRATORY_TRACT

## 2015-03-24 MED ORDER — HYDROMORPHONE HCL 1 MG/ML IJ SOLN
0.5000 mg | INTRAMUSCULAR | Status: DC | PRN
  Administered 2015-03-25: 1 mg via INTRAVENOUS
  Filled 2015-03-24: qty 1

## 2015-03-24 MED ORDER — ENOXAPARIN SODIUM 40 MG/0.4ML ~~LOC~~ SOLN
40.0000 mg | SUBCUTANEOUS | Status: DC
  Administered 2015-03-24 – 2015-03-31 (×7): 40 mg via SUBCUTANEOUS
  Filled 2015-03-24 (×8): qty 0.4

## 2015-03-24 MED ORDER — PROMETHAZINE HCL 25 MG/ML IJ SOLN: 6.2500 mg | INTRAMUSCULAR | Status: DC | PRN

## 2015-03-24 MED ORDER — ONDANSETRON HCL 4 MG/2ML IJ SOLN
INTRAMUSCULAR | Status: AC
  Filled 2015-03-24: qty 2

## 2015-03-24 MED ORDER — PHENYLEPHRINE HCL 10 MG/ML IJ SOLN
INTRAMUSCULAR | Status: AC
  Filled 2015-03-24: qty 2

## 2015-03-24 MED ORDER — GLYCOPYRROLATE 0.2 MG/ML IJ SOLN
INTRAMUSCULAR | Status: DC | PRN
Start: 2015-03-24 — End: 2015-03-24
  Administered 2015-03-24: 0.4 mg via INTRAVENOUS

## 2015-03-24 MED ORDER — MIDAZOLAM HCL 5 MG/5ML IJ SOLN
INTRAMUSCULAR | Status: DC | PRN
  Administered 2015-03-24: 2 mg via INTRAVENOUS

## 2015-03-24 MED ORDER — CEFAZOLIN SODIUM-DEXTROSE 2-3 GM-% IV SOLR
2.0000 g | Freq: Four times a day (QID) | INTRAVENOUS | Status: AC
  Administered 2015-03-24 – 2015-03-25 (×5): 2 g via INTRAVENOUS
  Filled 2015-03-24 (×6): qty 50

## 2015-03-24 MED ORDER — ONDANSETRON HCL 4 MG/2ML IJ SOLN
INTRAMUSCULAR | Status: DC | PRN
  Administered 2015-03-24: 4 mg via INTRAVENOUS

## 2015-03-24 MED ORDER — OXYCODONE HCL 5 MG PO TABS
5.0000 mg | ORAL_TABLET | ORAL | Status: DC | PRN
  Administered 2015-03-24: 10 mg via ORAL
  Administered 2015-03-29: 5 mg via ORAL
  Administered 2015-03-29: 10 mg via ORAL
  Administered 2015-03-29: 5 mg via ORAL
  Administered 2015-03-30 – 2015-03-31 (×4): 10 mg via ORAL
  Filled 2015-03-24: qty 1
  Filled 2015-03-24 (×2): qty 2
  Filled 2015-03-24: qty 1
  Filled 2015-03-24 (×4): qty 2

## 2015-03-24 MED ORDER — LORAZEPAM 0.5 MG PO TABS
0.5000 mg | ORAL_TABLET | Freq: Once | ORAL | Status: AC
  Administered 2015-03-24: 0.5 mg via ORAL
  Filled 2015-03-24 (×2): qty 1

## 2015-03-24 MED ORDER — EPHEDRINE SULFATE 50 MG/ML IJ SOLN
INTRAMUSCULAR | Status: AC
  Filled 2015-03-24: qty 1

## 2015-03-24 MED ORDER — HYDROMORPHONE HCL 1 MG/ML IJ SOLN
INTRAMUSCULAR | Status: AC
  Filled 2015-03-24: qty 1

## 2015-03-24 MED ORDER — OXYCODONE HCL 5 MG PO TABS
ORAL_TABLET | ORAL | Status: AC
  Filled 2015-03-24: qty 1

## 2015-03-24 MED ORDER — PHENYLEPHRINE 40 MCG/ML (10ML) SYRINGE FOR IV PUSH (FOR BLOOD PRESSURE SUPPORT)
PREFILLED_SYRINGE | INTRAVENOUS | Status: AC
  Filled 2015-03-24: qty 10

## 2015-03-24 MED ORDER — GLYCOPYRROLATE 0.2 MG/ML IJ SOLN
INTRAMUSCULAR | Status: AC
Start: 2015-03-24 — End: 2015-03-24
  Filled 2015-03-24: qty 2

## 2015-03-24 MED ORDER — SODIUM CHLORIDE 0.9 % IJ SOLN
INTRAMUSCULAR | Status: AC
  Filled 2015-03-24: qty 10

## 2015-03-24 MED ORDER — SODIUM CHLORIDE 0.9 % IV SOLN
10.0000 mg | INTRAVENOUS | Status: DC | PRN
  Administered 2015-03-24: 50 ug/min via INTRAVENOUS

## 2015-03-24 MED ORDER — FENTANYL CITRATE (PF) 100 MCG/2ML IJ SOLN
INTRAMUSCULAR | Status: DC | PRN
  Administered 2015-03-24: 50 ug via INTRAVENOUS
  Administered 2015-03-24: 150 ug via INTRAVENOUS

## 2015-03-24 MED ORDER — MIDAZOLAM HCL 2 MG/2ML IJ SOLN: 0.5000 mg | Freq: Once | INTRAMUSCULAR | Status: DC | PRN

## 2015-03-24 MED ORDER — GENTAMICIN SULFATE 40 MG/ML IJ SOLN
7.0000 mg/kg | INTRAVENOUS | Status: AC
  Administered 2015-03-25 – 2015-03-27 (×3): 530 mg via INTRAVENOUS
  Filled 2015-03-24 (×3): qty 13.25

## 2015-03-24 MED ORDER — EPHEDRINE SULFATE 50 MG/ML IJ SOLN
INTRAMUSCULAR | Status: DC | PRN
  Administered 2015-03-24 (×4): 10 mg via INTRAVENOUS

## 2015-03-24 MED ORDER — PENICILLIN G POTASSIUM 5000000 UNITS IJ SOLR
2.0000 10*6.[IU] | Freq: Four times a day (QID) | INTRAMUSCULAR | Status: AC
  Administered 2015-03-24 – 2015-03-27 (×11): 2 10*6.[IU] via INTRAVENOUS
  Filled 2015-03-24 (×13): qty 2

## 2015-03-24 MED ORDER — NEOSTIGMINE METHYLSULFATE 10 MG/10ML IV SOLN
INTRAVENOUS | Status: AC
  Filled 2015-03-24: qty 1

## 2015-03-24 MED ORDER — PROPOFOL 10 MG/ML IV BOLUS
INTRAVENOUS | Status: AC
  Filled 2015-03-24: qty 20

## 2015-03-24 MED ORDER — SUCCINYLCHOLINE CHLORIDE 20 MG/ML IJ SOLN
INTRAMUSCULAR | Status: AC
  Filled 2015-03-24: qty 1

## 2015-03-24 MED ORDER — PHENYLEPHRINE HCL 10 MG/ML IJ SOLN
INTRAMUSCULAR | Status: DC | PRN
  Administered 2015-03-24: 40 ug via INTRAVENOUS
  Administered 2015-03-24 (×3): 120 ug via INTRAVENOUS

## 2015-03-24 MED ORDER — MIDAZOLAM HCL 2 MG/2ML IJ SOLN
INTRAMUSCULAR | Status: AC
  Filled 2015-03-24: qty 2

## 2015-03-24 MED ORDER — ROCURONIUM BROMIDE 50 MG/5ML IV SOLN
INTRAVENOUS | Status: AC
  Filled 2015-03-24: qty 1

## 2015-03-24 MED ORDER — HYDROMORPHONE HCL 1 MG/ML IJ SOLN
0.2500 mg | INTRAMUSCULAR | Status: DC | PRN
  Administered 2015-03-24 (×4): 0.5 mg via INTRAVENOUS

## 2015-03-24 MED ORDER — CEFAZOLIN SODIUM-DEXTROSE 2-3 GM-% IV SOLR
INTRAVENOUS | Status: AC
  Filled 2015-03-24: qty 50

## 2015-03-24 MED ORDER — FENTANYL CITRATE (PF) 250 MCG/5ML IJ SOLN
INTRAMUSCULAR | Status: AC
  Filled 2015-03-24: qty 5

## 2015-03-24 MED ORDER — ALBUMIN HUMAN 5 % IV SOLN
INTRAVENOUS | Status: DC | PRN
  Administered 2015-03-24: 08:00:00 via INTRAVENOUS

## 2015-03-24 MED ORDER — MEPERIDINE HCL 25 MG/ML IJ SOLN: 6.2500 mg | INTRAMUSCULAR | Status: DC | PRN

## 2015-03-24 MED ORDER — NEOSTIGMINE METHYLSULFATE 10 MG/10ML IV SOLN
INTRAVENOUS | Status: DC | PRN
  Administered 2015-03-24: 3 mg via INTRAVENOUS

## 2015-03-24 MED ORDER — PROPOFOL 10 MG/ML IV BOLUS
INTRAVENOUS | Status: DC | PRN
  Administered 2015-03-24: 20 mg via INTRAVENOUS
  Administered 2015-03-24: 40 mg via INTRAVENOUS

## 2015-03-24 MED ORDER — LIDOCAINE HCL 2 % EX GEL
CUTANEOUS | Status: AC
  Filled 2015-03-24: qty 20

## 2015-03-24 MED ORDER — LIDOCAINE HCL (CARDIAC) 20 MG/ML IV SOLN
INTRAVENOUS | Status: AC
  Filled 2015-03-24: qty 5

## 2015-03-24 MED ORDER — LIDOCAINE HCL (CARDIAC) 20 MG/ML IV SOLN
INTRAVENOUS | Status: DC | PRN
  Administered 2015-03-24: 40 mg via INTRAVENOUS

## 2015-03-24 MED ORDER — ROCURONIUM BROMIDE 100 MG/10ML IV SOLN
INTRAVENOUS | Status: DC | PRN
  Administered 2015-03-24: 40 mg via INTRAVENOUS
  Administered 2015-03-24: 10 mg via INTRAVENOUS

## 2015-03-24 MED ORDER — SODIUM CHLORIDE 0.9 % IR SOLN
Status: DC | PRN
  Administered 2015-03-24: 3000 mL
  Administered 2015-03-24: 1000 mL
  Administered 2015-03-24: 3000 mL

## 2015-03-24 SURGICAL SUPPLY — 88 items
BANDAGE ELASTIC 3 VELCRO ST LF (GAUZE/BANDAGES/DRESSINGS) ×2 IMPLANT
BANDAGE ELASTIC 4 VELCRO ST LF (GAUZE/BANDAGES/DRESSINGS) ×1 IMPLANT
BANDAGE ELASTIC 6 VELCRO ST LF (GAUZE/BANDAGES/DRESSINGS) ×2 IMPLANT
BANDAGE ESMARK 6X9 LF (GAUZE/BANDAGES/DRESSINGS) ×1 IMPLANT
BAR EXFX 150X11 NS LF (EXFIX) ×1
BAR EXFX 400X11 NS LF (EXFIX) ×1
BAR GLASS FIBER EXFX 11X150 (EXFIX) ×1 IMPLANT
BAR GLASS FIBER EXFX 11X400 (EXFIX) ×1 IMPLANT
BIT DRILL 3.5 (BIT) ×1
BIT DRILL 3.5MM (BIT) IMPLANT
BNDG CMPR 9X6 STRL LF SNTH (GAUZE/BANDAGES/DRESSINGS)
BNDG CMPR MD 5X2 ELC HKLP STRL (GAUZE/BANDAGES/DRESSINGS) ×1
BNDG COHESIVE 6X5 TAN STRL LF (GAUZE/BANDAGES/DRESSINGS) IMPLANT
BNDG ELASTIC 2 VLCR STRL LF (GAUZE/BANDAGES/DRESSINGS) ×2 IMPLANT
BNDG ESMARK 6X9 LF (GAUZE/BANDAGES/DRESSINGS)
BNDG GAUZE ELAST 4 BULKY (GAUZE/BANDAGES/DRESSINGS) ×4 IMPLANT
BRUSH SCRUB DISP (MISCELLANEOUS) ×3 IMPLANT
CANISTER WOUND CARE 500ML ATS (WOUND CARE) ×2 IMPLANT
CATH ROBINSON RED A/P 16FR (CATHETERS) ×1 IMPLANT
CLAMP BLUE BAR TO PIN (MISCELLANEOUS) ×1 IMPLANT
CLEANER TIP ELECTROSURG 2X2 (MISCELLANEOUS) ×1 IMPLANT
COVER SURGICAL LIGHT HANDLE (MISCELLANEOUS) ×2 IMPLANT
CUFF TOURNIQUET SINGLE 18IN (TOURNIQUET CUFF) IMPLANT
CUFF TOURNIQUET SINGLE 24IN (TOURNIQUET CUFF) IMPLANT
CUFF TOURNIQUET SINGLE 34IN LL (TOURNIQUET CUFF) IMPLANT
DRAPE C-ARM 42X72 X-RAY (DRAPES) ×2 IMPLANT
DRAPE C-ARMOR (DRAPES) ×2 IMPLANT
DRAPE U-SHAPE 47X51 STRL (DRAPES) ×1 IMPLANT
DRILL BIT 3.5MM (BIT) ×2
DRSG ADAPTIC 3X8 NADH LF (GAUZE/BANDAGES/DRESSINGS) IMPLANT
DRSG MEPITEL 4X7.2 (GAUZE/BANDAGES/DRESSINGS) ×1 IMPLANT
DRSG VAC ATS SM SENSATRAC (GAUZE/BANDAGES/DRESSINGS) ×2 IMPLANT
ELECT REM PT RETURN 9FT ADLT (ELECTROSURGICAL) ×2
ELECTRODE REM PT RTRN 9FT ADLT (ELECTROSURGICAL) ×1 IMPLANT
EVACUATOR 1/8 PVC DRAIN (DRAIN) IMPLANT
GAUZE SPONGE 4X4 12PLY STRL (GAUZE/BANDAGES/DRESSINGS) ×2 IMPLANT
GLOVE BIO SURGEON STRL SZ7.5 (GLOVE) ×3 IMPLANT
GLOVE BIO SURGEON STRL SZ8 (GLOVE) ×3 IMPLANT
GLOVE BIOGEL PI IND STRL 6.5 (GLOVE) IMPLANT
GLOVE BIOGEL PI IND STRL 7.0 (GLOVE) IMPLANT
GLOVE BIOGEL PI IND STRL 7.5 (GLOVE) ×1 IMPLANT
GLOVE BIOGEL PI IND STRL 8 (GLOVE) ×1 IMPLANT
GLOVE BIOGEL PI INDICATOR 6.5 (GLOVE) ×1
GLOVE BIOGEL PI INDICATOR 7.0 (GLOVE) ×2
GLOVE BIOGEL PI INDICATOR 7.5 (GLOVE) ×1
GLOVE BIOGEL PI INDICATOR 8 (GLOVE) ×1
GLOVE SURG SS PI 6.5 STRL IVOR (GLOVE) ×2 IMPLANT
GLOVE SURG SS PI 7.0 STRL IVOR (GLOVE) ×2 IMPLANT
GLOVE SURG SS PI 8.0 STRL IVOR (GLOVE) ×2 IMPLANT
GOWN STRL REUS W/ TWL LRG LVL3 (GOWN DISPOSABLE) ×2 IMPLANT
GOWN STRL REUS W/ TWL XL LVL3 (GOWN DISPOSABLE) ×2 IMPLANT
GOWN STRL REUS W/TWL LRG LVL3 (GOWN DISPOSABLE) ×4
GOWN STRL REUS W/TWL XL LVL3 (GOWN DISPOSABLE) ×4
H R LUBE JELLY XXX (MISCELLANEOUS) ×1 IMPLANT
HALF PIN 3MM (PIN) ×1 IMPLANT
HANDPIECE INTERPULSE COAX TIP (DISPOSABLE) ×2
KIT BASIN OR (CUSTOM PROCEDURE TRAY) ×2 IMPLANT
KIT ROOM TURNOVER OR (KITS) ×2 IMPLANT
MANIFOLD NEPTUNE II (INSTRUMENTS) ×2 IMPLANT
NAIL FLEX WIN 3.0MM (Nail) ×1 IMPLANT
NEEDLE 22X1 1/2 (OR ONLY) (NEEDLE) IMPLANT
NS IRRIG 1000ML POUR BTL (IV SOLUTION) ×2 IMPLANT
PACK ORTHO EXTREMITY (CUSTOM PROCEDURE TRAY) ×2 IMPLANT
PAD ARMBOARD 7.5X6 YLW CONV (MISCELLANEOUS) ×3 IMPLANT
PADDING CAST COTTON 6X4 STRL (CAST SUPPLIES) ×3 IMPLANT
PIN 3MM (PIN) ×1 IMPLANT
SET HNDPC FAN SPRY TIP SCT (DISPOSABLE) ×1 IMPLANT
SPONGE LAP 18X18 X RAY DECT (DISPOSABLE) IMPLANT
SPONGE SCRUB IODOPHOR (GAUZE/BANDAGES/DRESSINGS) ×1 IMPLANT
STAPLER VISISTAT 35W (STAPLE) IMPLANT
STOCKINETTE IMPERVIOUS LG (DRAPES) IMPLANT
STRIP CLOSURE SKIN 1/2X4 (GAUZE/BANDAGES/DRESSINGS) IMPLANT
SUCTION FRAZIER TIP 10 FR DISP (SUCTIONS) ×1 IMPLANT
SUT ETHILON 2 0 PSLX (SUTURE) ×3 IMPLANT
SUT ETHILON 3 0 FSL (SUTURE) ×1 IMPLANT
SUT ETHILON 3 0 PS 1 (SUTURE) IMPLANT
SUT PDS AB 2-0 CT1 27 (SUTURE) ×1 IMPLANT
SUT VIC AB 0 CT1 27 (SUTURE) ×4
SUT VIC AB 0 CT1 27XBRD ANBCTR (SUTURE) ×2 IMPLANT
SUT VIC AB 2-0 CT1 27 (SUTURE) ×4
SUT VIC AB 2-0 CT1 TAPERPNT 27 (SUTURE) ×2 IMPLANT
SYR CONTROL 10ML LL (SYRINGE) IMPLANT
TOWEL OR 17X24 6PK STRL BLUE (TOWEL DISPOSABLE) ×2 IMPLANT
TOWEL OR 17X26 10 PK STRL BLUE (TOWEL DISPOSABLE) ×2 IMPLANT
TUBE CONNECTING 12X1/4 (SUCTIONS) ×2 IMPLANT
UNDERPAD 30X30 INCONTINENT (UNDERPADS AND DIAPERS) ×2 IMPLANT
WATER STERILE IRR 1000ML POUR (IV SOLUTION) ×2 IMPLANT
YANKAUER SUCT BULB TIP NO VENT (SUCTIONS) ×2 IMPLANT

## 2015-03-24 NOTE — Anesthesia Procedure Notes (Signed)
Procedure Name: Intubation Date/Time: 03/24/2015 8:13 AM Performed by: Kyung Rudd Pre-anesthesia Checklist: Patient identified, Emergency Drugs available, Suction available, Patient being monitored and Timeout performed Patient Re-evaluated:Patient Re-evaluated prior to inductionOxygen Delivery Method: Circle system utilized Preoxygenation: Pre-oxygenation with 100% oxygen Intubation Type: IV induction Ventilation: Mask ventilation without difficulty Laryngoscope Size: Mac and 3 Grade View: Grade I Tube type: Oral Tube size: 7.5 mm Number of attempts: 1 Airway Equipment and Method: Stylet Placement Confirmation: ETT inserted through vocal cords under direct vision,  breath sounds checked- equal and bilateral and positive ETCO2 Secured at: 21 cm Tube secured with: Tape Dental Injury: Teeth and Oropharynx as per pre-operative assessment

## 2015-03-24 NOTE — Anesthesia Postprocedure Evaluation (Signed)
  Anesthesia Post-op Note  Patient: Justin [name]  Procedure(s) Performed: Procedure(s): IRRIGATION AND DEBRIDEMENT  EXTERNAL FIXATURE REVISION LEFT ANKLE  (Left)  Patient Location: PACU  Anesthesia Type:General  Level of Consciousness: awake, alert , oriented and patient cooperative  Airway and Oxygen Therapy: Patient Spontanous Breathing and Patient connected to nasal cannula oxygen  Post-op Pain: mild  Post-op Assessment: Post-op Vital signs reviewed, Patient's Cardiovascular Status Stable, Respiratory Function Stable, Patent Airway, No signs of Nausea or vomiting and Pain level controlled LLE Motor Response: Purposeful movement, Responds to commands LLE Sensation: Full sensation, No numbness, No tingling, Pain          Post-op Vital Signs: Reviewed and stable  Last Vitals:  Filed Vitals:   03/24/15 1315  BP: 92/71  Pulse: 127  Temp:   Resp: 18    Complications: No apparent anesthesia complications

## 2015-03-24 NOTE — Progress Notes (Signed)
Orthopedic Tech Progress Note Patient Details:  Justin [name] 19-Oct-1950 [id number] Delivered to OR desk Ortho Devices Type of Ortho Device: Shoulder abduction pillow Ortho Device/Splint Interventions: Ordered   [place] 03/24/2015, 11:46 AM

## 2015-03-24 NOTE — Anesthesia Postprocedure Evaluation (Signed)
  Anesthesia Post-op Note  Patient: Justin [name]  Procedure(s) Performed: Procedure(s): CLOSED REDUCTION SHOULDER (Left) EXTERNAL FIXATION left ankle (Left) IRRIGATION AND DEBRIDEMENT OPEN TIBIA -FIBULA  FRACTURE (Left)  Patient Location: PACU  Anesthesia Type:General  Level of Consciousness: awake and alert   Airway and Oxygen Therapy: Patient Spontanous Breathing  Post-op Pain: mild  Post-op Assessment: Post-op Vital signs reviewed LLE Motor Response: Purposeful movement, Responds to commands LLE Sensation: Full sensation, No numbness, No tingling, Pain          Post-op Vital Signs: Reviewed  Last Vitals:  Filed Vitals:   03/24/15 1330  BP: 105/72  Pulse: 125  Temp: 37.1 C  Resp: 18    Complications: No apparent anesthesia complications

## 2015-03-24 NOTE — Progress Notes (Signed)
PT Cancellation Note  Patient Details Name: Justin [name] MRN: [id number] DOB: 1950/11/03   Cancelled Treatment:    Reason Eval/Treat Not Completed: Patient at procedure or test/unavailable Pt currently in OR. Will follow up next available time to perform PT re evaluation.  Marguarite Arbour A Jeani Fassnacht 03/24/2015, 9:09 AM Wray Kearns, PT, DPT [phone]

## 2015-03-24 NOTE — Progress Notes (Signed)
Physical Therapy Treatment Patient Details Name: Justin [name] MRN: [id number] DOB: 1951-08-11 Today's Date: 03/24/2015    History of Present Illness 64 yo male with fall from ladder resulting in LLE open pilon fracture and left glenohumeral dislocation, s/p I&D external fixature revision left ankle with wound vac 6/9. PMH includes stroke and Bell's Palsy.    PT Comments    Patient s/p I&D and revision of external fixature left ankle. Pt tolerated EOB exercises and bed mobility today but declined OOB today secondary to pain and fatigue. Discussed disposition options - re: CIR vs HHPT. Pt motivated and hopeful for CIR at d/c to maximize independence and mobility prior to return home.   Follow Up Recommendations  CIR     Equipment Recommendations  Other (comment) (TBD pending progress.)    Recommendations for Other Services Rehab consult;OT consult     Precautions / Restrictions Precautions Precautions: Fall;Shoulder Type of Shoulder Precautions: Pt reports shoulder was "put back in place during surgery." Plan for MRI to assess shoulder. Shoulder Interventions: Shoulder abduction pillow Precaution Booklet Issued: No Required Braces or Orthoses: Other Brace/Splint Restrictions Weight Bearing Restrictions: Yes LUE Weight Bearing: Non weight bearing LLE Weight Bearing: Non weight bearing    Mobility  Bed Mobility Overal bed mobility: Needs Assistance Bed Mobility: Supine to Sit;Sit to Supine     Supine to sit: Min assist;HOB elevated Sit to supine: Min assist   General bed mobility comments: min assist for trunk elevation; cues for pt to pull through RUE. HOB elevated, able to scoot to edge of bed.  Transfers                 General transfer comment: Deferred. Pt reports being tired and not wanting to perform transfer today.  Ambulation/Gait                 Stairs            Wheelchair Mobility    Modified Rankin (Stroke Patients Only)        Balance Overall balance assessment: Needs assistance Sitting-balance support: Feet supported;No upper extremity supported Sitting balance-Leahy Scale: Good                              Cognition Arousal/Alertness: Awake/alert Behavior During Therapy: WFL for tasks assessed/performed Overall Cognitive Status: Within Functional Limits for tasks assessed (However when therapist would ask pt a question regarding POC, pt would defer to wife. )       Memory: Decreased recall of precautions              Exercises General Exercises - Lower Extremity Long Arc Quad: Left;10 reps;Seated Straight Leg Raises: Left;5 reps;Supine    General Comments        Pertinent Vitals/Pain Pain Assessment: 0-10 Pain Score: 7  Pain Location: LLE Pain Descriptors / Indicators: Sore;Aching Pain Intervention(s): Monitored during session;Repositioned;RN gave pain meds during session    Home Living                      Prior Function            PT Goals (current goals can now be found in the care plan section) Progress towards PT goals: Progressing toward goals    Frequency  Min 5X/week    PT Plan Current plan remains appropriate    Co-evaluation  End of Session Equipment Utilized During Treatment: Gait belt Activity Tolerance: Patient tolerated treatment well;Patient limited by lethargy Patient left: in bed;with call bell/phone within reach;with bed alarm set;with family/visitor present     Time: 2993-7169 PT Time Calculation (min) (ACUTE ONLY): 18 min  Charges:  $Therapeutic Activity: 8-22 mins                    G Codes:      Libra Gatz A Ricki Vanhandel 03/24/2015, 4:22 PM Wray Kearns, PT, DPT [phone]

## 2015-03-24 NOTE — Progress Notes (Signed)
ANTIBIOTIC CONSULT NOTE - FOLLOW UP  Pharmacy Consult:  Gentamicin Indication:  Prophylaxis with stage III open fracture  No Known Allergies  Patient Measurements: Height: 5\' 9"  (175.3 cm) Weight: 185 lb (83.915 kg) IBW/kg (Calculated) : 70.7  Vital Signs: Temp: 98.8 F (37.1 C) (06/09 1330) Temp Source: Oral (06/09 1330) BP: 105/72 mmHg (06/09 1330) Pulse Rate: 125 (06/09 1330) Intake/Output from previous day: 06/08 0701 - 06/09 0700 In: 320 [P.O.:120; IV Piggyback:200] Out: -  Intake/Output from this shift: Total I/O In: 2800 [I.V.:2300; IV Piggyback:500] Out: 800 [Urine:750; Blood:50]  Labs:  Recent Labs  03/22/15 2036 03/23/15 0509 03/24/15 0530  WBC 13.7*  --  13.9*  HGB 14.6  --  11.2*  PLT 225  --  190  CREATININE 1.20 1.12 1.09   Estimated Creatinine Clearance: 70.3 mL/min (by C-G formula based on Cr of 1.09).  Recent Labs  03/24/15 1448  GENTRANDOM 1.2     Microbiology: No results found for this or any previous visit (from the past 720 hour(s)).    Assessment: 14 YOM with stage III open left pilon fracture and glenohumeral dislocation to continue on gentamicin for prophylaxis.  13 hour gentamicin level is less than 2 mcg/mL.  His renal function is stable.   Goal of Therapy:  Gentamicin trough level <2 mcg/ml   Plan:  - Continue gentamicin 530mg  IV Q24H - Monitor renal fxn, clinical progress, repeat gentamicin level as indicated    Melany Wiesman D. Mina Marble, PharmD, BCPS Pager:  [phone] 03/24/2015, 4:54 PM

## 2015-03-24 NOTE — Progress Notes (Signed)
OT Cancellation Note  Patient Details Name: Justin [name] MRN: [id number] DOB: 07-15-1951   Cancelled Treatment:    Reason Eval/Treat Not Completed: Patient at procedure or test/ unavailable (Pt in OR). Will check back as able for OT eval/treat.   Draven Natter , MS, OTR/L, CLT Pager: [phone]  03/24/2015, 10:58 AM

## 2015-03-24 NOTE — Op Note (Signed)
Justin [name], Justin [name] NO.:  [id number]  MEDICAL RECORD NO.:  [id number]  LOCATION:  5N06C                        FACILITY:  [facility]  PHYSICIAN:  Justin [name], M.D. DATE OF BIRTH:  1951-05-03  DATE OF PROCEDURE:  03/24/2015 DATE OF DISCHARGE:                              OPERATIVE REPORT   PREOPERATIVE DIAGNOSES: 1. Left grade 3A open left pilon, fibula and tibia. 2. Left shoulder redislocation.  POSTOPERATIVE DIAGNOSES: 1. Left grade 3A open left pilon, fibula and tibia. 2. Left shoulder redislocation.  PROCEDURES: 1. Open reduction and internal fixation of left pilon, tibia and     fibula. 2. Incision and drainage of left open fracture with debridement of     bone. 3. Closed reduction of left shoulder dislocation. 4. Stress fluoro examination of the left shoulder following reduction.  SURGEON:  Justin [name], M.D.  ASSISTANT:  Justin Pigg, PA-C.  ANESTHESIA:  General.  COMPLICATIONS:  None.  TOURNIQUET:  None.  SPECIMENS:  None.  DISPOSITION:  To PACU.  CONDITION:  Stable.  BRIEF SUMMARY AND INDICATION FOR PROCEDURE:  Justin [name] is a 64 year old male, who fell off a ladder in his barn and called for assistance with an open fracture.  He underwent initial debridement and external fixation by Dr. Rod [name] as well as a closed reduction of his left shoulder. Given the complexity of the open pilon fracture pattern, Dr. Lyla [name] asserted this was outside his scope of practice and that it would be in the best interest of the patient to have this injury evaluated and treated by a fellowship trained orthopaedic traumatologist. Consequently, I was asked to consult and assume management.  Also, subsequent to the intial procedure, the patient re-dislocated the left shoulder.  I contacted three different shoulder surgeons regarding definitive treatment in the event of persistent instability and each recommended that he be evaluated in the OR  and as he was scheduled for the first case today that an additional several hours out in order to avoid two Procedures and anesthetic events would be appropriate.  I did discuss with the patient the risks and benefits of surgery including the potential for redislocation, failure to prevent infection given his open fracture, which could be limb threatening, DVT, PE, heart attack, stroke, and loss of motion, arthritis among others.  They acknowledged these risks as did his wife and they wished to proceed.  BRIEF SUMMARY OF PROCEDURE:  Justin [name] was given 2 g of Ancef, taken to the operating room where general anesthesia was induced.  His left shoulder was evaluated first.  C-arm was brought in and a reduction maneuver performed consisting of longitudinal traction, about 60 degrees of abduction in an anterior direction with cantilever directed posteriorly against the humeral head, which resulted in a smooth reduction.  C-arm was then used to evaluate the AP projections in internal and external rotation as well as an axillary in internal and external rotations.  The patient's left shoulder appeared to be relatively stable, although Hill-Sachs deformity was notable on prior films.  I could not appreciate a large Hill-Sachs defect today in the OR on the fluoro images.  I did place the patient in  an abduction pillow sling and was careful to control the shoulder during awakening from anesthesia.  The patient was rather mobile during his recovery of consciousness, but did not appear to dislocate the shoulder and films will be obtained in the PACU.  Again, I did control and have digital pressure on it throughout the waking process.  Justin Spinner, PA-C did assist me during that reduction and evaluation of stabilizing the chest wall.  The left lower extremity was evaluated after removal of the dressing and the wound appeared to be well approximated with nylon suture was slight serosanguineous.   There were some areas of erythema and soft tissue confusion along the wound edges.  No purulence and no odor.  The leg was prepped and draped in usual sterile fashion.  The fixator was removed in order to facilitate optimal debridement with delivery of the tibia through the traumatic wound.  I did remove several loose pieces of bone including a cortical segment that was in the metaphysis along the lateral edge.  There appeared to be complete stripping of the lateral distal fibula through the incision.  I did use 6000 mL of Pulsavac both on the tibia and the fibula and was careful to avoid injury to the soft tissue envelope by protecting it from the pulsed saline.  I did use some gentle lavage without pressure.  In addition of excising bone, some fascia and tenosynovium along the medial side was debrided, which was not viable.  I then turned our attention to the reduction where it was difficult to control translation and length.  We did achieve length eventually with complete pharmacologic paralysis and this was accomplished by the use of both my assistant and additional scrub assist, pulling longitudinal traction on the calcaneal pin that had been placed by Dr. Lyla [name] and using the anterior clamp as well.  I then placed two additional pins into the first and fifth metatarsals and additional bars to this area in order to control the ankle position and reduction of this very distal pilon fracture.  Then, through the open wound, we were able to manipulate and obtain a reduction of the fibula placing a 3-mm Biomet elastic nail and checking its position in place under both AP and lateral images.  I then turned attention to reduction and fixation of the tibia in particular to reduce the translation and angular deformity. This was done by performing a manual reduction through the open wound pushing the distal metaphysis both posteriorly and laterally and then placing a 2-mm pin up to the  medial malleolus across the fracture site and through the anterior cortex.  In this manner, we were able to achieve a very satisfactory reduction.  The wound was lavaged once more and closed with a mixture of 2-0 PDS and 3-0 and 2-0 nylon using far- near-near-far sutures.  Because of the subcutaneous destruction at the traumatic wound, I was unable to place any subcu sutures in this area and relied again on the nylon.  Pictures were taken both before and after closure for review with our plastic surgeon colleague.  Justin Spinner, PA-C assisted me throughout as noted above and this include application of an incisional wound VAC after closure and also had additional assistance.  I could not complete it without the assistance. Sterile gently compressive dressing was applied and then the patient was taken to the PACU in stable condition.  PROGNOSIS:  Mr. Carrigan remains at elevated risk for deep infection, which could be limb threatening, nonunion and  malunion event that we do not perform any other procedures to optimize his reduction because of the soft tissue concerns.  He will be on formal pharmacologic DVT prophylaxis and will be stable for discharge after additional 48 hours.  We will obtain an MRI of his left shoulder to direct further treatment and management in the event of persistent instability.  I look to refer him to one of my colleagues.     Justin [name], M.D.     MHH/MEDQ  D:  03/24/2015  T:  03/24/2015  Job:  [id number]

## 2015-03-24 NOTE — Anesthesia Preprocedure Evaluation (Addendum)
Anesthesia Evaluation  Patient identified by MRN, date of birth, ID band Patient awake    Reviewed: Allergy & Precautions, NPO status , Patient's Chart, lab work & pertinent test results  History of Anesthesia Complications Negative for: history of anesthetic complications  Airway Mallampati: II  TM Distance: >3 FB Neck ROM: Full    Dental  (+) Chipped, Dental Advisory Given   Pulmonary neg pulmonary ROS,  breath sounds clear to auscultation        Cardiovascular Rhythm:Regular Rate:Normal  '14 ECHO: EF 60-65%, valves OK   Neuro/Psych TIACVA, No Residual Symptoms    GI/Hepatic Neg liver ROS, GERD-  Controlled,  Endo/Other  negative endocrine ROS  Renal/GU negative Renal ROS     Musculoskeletal   Abdominal   Peds  Hematology negative hematology ROS (+)   Anesthesia Other Findings   Reproductive/Obstetrics                           Anesthesia Physical Anesthesia Plan  ASA: III  Anesthesia Plan: General   Post-op Pain Management:    Induction: Intravenous  Airway Management Planned: LMA  Additional Equipment:   Intra-op Plan:   Post-operative Plan:   Informed Consent: I have reviewed the patients History and Physical, chart, labs and discussed the procedure including the risks, benefits and alternatives for the proposed anesthesia with the patient or authorized representative who has indicated his/her understanding and acceptance.   Dental advisory given  Plan Discussed with: CRNA and Surgeon  Anesthesia Plan Comments: (Plan routine monitors, GA- LMA OK)        Anesthesia Quick Evaluation

## 2015-03-24 NOTE — Transfer of Care (Signed)
Immediate Anesthesia Transfer of Care Note  Patient: Justin [name]  Procedure(s) Performed: Procedure(s): IRRIGATION AND DEBRIDEMENT  EXTERNAL FIXATURE REVISION LEFT ANKLE  (Left)  Patient Location: PACU  Anesthesia Type:General  Level of Consciousness: awake, alert  and oriented  Airway & Oxygen Therapy: Patient Spontanous Breathing and Patient connected to face mask oxygen  Post-op Assessment: Report given to RN, Post -op Vital signs reviewed and stable and Patient moving all extremities  Post vital signs: Reviewed and stable  Last Vitals:  Filed Vitals:   03/24/15 0659  BP: 101/61  Pulse: 137  Temp: 37.8 C  Resp: 20    Complications: No apparent anesthesia complications

## 2015-03-25 ENCOUNTER — Inpatient Hospital Stay (HOSPITAL_COMMUNITY): Payer: BLUE CROSS/BLUE SHIELD

## 2015-03-25 ENCOUNTER — Encounter (HOSPITAL_COMMUNITY): Payer: Self-pay | Admitting: Orthopedic Surgery

## 2015-03-25 DIAGNOSIS — Q2112 Patent foramen ovale: Secondary | ICD-10-CM | POA: Diagnosis present

## 2015-03-25 DIAGNOSIS — I639 Cerebral infarction, unspecified: Secondary | ICD-10-CM

## 2015-03-25 DIAGNOSIS — Q211 Atrial septal defect: Secondary | ICD-10-CM

## 2015-03-25 DIAGNOSIS — I6340 Cerebral infarction due to embolism of unspecified cerebral artery: Secondary | ICD-10-CM | POA: Insufficient documentation

## 2015-03-25 DIAGNOSIS — I63432 Cerebral infarction due to embolism of left posterior cerebral artery: Secondary | ICD-10-CM

## 2015-03-25 LAB — POCT I-STAT EG7
ACID-BASE DEFICIT: 3 mmol/L — AB (ref 0.0–2.0)
Bicarbonate: 24.3 mEq/L — ABNORMAL HIGH (ref 20.0–24.0)
CALCIUM ION: 1.15 mmol/L (ref 1.13–1.30)
HCT: 30 % — ABNORMAL LOW (ref 39.0–52.0)
Hemoglobin: 10.2 g/dL — ABNORMAL LOW (ref 13.0–17.0)
O2 SAT: 53 %
Potassium: 3.9 mmol/L (ref 3.5–5.1)
Sodium: 137 mmol/L (ref 135–145)
TCO2: 26 mmol/L (ref 0–100)
pCO2, Ven: 56.4 mmHg — ABNORMAL HIGH (ref 45.0–50.0)
pH, Ven: 7.244 — ABNORMAL LOW (ref 7.250–7.300)
pO2, Ven: 34 mmHg (ref 30.0–45.0)

## 2015-03-25 LAB — GLUCOSE, CAPILLARY: Glucose-Capillary: 133 mg/dL — ABNORMAL HIGH (ref 65–99)

## 2015-03-25 LAB — POCT I-STAT 4, (NA,K, GLUC, HGB,HCT)
Glucose, Bld: 127 mg/dL — ABNORMAL HIGH (ref 65–99)
HCT: 34 % — ABNORMAL LOW (ref 39.0–52.0)
Hemoglobin: 11.6 g/dL — ABNORMAL LOW (ref 13.0–17.0)
POTASSIUM: 3.2 mmol/L — AB (ref 3.5–5.1)
SODIUM: 138 mmol/L (ref 135–145)

## 2015-03-25 LAB — CBC
HCT: 26.2 % — ABNORMAL LOW (ref 39.0–52.0)
Hemoglobin: 8.7 g/dL — ABNORMAL LOW (ref 13.0–17.0)
MCH: 28.8 pg (ref 26.0–34.0)
MCHC: 33.2 g/dL (ref 30.0–36.0)
MCV: 86.8 fL (ref 78.0–100.0)
PLATELETS: 142 10*3/uL — AB (ref 150–400)
RBC: 3.02 MIL/uL — ABNORMAL LOW (ref 4.22–5.81)
RDW: 14 % (ref 11.5–15.5)
WBC: 10.3 10*3/uL (ref 4.0–10.5)

## 2015-03-25 LAB — ANTITHROMBIN III: AntiThromb III Func: 70 % — ABNORMAL LOW (ref 75–120)

## 2015-03-25 LAB — VITAMIN D 25 HYDROXY (VIT D DEFICIENCY, FRACTURES): Vit D, 25-Hydroxy: 30.7 ng/mL (ref 30.0–100.0)

## 2015-03-25 MED ORDER — ASPIRIN EC 81 MG PO TBEC
81.0000 mg | DELAYED_RELEASE_TABLET | Freq: Every day | ORAL | Status: DC
  Administered 2015-03-25 – 2015-03-26 (×2): 81 mg via ORAL
  Filled 2015-03-25 (×2): qty 1

## 2015-03-25 MED ORDER — GADOBENATE DIMEGLUMINE 529 MG/ML IV SOLN
20.0000 mL | Freq: Once | INTRAVENOUS | Status: AC | PRN
  Administered 2015-03-25: 20 mL via INTRAVENOUS

## 2015-03-25 NOTE — Progress Notes (Signed)
[facility] Progress Note  Subjective  Events overnight noted- CT brain with findings suggestive of acute ischemia left medial occipital lobe, likely embolism to the left PCA.  Greatly appreciate all assistance with care of pt  Pt now resting comfortably on 5N Remains on tele Eating breakfast Feeding self w/o a problem  States vision is improving    ? If pts Bells palsy is really that  ? If this may be why pt fell off ladder as he was certain his ladder was completely stable    Review of Systems  Eyes: Positive for blurred vision (improving ).  Respiratory: Negative for shortness of breath and wheezing.   Cardiovascular: Negative for chest pain and palpitations.  Genitourinary: Negative for dysuria.  Musculoskeletal:       Soreness L shoulder and L ankle   no tingling or numbness L arm and Leg    Objective   BP 125/75 mmHg  Pulse 118  Temp(Src) 99 F (37.2 C) (Oral)  Resp 20  Ht 5\' 9"  (1.753 m)  Wt 83.915 kg (185 lb)  BMI 27.31 kg/m2  SpO2 100%  Intake/Output      06/09 0701 - 06/10 0700 06/10 0701 - 06/11 0700   P.O. 240 120   I.V. (mL/kg) 2300 (27.4)    IV Piggyback 500    Total Intake(mL/kg) 3040 (36.2) 120 (1.4)   Urine (mL/kg/hr) 1100 (0.5)    Blood 50 (0)    Total Output 1150     Net +1890 +120        Urine Occurrence 1 x      Labs  Results for SYAIRE, SABER (MRN [id number]) as of 03/25/2015 09:13  Ref. Range 03/25/2015 08:15  WBC Latest Ref Range: 4.0-10.5 K/uL 10.3  RBC Latest Ref Range: 4.22-5.81 MIL/uL 3.02 (L)  Hemoglobin Latest Ref Range: 13.0-17.0 g/dL 8.7 (L)  HCT Latest Ref Range: 39.0-52.0 % 26.2 (L)  MCV Latest Ref Range: 78.0-100.0 fL 86.8  MCH Latest Ref Range: 26.0-34.0 pg 28.8  MCHC Latest Ref Range: 30.0-36.0 g/dL 33.2  RDW Latest Ref Range: 11.5-15.5 % 14.0  Platelets Latest Ref Range: 150-400 K/uL 142 (L)  Results for TRYSTYN, DOLLEY (MRN [id number]) as of 03/25/2015 09:13  Ref. Range 03/24/2015 05:30  Vit D,  25-Hydroxy Latest Ref Range: 30.0-100.0 ng/mL 30.7    Exam  Gen: awake and alert, appears comfortable but tired as hes been up since about 0130 Lungs: clear anterior fields Cardiac: reg but tachy  Ext:       Left Upper Extremity   Abduction pillow fitting well  Motor and sensory functions grossly intact  Ext warm  + radial pulse        Left Lower Extremity   Ex fix stable and intact, dressing stable  Proximal pinsite dressing saturated   VAC off, battery dead. Nurse plugged unit in and turned on  VAC functioning well, good seal  DPN, SPN, TN sensation grossly intact  Ext warm  EHL, FHL, lesser toe motor functions intact  Swelling stable   Imaging   MRI L shoulder IMPRESSION: 1. Small osseous Bankart lesion with avulsion of the anterior and inferior aspects of the labrum. 2. Avulsion of the glenoid attachment of the inferior glenohumeral ligament. 3. Multiple muscle strains around the shoulder. 4. Tear of the posterior capsule 5. Prominent Hill-Sachs lesion.   CT head  IMPRESSION: Acute LEFT posterior cerebral artery territory infarct.   Small area LEFT parietal encephalomalacia consistent with remote ischemia. Minimal  white matter changes most consistent chronic small vessel ischemic disease.   Assessment and Plan   POD/HD#: 1  1. Grade 3 open left distal tib-fib fracture status post I&D and external fixation revision, closed left anterior shoulder dislocation status post re-reduction              ex fix revised and repeat I&D performed  Flexible fibular nail placed and tibia pinned with k-wire   Shoulder reduced again and was stable with stressing in OR    NWB L upper extremity and L lower extremity    Abduction pillow to L UEx at all times for now  Continue with ice and elevation  pinsite care as needed  2. Pain management:             Continue with current management  3. ABL anemia/Hemodynamics             CBC in a.m.  4. Medical issues               acute ischemia left medial occipital lobe, likely embolism to the left PCA.   Per Stroke team   Will contact cards to make them aware   Pt restarted on ASA    Orders place      Labs placed to eval for hypercoaguable state    Echo and carotid dopplers placed    MRA/MRI of brain     Pt on tele    Pt had TEE after last stroke in 2014 which did show PFO        ? If pts Bells palsy is really that    ? If this may be why pt fell off ladder as he was certain his ladder was completely stable    5. DVT/PE prophylaxis:             continue lovenox   6. ID:               Continue with Ancef, gentamicin and penicillin until further notice  7. Metabolic Bone Disease:             vitamin d levels borderline low   Add vitamin d 3 1000 IUs bid    Add vitamin d 2 50000 IUs daily x 8 weeks    Hgb A1c 5.9%  8. Activity:             Activity as tolerated             Non-Weightbearing left upper extremity and left lower extremity             Okay to transfer from bed to chair with assist  9. FEN/Foley/Lines:             advance diet  Pt eating well- will order swallow eval given recent events  10.Ex-fix/Splint care:             ok to manipulate extremity by fixator  Change pinsite dressings as needed   11. Dispo:        CT of L ankle pending  W/u of stroke ongoing  Contacted cards to interrogate loop recorder and consult if arrhythmia present      [name], PA-C [facility] [phone] (O) 03/25/2015 8:56 AM

## 2015-03-25 NOTE — Progress Notes (Signed)
  Echocardiogram 2D Echocardiogram has been performed.  Jeanny Rymer FRANCES 03/25/2015, 10:48 AM

## 2015-03-25 NOTE — Consult Note (Addendum)
Referring Physician: Dr Marcelino Scot    Chief Complaint: visual loss right eye, right face weakness  HPI:                                                                                                                                         Justin [name] is an 64 y.o. male with a past medical history significant for hypercholesterolemia, TIA, left cortical parietal infarct on 7/8/14without residual deficits, s/p loop recorder implantation, Bell's palsy, who fell off a ladder in his barn with resulting left grade 3A open left pilon, fibula and tibia and left shoulder dislocation for which he underwent surgery on 6/7 and 6/9. He woke up from a nap last night complaining to his nurse of having visual difficulty. Rapid response nurse was called and patient was noted to have a right visual field cut and right face weakness (later on realized this is not a new finding). Patient was sent for STAT CT brain that was personally reviewed and revealed evidence of focal blurring of the LEFT medial occipital lobe gray-white matter. He denies HA, vertigo, double vision, focal weakness or numbness, slurred speech, confusion, or language impairment.  Date last known well: 03/24/15 Time last known well: 8 pm tPA Given: no, out of the window NIHSS: 6   Past Medical History  Diagnosis Date  . Bell's palsy 1980's; [id number]; 2000's; 12/2012; 03/2013    "multiple times" (04/21/2013)  . TIA (transient ischemic attack) 04/21/2013  . Stroke   . Hypercholesteremia     Past Surgical History  Procedure Laterality Date  . Tee without cardioversion N/A 04/24/2013    Procedure: TRANSESOPHAGEAL ECHOCARDIOGRAM (TEE);  Surgeon: Thayer Headings, MD;  Location: [facility];  Service: Cardiovascular;  Laterality: N/A;  . Implanted loop recorder    . Colonoscopy N/A 07/13/2013    Procedure: COLONOSCOPY;  Surgeon: Danie Binder, MD;  [facility];  Service: Endoscopy;  Laterality: N/A;  8:30 AM  . Loop recorder implant N/A  04/24/2013    Procedure: LOOP RECORDER IMPLANT;  Surgeon: Deboraha Sprang, MD;  Location: [facility] C[facility];  Service: Cardiovascular;  Laterality: N/A;  . Shoulder closed reduction Left 03/22/2015    Procedure: CLOSED REDUCTION SHOULDER;  Surgeon: Rod Can, MD;  Location: [facility];  Service: Orthopedics;  Laterality: Left;  . External fixation leg Left 03/22/2015    Procedure: EXTERNAL FIXATION left ankle;  Surgeon: Rod Can, MD;  Location: [place];  Service: Orthopedics;  Laterality: Left;  . I&d extremity Left 03/22/2015    Procedure: IRRIGATION AND DEBRIDEMENT OPEN TIBIA -FIBULA  FRACTURE;  Surgeon: Rod Can, MD;  Location: [place];  Service: Orthopedics;  Laterality: Left;    Family History  Problem Relation Age of Onset  . Heart disease Mother   . Alcohol abuse Mother   . Colon cancer Neg Hx    Social History:  reports that  he has never smoked. He has never used smokeless tobacco. He reports that he drinks alcohol. He reports that he does not use illicit drugs.  Allergies: No Known Allergies  Medications:                                                                                                                           Scheduled: . acetaminophen  1,000 mg Oral Q6H  .  ceFAZolin (ANCEF) IV  2 g Intravenous Q6H  . docusate sodium  100 mg Oral BID  . enoxaparin (LOVENOX) injection  40 mg Subcutaneous Q24H  . gentamicin  7 mg/kg (Adjusted) Intravenous Q24H  . pencillin G potassium IV  2 Million Units Intravenous 4 times per day  . pregabalin  75 mg Oral BID  . senna  1 tablet Oral BID  . simvastatin  10 mg Oral q1800    ROS:                                                                                                                                       History obtained from the patient and chart review  General ROS: negative for - chills, fatigue, fever, night sweats, weight gain or weight loss Psychological ROS: negative for - behavioral disorder, hallucinations,  memory difficulties, mood swings or suicidal ideation Ophthalmic ROS: negative for - blurry vision, double vision or eye pain  ENT ROS: negative for - epistaxis, nasal discharge, oral lesions, sore throat, tinnitus or vertigo Allergy and Immunology ROS: negative for - hives or itchy/watery eyes Hematological and Lymphatic ROS: negative for - bleeding problems, bruising or swollen lymph nodes Endocrine ROS: negative for - galactorrhea, hair pattern changes, polydipsia/polyuria or temperature intolerance Respiratory ROS: negative for - cough, hemoptysis, shortness of breath or wheezing Cardiovascular ROS: negative for - chest pain, dyspnea on exertion, edema or irregular heartbeat Gastrointestinal ROS: negative for - abdominal pain, diarrhea, hematemesis, nausea/vomiting or stool incontinence Genito-Urinary ROS: negative for - dysuria, hematuria, incontinence or urinary frequency/urgency Musculoskeletal ROS: negative for - joint swelling or muscular weakness Neurological ROS: as noted in HPI Dermatological ROS: negative for rash and skin lesion changes    Physical exam: pleasant male in no apparent distress. Blood pressure 124/75, pulse 129, temperature 99.4 F (37.4 C), temperature source Oral, resp. rate 18, height 5' 9"  (1.753 m), weight 83.915 kg (185 lb), SpO2 93 %. Head:  normocephalic. Neck: supple, no bruits, no JVD. Cardiac: no murmurs. Lungs: clear. Abdomen: soft, no tender, no mass. Extremities: no edema. External fixator left LE. S/p surgery left shoulder Skin: no rash  Neurologic Examination:                                                                                                      General: Mental Status: Alert, oriented, thought content appropriate.  Speech fluent without evidence of aphasia.  Able to follow 3 step commands without difficulty. Cranial Nerves: II: Discs flat bilaterally; Visual fields intact, pupils equal, round, reactive to light and  accommodation III,IV, VI: ptosis not present, extra-ocular motions intact bilaterally V,VII: smile symmetric, facial light touch sensation normal bilaterally VIII: hearing normal bilaterally IX,X: uvula rises symmetrically XI: bilateral shoulder shrug XII: midline tongue extension without atrophy or fasciculations  Motor: Moves right side but can not test left arm and left LE due to recent surgery Tone and bulk:normal tone throughout; no atrophy noted Sensory: Pinprick and light touch intact throughout, bilaterally Deep Tendon Reflexes:  1+ right side, unable to test in the left. Plantars: No tested Cerebellar: Normal FTN  In the right, can not test in the left. Gait:  Unable to test due to surgery with external fixator left LE.    Results for orders placed or performed during the hospital encounter of 03/22/15 (from the past 48 hour(s))  Hemoglobin A1c     Status: Abnormal   Collection Time: 03/23/15  5:00 AM  Result Value Ref Range   Hgb A1c MFr Bld 5.9 (H) 4.8 - 5.6 %    Comment: (NOTE)         Pre-diabetes: 5.7 - 6.4         Diabetes: >6.4         Glycemic control for adults with diabetes: <7.0    Mean Plasma Glucose 123 mg/dL    Comment: (NOTE) Performed At: [facility] [place], [place] [id number] Lindon Romp MD MP:[id number]   Basic metabolic panel     Status: Abnormal   Collection Time: 03/23/15  5:09 AM  Result Value Ref Range   Sodium 141 135 - 145 mmol/L   Potassium 4.1 3.5 - 5.1 mmol/L   Chloride 105 101 - 111 mmol/L   CO2 26 22 - 32 mmol/L   Glucose, Bld 162 (H) 65 - 99 mg/dL   BUN 15 6 - 20 mg/dL   Creatinine, Ser 1.12 0.61 - 1.24 mg/dL   Calcium 8.7 (L) 8.9 - 10.3 mg/dL   GFR calc non Af Amer >60 >60 mL/min   GFR calc Af Amer >60 >60 mL/min    Comment: (NOTE) The eGFR has been calculated using the CKD EPI equation. This calculation has not been validated in all clinical situations. eGFR's persistently <60 mL/min signify  possible Chronic Kidney Disease.    Anion gap 10 5 - 15  CBC     Status: Abnormal   Collection Time: 03/24/15  5:30 AM  Result Value Ref Range   WBC 13.9 (H) 4.0 -  10.5 K/uL   RBC 3.85 (L) 4.22 - 5.81 MIL/uL   Hemoglobin 11.2 (L) 13.0 - 17.0 g/dL    Comment: REPEATED TO VERIFY SPECIMEN CHECKED FOR CLOTS    HCT 33.9 (L) 39.0 - 52.0 %   MCV 88.1 78.0 - 100.0 fL   MCH 29.1 26.0 - 34.0 pg   MCHC 33.0 30.0 - 36.0 g/dL   RDW 14.1 11.5 - 15.5 %   Platelets 190 150 - 400 K/uL  APTT     Status: None   Collection Time: 03/24/15  5:30 AM  Result Value Ref Range   aPTT 32 24 - 37 seconds  Protime-INR     Status: Abnormal   Collection Time: 03/24/15  5:30 AM  Result Value Ref Range   Prothrombin Time 15.8 (H) 11.6 - 15.2 seconds   INR 1.25 0.00 - 1.49  Comprehensive metabolic panel     Status: Abnormal   Collection Time: 03/24/15  5:30 AM  Result Value Ref Range   Sodium 135 135 - 145 mmol/L   Potassium 4.1 3.5 - 5.1 mmol/L   Chloride 102 101 - 111 mmol/L   CO2 25 22 - 32 mmol/L   Glucose, Bld 135 (H) 65 - 99 mg/dL   BUN 12 6 - 20 mg/dL   Creatinine, Ser 1.09 0.61 - 1.24 mg/dL   Calcium 8.1 (L) 8.9 - 10.3 mg/dL   Total Protein 5.3 (L) 6.5 - 8.1 g/dL   Albumin 3.1 (L) 3.5 - 5.0 g/dL   AST 27 15 - 41 U/L   ALT 18 17 - 63 U/L   Alkaline Phosphatase 49 38 - 126 U/L   Total Bilirubin 0.7 0.3 - 1.2 mg/dL   GFR calc non Af Amer >60 >60 mL/min   GFR calc Af Amer >60 >60 mL/min    Comment: (NOTE) The eGFR has been calculated using the CKD EPI equation. This calculation has not been validated in all clinical situations. eGFR's persistently <60 mL/min signify possible Chronic Kidney Disease.    Anion gap 8 5 - 15  Gentamicin level, random     Status: None   Collection Time: 03/24/15  2:48 PM  Result Value Ref Range   Gentamicin Rm 1.2 ug/mL    Comment:        Random Gentamicin therapeutic range is dependent on dosage and time of specimen collection. A peak range is 5.0-10.0  ug/mL A trough range is 0.5-2.0 ug/mL        Performed at [facility]   Glucose, capillary     Status: Abnormal   Collection Time: 03/25/15  2:28 AM  Result Value Ref Range   Glucose-Capillary 133 (H) 65 - 99 mg/dL   Dg Ankle Complete Left  03/24/2015   CLINICAL DATA:  Left ankle fixation .  EXAM: LEFT ANKLE COMPLETE - 3+ VIEW  COMPARISON:  CT 03/23/2015 .  FINDINGS: Open reduction internal fixation of the distal tibia and fibula noted. Large soft tissue defect is noted medially. Reverse is made to prior CT report.  IMPRESSION: Open reduction internal fixation of distal tibia and fibular fractures. Large soft tissue defect. Reference is made to prior CT report .   Electronically Signed   By: Marcello Moores  Register   On: 03/24/2015 11:49   Ct Head Wo Contrast  03/25/2015   CLINICAL DATA:  RIGHT visual problems after lower extremity external fixation.  EXAM: CT HEAD WITHOUT CONTRAST  TECHNIQUE: Contiguous axial images were obtained from the base of  the skull through the vertex without intravenous contrast.  COMPARISON:  MRI of the head April 22, 2013  FINDINGS: Focal blurring of the LEFT medial occipital lobe gray-white matter differentiation, axial 18/34. Small area of LEFT parietal lobe encephalomalacia at site of prior infarct. No intraparenchymal hemorrhage, mass effect, midline shift. The ventricles and sulci are normal for patient's age. Minimal white matter changes most consistent with chronic small vessel ischemic disease.  No abnormal extra-axial fluid collections. Basal cisterns are patent.  Ocular globes and orbital contents are unremarkable. Minimal paranasal sinus mucosal thickening without air-fluid levels. The imaged mastoid air cells are well aerated. No skull fracture.  IMPRESSION: Acute LEFT posterior cerebral artery territory infarct.  Small area LEFT parietal encephalomalacia consistent with remote ischemia. Minimal white matter changes most consistent chronic small vessel  ischemic disease.  Acute findings discussed with and reconfirmed by Apolonio Schneiders, RN on MC-5N on 03/25/2015 at 3:20 am.   Electronically Signed   By: Elon Alas M.D.   On: 03/25/2015 03:21   Ct Ankle Left Wo Contrast  03/23/2015   CLINICAL DATA:  Open fracture of the distal tibia in an external fixator.  EXAM: CT OF THE LEFT ANKLE WITHOUT CONTRAST  TECHNIQUE: Multidetector CT imaging of the left ankle was performed according to the standard protocol. Multiplanar CT image reconstructions were also generated.  COMPARISON:  Radiographs 03/22/2015  FINDINGS: Severely comminuted intra-articular fracture of the distal tibia with maximum depression at the articular surface of 5.5 mm. Much improved position and alignment in the external fixator.  Comminuted but nondisplaced fracture of the distal fibular shaft above the level of the ankle mortise. The ankle mortise is fairly well maintained. The subtalar joints are maintained.  Extensive air in the soft tissues from the open fracture. There is also air in the subtalar joints and sinus tarsi. Underlying osteopenia.  IMPRESSION: Much improved position and alignment of the comminuted distal tibia and fibular fractures in the external fixator.  Maximum depression at the articular surface of the tibia is 5.5 mm.  The subtalar joints are maintained.   Electronically Signed   By: Marijo Sanes M.D.   On: 03/23/2015 09:06   Dg Shoulder Left  03/24/2015   CLINICAL DATA:  Anterior dislocation of the left humeral head.  EXAM: LEFT SHOULDER - 2+ VIEW  COMPARISON:  Radiographs dated 03/22/2015  FINDINGS: The anterior dislocation has been reduced. Hill-Sachs impaction fracture of the posterior lateral aspect of the humeral head.  IMPRESSION: Reduction of the anterior dislocation.  Hill-Sachs lesion.   Electronically Signed   By: Lorriane Shire M.D.   On: 03/24/2015 12:53   Dg Shoulder Left  03/24/2015   CLINICAL DATA:  Recent shoulder dislocation.  EXAM: LEFT SHOULDER - 2+ VIEW   COMPARISON:  03/23/2015.  FINDINGS: On previously identified dislocated left number has been relocated. Hill-Sachs deformity cannot be excluded. No other focal abnormality.  IMPRESSION: Relocation of the left shoulder. Subtle Hill-Sachs deformity cannot be excluded.   Electronically Signed   By: Marcello Moores  Register   On: 03/24/2015 11:51   Dg Shoulder Left  03/23/2015   CLINICAL DATA:  Recent left shoulder dislocation  EXAM: LEFT SHOULDER - 2+ VIEW  COMPARISON:  03/22/2015 (multiple examinations)  FINDINGS: There is a recurrent anterior inferior dislocation of the humeral head in relation to the glenoid. No definite Hill-Sachs or bony Bankart deformity. There is mild degenerative change of the acromioclavicular joint with joint space loss, subchondral sclerosis and inferiorly directed osteophytosis. No definite evidence  of calcific tendinitis. Regional soft tissues appear normal. No radiopaque foreign body. Limited visualization adjacent thorax is normal.  IMPRESSION: Recurrent anterior inferior dislocation of the glenohumeral joint without definite evidence of fracture.  These results will be called to the ordering clinician or representative by the Radiologist Assistant, and communication documented in the PACS or zVision Dashboard.   Electronically Signed   By: Sandi Mariscal M.D.   On: 03/23/2015 12:05   Dg Ankle Left Port  03/24/2015   CLINICAL DATA:  Trauma and fixation of left ankle fractures.  EXAM: DG C-ARM 61-120 MIN; PORTABLE LEFT ANKLE - 2 VIEW  COMPARISON:  None.  FINDINGS: K-wires are now present spanning the medial and lateral malleolar lie with improved alignment noted. The talus appears intact.  IMPRESSION: Improved alignment following K-wire fixation across medial and lateral malleolar fractures.   Electronically Signed   By: Aletta Edouard M.D.   On: 03/24/2015 14:19   Dg C-arm 61-120 Min  03/24/2015   CLINICAL DATA:  Trauma and fixation of left ankle fractures.  EXAM: DG C-ARM 61-120 MIN;  PORTABLE LEFT ANKLE - 2 VIEW  COMPARISON:  None.  FINDINGS: K-wires are now present spanning the medial and lateral malleolar lie with improved alignment noted. The talus appears intact.  IMPRESSION: Improved alignment following K-wire fixation across medial and lateral malleolar fractures.   Electronically Signed   By: Aletta Edouard M.D.   On: 03/24/2015 14:19    Assessment: 64 y.o. male with hyperlipidemia,left cortical parietal infarct on 7/8/14without residual deficits, s/p loop recorder implantation, now with new onset right visual impairment and CT brain with findings suggestive of acute ischemia left medial occipital lobe, likely embolism to the left PCA. NIHSS 6 but patient had surgery 6/9, is out of the window for IV tpa, and on subsequent exam I was not able to confirm a right visual field cut.  Stroke work up. Aspirin. Stroke team will follow tomorrow.  Stroke Risk Factors - hyperlipidemia, prior stroke.  Plan: 1. HgbA1c, fasting lipid panel 2. MRI, MRA  of the brain without contrast 3. Echocardiogram 4. Carotid dopplers 5. Prophylactic therapy-aspirin 6. Risk factor modification 7. Telemetry monitoring 8. Frequent neuro checks 9. PT/OT SLP   Dorian Pod, MD [facility] [phone]  03/25/2015, 4:51 AM

## 2015-03-25 NOTE — Progress Notes (Signed)
VASCULAR LAB PRELIMINARY  PRELIMINARY  PRELIMINARY  PRELIMINARY  Bilateral lower extremity venous duplex and carotid duplex completed.    Preliminary report:   1.  Venous:  Bilateral:  No evidence of DVT, superficial thrombosis, or Baker's Cyst. 2.  Carotid:  Bilateral:  1-39% ICA stenosis.  Vertebral artery flow is antegrade.       Atiya Yera, RVT 03/25/2015, 3:45 PM

## 2015-03-25 NOTE — Significant Event (Signed)
Rapid Response Event Note :   Called by Justin Schneiders, RN with concerns over pt's mental status since returning from MRI this evening.  LSW 2000 hrs.  Pt having reported periods of confusion,and blurried vision which is improving per pt.    Overview: Time Called: 0150 Arrival Time: 0155    Initial Focused Assessment:   Upon arrival pt lying in bed, alert, oriented to self, place, circumstances, disoriented to month and year.  Speech clear but delayed,  PERL 76mm , Pt c/o blurried vision, right  Complete visual field cut.  Denies HA, vertigo or double vision.  Right facial droop which patient later  stated is due to Bell's palsy. NIHSS scored 5. See flow sheet for detailed NIHSS.   No drift on RUE OR RLE .  Unable to test Left UE and left LE due to surgery and placement of external fixator on LLE.    VS:  99.7  120/72  138  16 with O2 Sats 93% on 2l [place].     CBG 133, BIlat BS clear, diminished in bases.  HR regular and rapid rate 130, Abd soft, nontender, active bowel sounds.     Interventions:  Spoke with Dr Armida Sans at [phone]  regarding pt findings.  Formal code stroke not  called per Dr Armida Sans due to pt's recent surgery  and trauma from fall off ladder.  Pt was sent for emergent CT which indicated  a left posterior cerebral artery territory infarct  Reported to Justin Schneiders, RN 5N at [phone] .  Dr Armida Sans notified of same at 26.  Rachel notified [employer] , [place] of patient turn of events.  Neuro consult ordered.       12 lead EKG done reading ST 138 with  Non specific t-wave abnormality   Patient placed on cardiac telemetry, VS with neuro checks ordered q 2 hrs x 12 then q 4.  Pt made NPO until bedside swallow eval completed.      Hand off report to [facility],  WIll continue to follow   FU:  2297   Dr Armida Sans at bedside.  Pt's vision improving with absence of Right visual filed cut at this time and  less blurring per pt. FU:  Pt passed stroke swallow screen  Event Summary: Name of Physician Notified: Dr Carole Binning  at [phone] .  Baldo Ash , PA notified by Justin Schneiders, RN 5N at 0330    at    Outcome: Stayed in room and stabalized     Annesha Delgreco, Gust Brooms

## 2015-03-25 NOTE — Progress Notes (Signed)
OT Cancellation Note  Patient Details Name: Justin [name] MRN: [id number] DOB: 09-16-1951   Cancelled Treatment:    Reason Eval/Treat Not Completed: Medical issues which prohibited therapy. Note recent medical changes. Acute OT will hold at this time until pt is medically stable for therapy. Please update activity orders as appropriate.   [place] Herb M   Cyndie Chime, OTR/L Occupational Therapist [phone] (pager)  03/25/2015, 7:45 AM

## 2015-03-25 NOTE — Consult Note (Signed)
Orthopaedic Trauma Service Consult Note  Requesting: B. Swinteck, MD (ortho) Reason: Open L distal tib-fib fracture, L shoulder dislocation   PCP: Dr. [place] Cards: Dr. Klein  HPI  Very pleasant 64-year-old right-hand-dominant white male on his barn yesterday evening changing some lites when he fell off of his ladder. Patient does not recall the exact way that he fell however he believes that his left leg got caught between the rungs and he fell on his left shoulder. Patient had immediate onset of pain. He attempted to get up but sought deformity at his left ankle. He crawled approximately 100 yards back to his house to call [phone]. He was brought to [place] for evaluation. He was found to have an open left distal tibia fracture as well as a left shoulder dislocation. Patient was seen and evaluated by Dr. Swinteck in the emergency department and was taken urgently to the OR for closed reduction of his left shoulder as well as irrigation and debridement of his open left distal tibia fracture with application of a spanning external fixator. Given the complexity of the patient's injury it was felt that the patient would need the expertise of a fellowship trained orthopedic traumatologist for care of his injuries. As such we are consulted for management of patient's injuries.  Patient is seen on 5 N. complains only of left shoulder and left ankle pain. He does have some very mild paresthesias to the dorsum of his left foot. Soreness left shoulder. Denies any injuries to the right side of his body. Does not recall losing consciousness prior to during or after his fall. Denies any chest pain or palpitations. No abdominal pain.   Patient does have a implanted loop recorder which was placed after patient sustained a stroke approximately 2 years ago without an identifiable cause. Patient sees Dr. Klein for this.+ PFO on bubble study back in 2014. No cardiac issues since his stroke in 2014.    Patient has  been on Ancef, penicillin and gentamicin for his fracture since admission.   Case and management discussed with Dr. handy   Review of Systems  Constitutional: Negative for fever and chills.  Eyes: Negative for blurred vision.  Respiratory: Negative for shortness of breath and wheezing.   Cardiovascular: Negative for chest pain and palpitations.  Gastrointestinal: Negative for nausea, vomiting and abdominal pain.  Genitourinary: Negative for dysuria and urgency.  Musculoskeletal:       Left ankle and left shoulder pain  Neurological: Positive for tingling and sensory change. Negative for headaches.       Left foot    Past Medical History  Diagnosis Date  . Bell's palsy 1980's; 1990's; 2000's; 12/2012; 03/2013    "multiple times" (04/21/2013)  . TIA (transient ischemic attack) 04/21/2013  . Stroke   . Hypercholesteremia     Past Surgical History  Procedure Laterality Date  . Tee without cardioversion N/A 04/24/2013    Procedure: TRANSESOPHAGEAL ECHOCARDIOGRAM (TEE);  Surgeon: Philip J Nahser, MD;  Location: [facility] ENDOSCOPY;  Service: Cardiovascular;  Laterality: N/A;  . Implanted loop recorder    . Colonoscopy N/A 07/13/2013    Procedure: COLONOSCOPY;  Surgeon: Sandi L Fields, MD;  Location: [facility];  Service: Endoscopy;  Laterality: N/A;  8:30 AM  . Loop recorder implant N/A 04/24/2013    Procedure: LOOP RECORDER IMPLANT;  Surgeon: Steven C Klein, MD;  Location: [facility] CATH LAB;  Service: Cardiovascular;  Laterality: N/A;   Allergies No Known Allergies   Medications Prior to   Admission  Medication Sig Dispense Refill  . aspirin 325 MG tablet Take 325 mg by mouth daily.    . simvastatin (ZOCOR) 10 MG tablet Take 10 mg by mouth daily.    . acyclovir (ZOVIRAX) 800 MG tablet Take 1 tablet (800 mg total) by mouth 4 (four) times daily. (Patient not taking: Reported on 03/22/2015) 20 tablet 1  . aspirin 325 MG tablet Take 1 tablet (325 mg total) by mouth daily. (Patient not taking: Reported  on 03/22/2015) 30 tablet 1  . predniSONE (DELTASONE) 10 MG tablet 6 tabs on day 1, 5 tabs on day 2, 4 tabs on day 3, 3 tabs on day 4, 2 tabs on day 5, and 1 tab on day 6. (Patient not taking: Reported on 03/22/2015) 21 tablet 0  . simvastatin (ZOCOR) 10 MG tablet Take 1 tablet (10 mg total) by mouth at bedtime. (Patient not taking: Reported on 03/22/2015) 90 tablet 3  . zoster vaccine live, PF, (ZOSTAVAX) 19400 UNT/0.65ML injection Inject 19,400 Units into the skin once. (Patient not taking: Reported on 02/21/2015) 1 each 0     Family History  Problem Relation Age of Onset  . Heart disease Mother   . Alcohol abuse Mother   . Colon cancer Neg Hx    History   Social History  . Marital Status: Married    Spouse Name: N/A  . Number of Children: 3  . Years of Education: college   Occupational History  . [employer]    Social History Main Topics  . Smoking status: Never Smoker   . Smokeless tobacco: Never Used  . Alcohol Use: Yes     Comment: 03/22/2013 "glass of wine couple times/yr"  . Drug Use: No  . Sexual Activity: Yes   Other Topics Concern  . Not on file   Social History Narrative      Objective   BP 126/89 mmHg  Pulse 95  Temp(Src) 97.7 F (36.5 C) (Oral)  Resp 20  Ht 5' 9" (1.753 m)  Wt 83.915 kg (185 lb)  BMI 27.31 kg/m2  SpO2 97%  Intake/Output      06/07 0701 - 06/08 0700 06/08 0701 - 06/09 0700   I.V. (mL/kg) 1550 (18.5)    IV Piggyback 100    Total Intake(mL/kg) 1650 (19.7)    Urine (mL/kg/hr) 450    Stool 0    Blood 100    Total Output 550     Net +1100            Labs Results for Shedlock, Malaky M (MRN [id number]) as of 03/23/2015 09:19  Ref. Range 03/23/2015 05:09  Sodium Latest Ref Range: 135-145 mmol/L 141  Potassium Latest Ref Range: 3.5-5.1 mmol/L 4.1  Chloride Latest Ref Range: 101-111 mmol/L 105  CO2 Latest Ref Range: 22-32 mmol/L 26  BUN Latest Ref Range: 6-20 mg/dL 15  Creatinine Latest Ref Range: 0.61-1.24 mg/dL 1.12  Calcium Latest Ref  Range: 8.9-10.3 mg/dL 8.7 (L)  EGFR (Non-African Amer.) Latest Ref Range: >60 mL/min >60  EGFR (African American) Latest Ref Range: >60 mL/min >60  Glucose Latest Ref Range: 65-99 mg/dL 162 (H)  Anion gap Latest Ref Range: 5-15  10   Results for Scheuermann, Kroy M (MRN [id number]) as of 03/23/2015 09:19  Ref. Range 03/22/2015 20:36 03/22/2015 21:22 03/22/2015 23:00 03/23/2015 00:00 03/23/2015 05:09  Glucose Latest Ref Range: 65-99 mg/dL 171 (H)    162 (H)    Exam  Gen: Awake and alert, no acute distress,   resting comfortably in bed Lungs: Clear to auscultation bilaterally Cardiac: Regular rate and rhythm, S1 and S2 Abd: Soft, nontender, nondistended,+ bowel sounds Pelvis: no traumatic wounds or rash, no ecchymosis, stable to manual stress, nontender Ext:       Left upper extremity Inspection:   Patient in sling   No gross deformities noted in the shoulder, elbow, forearm, wrist or hand   No open wounds or lesions   No ecchymosis Bony eval:   Tender to palpation left shoulder    Clavicle, elbow, forearm, wrist and hand are nontender    no crepitus with evaluation Soft tissue:    Soft tissues unremarkable.    Soft tissue structures are on shoulder not stressed in terms of stability ROM:    Full elbow, forearm, wrist and hand motion are noted Sensation:    Radial, ulnar, median, axillary nerve sensory function intact Motor:    Radial, ulnar, median, axillary nerves motor functions intact Vascular:    Extremity is warm    Palpable radial pulse    No Significant swelling to the left upper extremity       Left lower extremity Inspection:   Patient in a delta frame external fixator   2 tibial half pins, one trans-calcaneal pin   Delta frame is augmented with a bar between the 2 peripheral bars   Dressing to the left lower leg is stable some mild oozing noted   Hip and knee are unremarkable Bony eval:    Hip and knee are nontender, no crepitus with evaluation    Distal tibia and fibular  are tender Soft tissue:    Soft Tissue around the knee and hip are unremarkable     I did not remove the dressing to evaluate his traumatic wounds as the patient was just in the OR several hours ago ROM:    Unable to assess ankle range of motion as the patient is in a spanning external fixator Sensation:    DPN, SPN and TN sensory functions are grossly intact. Some slight decrease along the DPN distribution Motor:    EHL, FHL and lesser toe motor functions are intact Vascular:   Extremity is warm   Palpable dorsalis pedis pulse   No significant swelling noted at this time   Compartments are soft and nontender, no pain out of proportion with passive stretching       Right upper extremity UEx shoulder, elbow, wrist, digits- no skin wounds, nontender, no instability, no blocks to motion  Sens  Ax/R/M/U intact  Mot   Ax/ R/ PIN/ M/ AIN/ U intact  Rad 2+       Right lower extremity            No traumatic wounds, ecchymosis, or rash  Nontender  No effusions  Knee stable to varus/ valgus and anterior/posterior stress   Hip is stable, no pain with axial loading or logrolling  Ankle is unremarkable as well. Ankle is stable with ligamentous stressing. Full range of motion is noted  Sens DPN, SPN, TN intact  Motor EHL, ext, flex, evers 5/5  DP 2+, No swelling/edema    Assessment and Plan   POD/HD#: 1  1. Grade 3 open left distal tib-fib fracture status post I&D and external fixation, closed left anterior shoulder dislocation status post reduction   Return to the OR tomorrow for repeat irrigation and debridement open left tibia injury and external fixator revision, may consider placing a flexible nail and fibula tomorrow  CT scan   pending left ankle  Would anticipate delayed fixation about 10-14 days given the magnitude of wound  Also due to the size of his wound we did discuss the possibility of early evaluation by plastic surgery given the tenuous nature of medial sided soft  tissue.  Injury films show that there is very little articular block which may make plate fixation somewhat difficult however we will again review the CT scan. It may be that patient is treated definitively in his ex-fix  Patient does remain at risk for complications such as nonunion and deep infection given the open nature of his injury.  Patient will remain on IV antibodies for now. He will remain on them until we definitively close his wound for 72 hours post definitive closure   Patient does not smoke and does not report a history of diabetes which is a favorable history and the presence of this open injury.  On review of his labs his glucose has been somewhat elevated in the 170s and 160s. Will check a hemoglobin A1c to ensure that he is not an undiagnosed diabetic. This may simply be a stress reaction due to his trauma   With respect to his left upper extremity we will proceed with MRI at this time to evaluate for any ligamentous or other structural injury. It would be optimal for the patient to be able to use his arm to weight-bear through given his left lower extremity injury.  Continue with sling for now   Ice and elevation to extremities  Elevate left leg above heart for swelling/edema control  2. Pain management:  Continue with current management  3. ABL anemia/Hemodynamics  CBC in a.m.  4. Medical issues   Stable  + PFO on bubble study back in 2014. No cardiac issues since his stroke in 2014. No identifiable cause of the stroke noted   5. DVT/PE prophylaxis:  We'll place on Lovenox after next surgery  6. ID:   Continue with Ancef, gentamicin and penicillin until further notice  7. Metabolic Bone Disease:  Check vitamin D levels  8. Activity:  Activity as tolerated  Non-Weightbearing left upper extremity and left lower extremity  Okay to transfer from bed to chair with assist  9. FEN/Foley/Lines:  Advance to regular diet  Npo after midnight  10.Ex-fix/Splint  care:  Okay to manipulate left leg by fixator  11. Dispo:  Return to the OR tomorrow for repeat irrigation and debridement of left distal tibia and fibula fractures    Celester Morgan W. Holly Pring, PA-C [facility] [phone] ([phone] (O) 03/23/2015 9:12 AM    Patient will remain on IV antibodies for now. He will remain on them until we definitively close his wound for 72 hours post definitive closure              Patient does not smoke and does not report a history of diabetes which is a favorable history and the presence of this open injury.             On review of his labs his glucose has been somewhat elevated in the 170s and 160s. Will check a hemoglobin A1c to ensure that he is not an undiagnosed diabetic. This may simply be a stress reaction due to his trauma              With respect to his left upper extremity we will proceed with MRI at this time to evaluate for any ligamentous or other structural injury. It would be optimal for the patient to be able to use his arm to weight-bear through given his left lower extremity injury.             Continue with sling for now              Ice and elevation to extremities             Elevate left leg above heart for swelling/edema control  2. Pain management:             Continue with current management  3. ABL anemia/Hemodynamics             CBC in a.m.  4. Medical issues                Stable             + PFO on bubble study back in 2014. No cardiac issues since his stroke in 2014. No identifiable cause of the stroke noted   5. DVT/PE prophylaxis:             We'll place on Lovenox after next surgery  6. ID:               Continue with Ancef, gentamicin and penicillin until further notice  7. Metabolic Bone Disease:             Check vitamin D levels  8. Activity:             Activity as tolerated             Non-Weightbearing left upper extremity and left lower extremity             Okay to transfer from bed to chair with assist  9. FEN/Foley/Lines:             Advance to regular diet             Npo after midnight  10.Ex-fix/Splint care:             Okay to manipulate left leg by fixator  11. Dispo:             Return to the OR tomorrow for repeat irrigation and debridement of left distal tibia and fibula fractures    [name], PA-C [facility] [phone] (O) 03/23/2015 9:12 AM

## 2015-03-25 NOTE — Discharge Instructions (Addendum)
Orthopaedic Trauma Service Discharge Instructions,   General Discharge Instructions  WEIGHT BEARING STATUS: Nonweightbearing Left leg, WBAT L upper extremity   RANGE OF MOTION/ACTIVITY: Gentle L shoulder ROM as tolerated  PAIN MEDICATION USE AND EXPECTATIONS  You have likely been given narcotic medications to help control your pain.  After a traumatic event that results in an fracture (broken bone) with or without surgery, it is ok to use narcotic pain medications to help control one's pain.  We understand that everyone responds to pain differently and each individual patient will be evaluated on a regular basis for the continued need for narcotic medications. Ideally, narcotic medication use should last no more than 6-8 weeks (coinciding with fracture healing).   As a patient it is your responsibility as well to monitor narcotic medication use and report the amount and frequency you use these medications when you come to your office visit.   We would also advise that if you are using narcotic medications, you should take a dose prior to therapy to maximize you participation.  IF YOU ARE ON NARCOTIC MEDICATIONS IT IS NOT PERMISSIBLE TO OPERATE A MOTOR VEHICLE (MOTORCYCLE/CAR/TRUCK/MOPED) OR HEAVY MACHINERY DO NOT MIX NARCOTICS WITH OTHER CNS (CENTRAL NERVOUS SYSTEM) DEPRESSANTS SUCH AS ALCOHOL  Diet: as you were eating previously.  Can use over the counter stool softeners and bowel preparations, such as Miralax, to help with bowel movements.  Narcotics can be constipating.  Be sure to drink plenty of fluids  Wound Care: Discharge Pin Site Instructions  Dress pins daily with Kerlix roll starting on POD 2. Wrap the Kerlix so that it tamps the skin down around the pin-skin interface to prevent/limit motion of the skin relative to the pin.  (Pin-skin motion is the primary cause of pain and infection related to external fixator pin sites).  Remove any crust or coagulum that may obstruct drainage  with a saline moistened gauze or soap and water.  After POD 3, if there is no discernable drainage on the pin site dressing, the interval for change can by increased to every other day.  You may shower with the fixator, cleaning all pin sites gently with soap and water.  If you have a surgical wound this needs to be completely dry and without drainage before showering.  The extremity can be lifted by the fixator to facilitate wound care and transfers.  Notify the office/Doctor if you experience increasing drainage, redness, or pain from a pin site, or if you notice purulent (thick, snot-like) drainage  STOP SMOKING OR USING NICOTINE PRODUCTS!!!!  As discussed nicotine severely impairs your body's ability to heal surgical and traumatic wounds but also impairs bone healing.  Wounds and bone heal by forming microscopic blood vessels (angiogenesis) and nicotine is a vasoconstrictor (essentially, shrinks blood vessels).  Therefore, if vasoconstriction occurs to these microscopic blood vessels they essentially disappear and are unable to deliver necessary nutrients to the healing tissue.  This is one modifiable factor that you can do to dramatically increase your chances of healing your injury.    (This means no smoking, no nicotine gum, patches, etc)  DO NOT USE NONSTEROIDAL ANTI-INFLAMMATORY DRUGS (NSAID'S)  Using products such as Advil (ibuprofen), Aleve (naproxen), Motrin (ibuprofen) for additional pain control during fracture healing can delay and/or prevent the healing response.  If you would like to take over the counter (OTC) medication, Tylenol (acetaminophen) is ok.  However, some narcotic medications that are given for pain control contain acetaminophen as well. Therefore, you should  not exceed more than 4000 mg of tylenol in a day if you do not have liver disease.  Also note that there are may OTC medicines, such as cold medicines and allergy medicines that my contain tylenol as well.  If you  have any questions about medications and/or interactions please ask your doctor/PA or your pharmacist.      ICE AND ELEVATE INJURED/OPERATIVE EXTREMITY  Using ice and elevating the injured extremity above your heart can help with swelling and pain control.  Icing in a pulsatile fashion, such as 20 minutes on and 20 minutes off, can be followed.    Do not place ice directly on skin. Make sure there is a barrier between to skin and the ice pack.    Using frozen items such as frozen peas works well as the conform nicely to the are that needs to be iced.  USE AN ACE WRAP OR TED HOSE FOR SWELLING CONTROL  In addition to icing and elevation, Ace wraps or TED hose are used to help limit and resolve swelling.  It is recommended to use Ace wraps or TED hose until you are informed to stop.    When using Ace Wraps start the wrapping distally (farthest away from the body) and wrap proximally (closer to the body)   Example: If you had surgery on your leg or thing and you do not have a splint on, start the ace wrap at the toes and work your way up to the thigh        If you had surgery on your upper extremity and do not have a splint on, start the ace wrap at your fingers and work your way up to the upper arm  IF YOU ARE IN A SPLINT OR CAST DO NOT [place]   If your splint gets wet for any reason please contact the office immediately. You may shower in your splint or cast as long as you keep it dry.  This can be done by wrapping in a cast cover or garbage back (or similar)  Do Not stick any thing down your splint or cast such as pencils, money, or hangers to try and scratch yourself with.  If you feel itchy take benadryl as prescribed on the bottle for itching  IF YOU ARE IN A CAM BOOT (BLACK BOOT)  You may remove boot periodically. Perform daily dressing changes as noted below.  Wash the liner of the boot regularly and wear a sock when wearing the boot. It is recommended that you sleep in the  boot until told otherwise  CALL THE OFFICE WITH ANY QUESTIONS OR CONCERTS: [phone]     Discharge Pin Site Instructions  Dress pins daily with Kerlix roll starting on POD 2. Wrap the Kerlix so that it tamps the skin down around the pin-skin interface to prevent/limit motion of the skin relative to the pin.  (Pin-skin motion is the primary cause of pain and infection related to external fixator pin sites).  Remove any crust or coagulum that may obstruct drainage with a saline moistened gauze or soap and water.  After POD 3, if there is no discernable drainage on the pin site dressing, the interval for change can by increased to every other day.  You may shower with the fixator, cleaning all pin sites gently with soap and water.  If you have a surgical wound this needs to be completely dry and without drainage before showering.  The extremity can be  lifted by the fixator to facilitate wound care and transfers.  Notify the office/Doctor if you experience increasing drainage, redness, or pain from a pin site, or if you notice purulent (thick, snot-like) drainage.  Discharge Wound Care Instructions  Do NOT apply any ointments, solutions or lotions to pin sites or surgical wounds.  These prevent needed drainage and even though solutions like hydrogen peroxide kill bacteria, they also damage cells lining the pin sites that help fight infection.  Applying lotions or ointments can keep the wounds moist and can cause them to breakdown and open up as well. This can increase the risk for infection. When in doubt call the office.  Surgical incisions should be dressed daily.  If any drainage is noted, use one layer of adaptic, then gauze, Kerlix, and an ace wrap.  Once the incision is completely dry and without drainage, it may be left open to air out.  Showering may begin 36-48 hours later.  Cleaning gently with soap and water.  Traumatic wounds should be dressed daily as well.    One layer of  adaptic, gauze, Kerlix, then ace wrap.  The adaptic can be discontinued once the draining has ceased    If you have a wet to dry dressing: wet the gauze with saline the squeeze as much saline out so the gauze is moist (not soaking wet), place moistened gauze over wound, then place a dry gauze over the moist one, followed by Kerlix wrap, then ace wrap.

## 2015-03-25 NOTE — Progress Notes (Signed)
Loop recorder device interrogation requested since pt had CVA after surgery.   Device interrogated and pt had no atrial fibrillation or atrial flutter. There were multiple episodes of tachycardia over the last 2 days reviewed. All are sinus in appearance.  MD to review, but do not currently think arrhythmia is contributing to the patient's condition.  Rosaria Ferries, PA-C 03/25/2015 2:00 PM Beeper [phone]

## 2015-03-25 NOTE — Progress Notes (Signed)
03/25/2015 patient transfer from [place] to 2 central at 1122. He is alert, oriented and had a left  open  tib-fib fracture and he have a wound vac. Patient have  Bloody drainage, noted. And he also have a close left anterior dislocation and wearing a sling. Patient is non weight bearing left shoulder and left leg. Patient sacrum his fine. Have a foam dressing on left arm where he have a skintear with steri strip and foam dressing. [facility] RN.

## 2015-03-25 NOTE — Progress Notes (Signed)
Pt arrived back to unit from MRI at [facility], pt seemed lethargic but easily arousable, VSS, pain level managed, pt disoriented to situation but oriented otherwise. Full assessment performed, pt with no current complaints, fell back to sleep when RN would leave room but easily aroused when entering back into the room.  RN and NT entered room to change bed linens, at 0130 pt was exhibiting delayed but clear verbal responses. RN attempted to educate pt on use of IS, pt was unsuccessful when attempted to place mouth on mouthpiece, instead pts mouth went above the mouthpiece. RN further assessed and asked the patient if he could see the blue bar on the IS. He stated that no he could not see anything on the IS and was having some blurry vision when he looked about the room. At this time rapid response called and arrived. See rapid note. Received orders from neuro MD for a CT of the head. Orders carried out. Received call from radiologist confirming that pt is currently having an active stroke. Ortho PA on call notified, received orders for a STAT neuro consult. Nursing will continue to monitor.

## 2015-03-25 NOTE — Progress Notes (Signed)
PT Cancellation Note  Patient Details Name: Justin [name] MRN: [id number] DOB: 03/14/1951   Cancelled Treatment:    Reason Eval/Treat Not Completed: Medical issues which prohibited therapy. Noted recent medical changes of CVA. Please update activity orders as appropriate and cleared for activity by nuerology.    Jacqualyn Posey 03/25/2015, 7:45 AM

## 2015-03-25 NOTE — Progress Notes (Signed)
03/25/2015 when patient came back from his procedures, he was disoriented and did not remember being in the room today on 2central. RN reoriented patient and eventually he started remembering. [facility] RN.

## 2015-03-25 NOTE — Progress Notes (Signed)
STROKE TEAM PROGRESS NOTE   SUBJECTIVE (INTERVAL HISTORY) His wife is at the bedside.  Overall he feels his condition is unchanged. He stated that his vision was good when he fall off ladder and his vision was good after the fall. His vision change is definitely new. He also started cough yesterday. His TEE 2 years ago showed PFO. He was admitted on 6/7 for left shoulder and left leg injury s/p left ankle external fixation.    OBJECTIVE Temp:  [98.3 F (36.8 C)-100.4 F (38 C)] 98.3 F (36.8 C) (06/10 1116) Pulse Rate:  [118-143] 120 (06/10 1116) Cardiac Rhythm:  [-] Sinus tachycardia (06/10 1122) Resp:  [16-20] 19 (06/10 1116) BP: (98-142)/(67-82) 142/82 mmHg (06/10 1116) SpO2:  [91 %-100 %] 91 % (06/10 1116) Weight:  [198 lb 3.1 oz (89.9 kg)] 198 lb 3.1 oz (89.9 kg) (06/10 1116)   Recent Labs Lab 03/25/15 0228  GLUCAP 133*    Recent Labs Lab 03/22/15 2036 03/23/15 0509 03/24/15 0530 03/24/15 0950 03/24/15 1030  NA 141 141 135 138 137  K 3.9 4.1 4.1 3.2* 3.9  CL 111 105 102  --   --   CO2 20* 26 25  --   --   GLUCOSE 171* 162* 135* 127*  --   BUN 18 15 12   --   --   CREATININE 1.20 1.12 1.09  --   --   CALCIUM 9.4 8.7* 8.1*  --   --     Recent Labs Lab 03/22/15 2036 03/24/15 0530  AST 29 27  ALT 35 18  ALKPHOS 69 49  BILITOT 0.5 0.7  PROT 6.8 5.3*  ALBUMIN 4.2 3.1*    Recent Labs Lab 03/22/15 2036 03/24/15 0530 03/24/15 0950 03/24/15 1030 03/25/15 0815  WBC 13.7* 13.9*  --   --  10.3  HGB 14.6 11.2* 11.6* 10.2* 8.7*  HCT 43.8 33.9* 34.0* 30.0* 26.2*  MCV 85.7 88.1  --   --  86.8  PLT 225 190  --   --  142*   No results for input(s): CKTOTAL, CKMB, CKMBINDEX, TROPONINI in the last 168 hours.  Recent Labs  03/22/15 2036 03/24/15 0530  LABPROT 13.9 15.8*  INR 1.05 1.25   No results for input(s): COLORURINE, LABSPEC, PHURINE, GLUCOSEU, HGBUR, BILIRUBINUR, KETONESUR, PROTEINUR, UROBILINOGEN, NITRITE, LEUKOCYTESUR in the last 72 hours.  Invalid  input(s): APPERANCEUR     Component Value Date/Time   CHOL 144 11/22/2014 0800   TRIG 56 11/22/2014 0800   HDL 45 11/22/2014 0800   CHOLHDL 3.2 11/22/2014 0800   VLDL 11 11/22/2014 0800   LDLCALC 88 11/22/2014 0800   Lab Results  Component Value Date   HGBA1C 5.9* 03/23/2015      Component Value Date/Time   LABOPIA NONE DETECTED 04/21/2013 1402   COCAINSCRNUR NONE DETECTED 04/21/2013 1402   LABBENZ NONE DETECTED 04/21/2013 1402   AMPHETMU NONE DETECTED 04/21/2013 1402   THCU NONE DETECTED 04/21/2013 1402   LABBARB NONE DETECTED 04/21/2013 1402     Recent Labs Lab 03/22/15 2036  ETH <5    I have personally reviewed the radiological images below and agree with the radiology interpretations.  Ct Head Wo Contrast  03/25/2015   IMPRESSION: Acute LEFT posterior cerebral artery territory infarct.  Small area LEFT parietal encephalomalacia consistent with remote ischemia. Minimal white matter changes most consistent chronic small vessel ischemic disease.    MRI and MRA - pending  Carotid Doppler  Pending  LE venous doppler - no  DVT  MRV pelvis - pending  2D Echocardiogram  - Left ventricle: The cavity size was normal. Wall thickness was increased in a pattern of mild LVH. The estimated ejection fraction was 65%. Wall motion was normal; there were no regional wall motion abnormalities. - Right ventricle: The cavity size was mildly decreased. Systolic function was mildly reduced. - Pulmonary arteries: PA peak pressure: 43 mm Hg (S). - Pericardium, extracardiac: A trivial pericardial effusion was identified posterior to the heart. - Impressions: No cardiac source of embolism was identified, but cannot be ruled out on the basis of this examination. Impressions: - No cardiac source of embolism was identified, but cannot be ruled out on the basis of this examination.  EKG  sinus tachycardia. For complete results please see formal report.  PHYSICAL  EXAM  Temp:  [98.3 F (36.8 C)-100.4 F (38 C)] 98.3 F (36.8 C) (06/10 1116) Pulse Rate:  [118-143] 120 (06/10 1116) Resp:  [16-20] 19 (06/10 1116) BP: (98-142)/(67-82) 142/82 mmHg (06/10 1116) SpO2:  [91 %-100 %] 91 % (06/10 1116) Weight:  [198 lb 3.1 oz (89.9 kg)] 198 lb 3.1 oz (89.9 kg) (06/10 1116)  General - Well nourished, well developed, in no apparent distress.  Ophthalmologic - fundi not visualized due to noncooperation.  Cardiovascular - Regular rate and rhythm.  Mental Status -  Level of arousal and orientation to time, place, and person were intact. Language including expression, naming, repetition, comprehension was assessed and found intact. Attention span and concentration were impaired and not able to back spelling "WORLD". Fund of Knowledge was assessed and was intact.  Cranial Nerves II - XII - II - Visual field test showed right lower quandranopia. III, IV, VI - Extraocular movements intact. V - Facial sensation intact bilaterally. VII - Facial movement intact bilaterally, but mild right nasolabial fold flattening. VIII - Hearing & vestibular intact bilaterally. X - Palate elevates symmetrically. XI - Chin turning & shoulder shrug intact bilaterally. XII - Tongue protrusion intact.  Motor Strength - The patient's strength was normal in RUE and RLE, however, difficult to check LUE and LLE due to injury and shoulder stripe and external fixator. Bulk was normal and fasciculations were absent.   Motor Tone - Muscle tone was assessed at the neck and appendages and was normal.  Reflexes - The patient's reflexes were symmetrical in all extremities and he had no pathological reflexes.  Sensory - Light touch, temperature/pinprick were assessed and were symmetrical.    Coordination - The patient had normal movements in the right hands with no ataxia or dysmetria.  Tremor was absent.  Gait and Station - not able to test.   ASSESSMENT/PLAN Mr. Justin [name]  is a 64 y.o. male with history of HLD, bell's palsy, left embolic stroke in 0814 followed up with Dr. Leonie Man s/p loop recorder admitted for fall from ladder and injury of left shoulder and LLE s/p external fixation. Found to have visual field deficit last night and CT showed left occipital infarct. Symptoms unchanged.    Stroke:  Bilateral posterior and anterior infarct, cardioembolic pattern secondary to unknown source. However, highly suspicious for DVT due to positive PFO detected in the past, recent LE surgery and cough started yesterday. So far LE venous doppler negative, will do MRV pelvis. Loop recorder so far no afib. Hypercoagulable work up pending.   MRI  Bilateral posterior and anterior infarcts, cardioembolic  MRA  negative  Carotid Doppler  pending  2D Echo  Unremarkable  LE venous  doppler - no DVT  MRV pelvis - pending  LDL pending  HgbA1c pending  Hypercoagulable and autoimmune work up pending  Loop recorder so far no afib  lovenox for VTE prophylaxis  Diet NPO time specified   aspirin 81 mg orally every day prior to admission, now on aspirin 81 mg orally every day.   Patient counseled to be compliant with his antithrombotic medications  Ongoing aggressive stroke risk factor management  Hypertension  Home meds:   none Permissive hypertension (OK if <220/120) for 24-48 hours post stroke and then gradually normalized within 5-7 days. Currently on   Stable  Hyperlipidemia  Home meds:  zocor 10mg    Currently statin resumed  LDL pending, goal < 70  Continue statin at discharge  Other Stroke Risk Factors  Hx stroke/TIA - 04/2013 left MCA/PCA and left parietal infarcts, embolic pattern, TEE positive for PFO and LE venous doppler negative. Loop recorder placed and no afib so far. On home ASA  Other Active Problems  Grade 3 open left distal tib-fib fracture status post I&D and external fixation revision, closed left anterior shoulder dislocation status  post re-reduction  Other Pertinent History  Anemia  leukocystosis  Hospital day # 3   Rosalin Hawking, MD PhD Stroke Neurology 03/25/2015 1:36 PM    To contact Stroke Continuity provider, please refer to [email or web address] After hours, contact General Neurology

## 2015-03-26 DIAGNOSIS — I6340 Cerebral infarction due to embolism of unspecified cerebral artery: Secondary | ICD-10-CM

## 2015-03-26 LAB — LIPID PANEL
CHOL/HDL RATIO: 2.7 ratio
Cholesterol: 85 mg/dL (ref 0–200)
HDL: 31 mg/dL — AB (ref >40)
LDL CALC: 39 mg/dL (ref 0–99)
Triglycerides: 75 mg/dL (ref <150)
VLDL: 15 mg/dL (ref 0–40)

## 2015-03-26 LAB — CBC
HEMATOCRIT: 27.4 % — AB (ref 39.0–52.0)
Hemoglobin: 9 g/dL — ABNORMAL LOW (ref 13.0–17.0)
MCH: 28.3 pg (ref 26.0–34.0)
MCHC: 32.8 g/dL (ref 30.0–36.0)
MCV: 86.2 fL (ref 78.0–100.0)
PLATELETS: 160 10*3/uL (ref 150–400)
RBC: 3.18 MIL/uL — ABNORMAL LOW (ref 4.22–5.81)
RDW: 13.7 % (ref 11.5–15.5)
WBC: 7.7 10*3/uL (ref 4.0–10.5)

## 2015-03-26 LAB — HEMOGLOBIN A1C
Hgb A1c MFr Bld: 5.9 % — ABNORMAL HIGH (ref 4.8–5.6)
Mean Plasma Glucose: 123 mg/dL

## 2015-03-26 LAB — ANTI-DNA ANTIBODY, DOUBLE-STRANDED: ds DNA Ab: <1 IU/mL (ref 0–9)

## 2015-03-26 MED ORDER — CLOPIDOGREL BISULFATE 75 MG PO TABS
75.0000 mg | ORAL_TABLET | Freq: Every day | ORAL | Status: DC
  Administered 2015-03-26 – 2015-03-31 (×6): 75 mg via ORAL
  Filled 2015-03-26 (×6): qty 1

## 2015-03-26 NOTE — Evaluation (Signed)
Clinical/Bedside Swallow Evaluation Patient Details  Name: Justin [name] MRN: [id number] Date of Birth: 1951-01-12  Today's Date: 03/26/2015 Time: SLP Start Time (ACUTE ONLY): 0347 SLP Stop Time (ACUTE ONLY): 0856 SLP Time Calculation (min) (ACUTE ONLY): 14 min  Past Medical History:  Past Medical History  Diagnosis Date  . Bell's palsy 1980's; [id number]; 2000's; 12/2012; 03/2013    "multiple times" (04/21/2013)  . TIA (transient ischemic attack) 04/21/2013  . Stroke   . Hypercholesteremia    Past Surgical History:  Past Surgical History  Procedure Laterality Date  . Tee without cardioversion N/A 04/24/2013    Procedure: TRANSESOPHAGEAL ECHOCARDIOGRAM (TEE);  Surgeon: Thayer Headings, MD;  Location: [place];  Service: Cardiovascular;  Laterality: N/A;  . Implanted loop recorder    . Colonoscopy N/A 07/13/2013    Procedure: COLONOSCOPY;  Surgeon: Danie Binder, MD;  [facility];  Service: Endoscopy;  Laterality: N/A;  8:30 AM  . Loop recorder implant N/A 04/24/2013    Procedure: LOOP RECORDER IMPLANT;  Surgeon: Deboraha Sprang, MD;  Location: [facility] CATH LAB;  Service: Cardiovascular;  Laterality: N/A;  . Shoulder closed reduction Left 03/22/2015    Procedure: CLOSED REDUCTION SHOULDER;  Surgeon: Rod Can, MD;  Location: [place];  Service: Orthopedics;  Laterality: Left;  . External fixation leg Left 03/22/2015    Procedure: EXTERNAL FIXATION left ankle;  Surgeon: Rod Can, MD;  Location: [place];  Service: Orthopedics;  Laterality: Left;  . I&d extremity Left 03/22/2015    Procedure: IRRIGATION AND DEBRIDEMENT OPEN TIBIA -FIBULA  FRACTURE;  Surgeon: Rod Can, MD;  Location: [place];  Service: Orthopedics;  Laterality: Left;  . External fixation leg Left 03/24/2015    Procedure: IRRIGATION AND DEBRIDEMENT  EXTERNAL FIXATURE REVISION LEFT ANKLE ;  Surgeon: Altamese Curtiss, MD;  Location: [place];  Service: Orthopedics;  Laterality: Left;   HPI:  Justin [name] is an 64 y.o.  male with a past medical history significant for hypercholesterolemia, TIA, left cortical parietal infarct on 7/8/14without residual deficits, s/p loop recorder implantation, Bell's palsy, who fell off a ladder in his barn with resulting left grade 3A open left pilon, fibula and tibia and left shoulder dislocation for which he underwent surgery on 6/7 and 6/9.   Assessment / Plan / Recommendation Clinical Impression  Pts swallow appearing within functional limits. No overt signs of aspiration with any consistency trialed. Pt reports no decreased sensation or weakness despite acute left sided occipital infarct. Recommend initiate regular thin diet. Medicines whole with thin liquid. No further ST intervention indicated at this time.     Aspiration Risk  Mild    Diet Recommendation Thin;Age appropriate regular solids   Medication Administration: Whole meds with liquid Compensations: Small sips/bites    Other  Recommendations Oral Care Recommendations: Oral care BID   Follow Up Recommendations       Frequency and Duration        Pertinent Vitals/Pain Denies pain    SLP Swallow Goals     Swallow Study Prior Functional Status       General Date of Onset: 03/22/15 Other Pertinent Information: Justin [name] is an 64 y.o. male with a past medical history significant for hypercholesterolemia, TIA, left cortical parietal infarct on 7/8/14without residual deficits, s/p loop recorder implantation, Bell's palsy, who fell off a ladder in his barn with resulting left grade 3A open left pilon, fibula and tibia and left shoulder dislocation for which he underwent surgery on 6/7  and 6/9. Type of Study: Bedside swallow evaluation Diet Prior to this Study: NPO Temperature Spikes Noted: No Respiratory Status: Supplemental O2 delivered via (comment) History of Recent Intubation: No Behavior/Cognition: Alert;Pleasant mood;Cooperative Oral Cavity - Dentition: Adequate natural dentition/normal for  age Self-Feeding Abilities: Able to feed self Patient Positioning: Upright in bed Baseline Vocal Quality: Normal Volitional Cough: Strong Volitional Swallow: Able to elicit    Oral/Motor/Sensory Function Overall Oral Motor/Sensory Function: Appears within functional limits for tasks assessed Labial ROM: Within Functional Limits Labial Symmetry: Within Functional Limits Labial Strength: Within Functional Limits Lingual ROM: Within Functional Limits Lingual Symmetry: Within Functional Limits Lingual Strength: Within Functional Limits Facial ROM: Within Functional Limits Facial Symmetry: Within Functional Limits Facial Strength: Within Functional Limits   Ice Chips Ice chips: Within functional limits Presentation: Self Fed   Thin Liquid Thin Liquid: Within functional limits Presentation: Cup;Straw;Self Fed    Nectar Thick Nectar Thick Liquid: Not tested   Honey Thick Honey Thick Liquid: Not tested   Puree Puree: Within functional limits Presentation: Self Fed   Solid   GO    Solid: Within functional limits Presentation: Self Fed      Arvil Chaco MA, [place], [place] 03/26/2015,9:01 AM

## 2015-03-26 NOTE — Progress Notes (Signed)
Subjective: 2 Days Post-Op Procedure(s) (LRB): IRRIGATION AND DEBRIDEMENT  EXTERNAL FIXATURE REVISION LEFT ANKLE  (Left) Patient reports pain as mild.  Patient states vision much improved.  No numbness/tinling or increased weakness LUE/LLE since stroke.  No chest pain/sob, nausea/vomiting, lightheadedness/dizziness.  Negative flatus/bm.  Objective: Vital signs in last 24 hours: Temp:  [98 F (36.7 C)-98.9 F (37.2 C)] 98.7 F (37.1 C) (06/11 0833) Pulse Rate:  [94-123] 105 (06/11 0833) Resp:  [16-31] 24 (06/11 0833) BP: (109-145)/(72-97) 142/90 mmHg (06/11 0833) SpO2:  [91 %-100 %] 99 % (06/11 0833) Weight:  [89.1 kg (196 lb 6.9 oz)-89.9 kg (198 lb 3.1 oz)] 89.1 kg (196 lb 6.9 oz) (06/11 0422)  Intake/Output from previous day: 06/10 0701 - 06/11 0700 In: 431 [P.O.:120; IV Piggyback:311] Out: 1355 [Urine:1350; Drains:5] Intake/Output this shift:     Recent Labs  03/24/15 0530 03/24/15 0950 03/24/15 1030 03/25/15 0815 03/26/15 0250  HGB 11.2* 11.6* 10.2* 8.7* 9.0*    Recent Labs  03/25/15 0815 03/26/15 0250  WBC 10.3 7.7  RBC 3.02* 3.18*  HCT 26.2* 27.4*  PLT 142* 160    Recent Labs  03/24/15 0530 03/24/15 0950 03/24/15 1030  NA 135 138 137  K 4.1 3.2* 3.9  CL 102  --   --   CO2 25  --   --   BUN 12  --   --   CREATININE 1.09  --   --   GLUCOSE 135* 127*  --   CALCIUM 8.1*  --   --     Recent Labs  03/24/15 0530  INR 1.25    Neurologically intact Neurovascular intact Sensation intact distally Intact pulses distally Dorsiflexion/Plantar flexion intact Compartment soft  LUE-moderate swelling to hand.  Intact pulses.  Abduction pillow and sling in place. LLE-ex-fix intact.  No drainage noted through dressing.  Wound vac functioning properly. EHL/FHL intact.  Minimal swelling  Assessment/Plan: 2 Days Post-Op Procedure(s) (LRB): IRRIGATION AND DEBRIDEMENT  EXTERNAL FIXATURE REVISION LEFT ANKLE  (Left) Advance diet pending swallow study and  recommendation by SLP LUE-NWB.  Abduction pillow and sling at all times LLE-NWB,  But ok to transfer from bed to chair with assist.  Continue wound vac.  pinsite dressing change prn.  Elevate and ice often. Bilateral venous doppler negative for DVT ABLA-stable Continue ABX and DVT PPX Continue plan per neuro Continue plan per cardiology    [name] 03/26/2015, 9:10 AM

## 2015-03-26 NOTE — Progress Notes (Signed)
PT Cancellation Note  Patient Details Name: Justin [name] MRN: [id number] DOB: [birth year]   Cancelled Treatment:    Reason Eval/Treat Not Completed: Medical issues which prohibited therapy.  Attempted to see patient.  Noted resting HR 121.  RN notified - will hold PT today.  Will return tomorrow for PT session as appropriate.   Despina Pole 03/26/2015, 2:14 PM Carita Pian. Sanjuana Kava, [place] Pager [phone]

## 2015-03-26 NOTE — Progress Notes (Signed)
STROKE TEAM PROGRESS NOTE   SUBJECTIVE (INTERVAL HISTORY) His wife is at the bedside.  Overall he feels his condition is unchanged. He stated that his vision was good when he fall off ladder and his vision was good after the fall. His vision change is definitely new.   His TEE 2 years ago showed PFO. He was admitted on 6/7 for left shoulder and left leg injury s/p left ankle external fixation. He had transthoracic echo done yesterday which was normal and lower extremity venous Doppler does not show any evidence of DVT   OBJECTIVE Temp:  [98 F (36.7 C)-98.9 F (37.2 C)] 98.8 F (37.1 C) (06/11 1141) Pulse Rate:  [94-123] 118 (06/11 1141) Cardiac Rhythm:  [-] Sinus tachycardia (06/11 1000) Resp:  [16-31] 20 (06/11 1141) BP: (109-145)/(72-97) 132/72 mmHg (06/11 1141) SpO2:  [92 %-100 %] 97 % (06/11 1141) Weight:  [196 lb 6.9 oz (89.1 kg)] 196 lb 6.9 oz (89.1 kg) (06/11 0422)   Recent Labs Lab 03/25/15 0228  GLUCAP 133*    Recent Labs Lab 03/22/15 2036 03/23/15 0509 03/24/15 0530 03/24/15 0950 03/24/15 1030  NA 141 141 135 138 137  K 3.9 4.1 4.1 3.2* 3.9  CL 111 105 102  --   --   CO2 20* 26 25  --   --   GLUCOSE 171* 162* 135* 127*  --   BUN 18 15 12   --   --   CREATININE 1.20 1.12 1.09  --   --   CALCIUM 9.4 8.7* 8.1*  --   --     Recent Labs Lab 03/22/15 2036 03/24/15 0530  AST 29 27  ALT 35 18  ALKPHOS 69 49  BILITOT 0.5 0.7  PROT 6.8 5.3*  ALBUMIN 4.2 3.1*    Recent Labs Lab 03/22/15 2036 03/24/15 0530 03/24/15 0950 03/24/15 1030 03/25/15 0815 03/26/15 0250  WBC 13.7* 13.9*  --   --  10.3 7.7  HGB 14.6 11.2* 11.6* 10.2* 8.7* 9.0*  HCT 43.8 33.9* 34.0* 30.0* 26.2* 27.4*  MCV 85.7 88.1  --   --  86.8 86.2  PLT 225 190  --   --  142* 160   No results for input(s): CKTOTAL, CKMB, CKMBINDEX, TROPONINI in the last 168 hours.  Recent Labs  03/24/15 0530  LABPROT 15.8*  INR 1.25   No results for input(s): COLORURINE, LABSPEC, PHURINE, GLUCOSEU,  HGBUR, BILIRUBINUR, KETONESUR, PROTEINUR, UROBILINOGEN, NITRITE, LEUKOCYTESUR in the last 72 hours.  Invalid input(s): APPERANCEUR     Component Value Date/Time   CHOL 85 03/26/2015 0250   TRIG 75 03/26/2015 0250   HDL 31* 03/26/2015 0250   CHOLHDL 2.7 03/26/2015 0250   VLDL 15 03/26/2015 0250   LDLCALC 39 03/26/2015 0250   Lab Results  Component Value Date   HGBA1C 5.9* 03/25/2015      Component Value Date/Time   LABOPIA NONE DETECTED 04/21/2013 1402   COCAINSCRNUR NONE DETECTED 04/21/2013 1402   LABBENZ NONE DETECTED 04/21/2013 1402   AMPHETMU NONE DETECTED 04/21/2013 1402   THCU NONE DETECTED 04/21/2013 1402   LABBARB NONE DETECTED 04/21/2013 1402     Recent Labs Lab 03/22/15 2036  ETH <5    I have personally reviewed the radiological images below and agree with the radiology interpretations.  Ct Head Wo Contrast  03/25/2015   IMPRESSION: Acute LEFT posterior cerebral artery territory infarct.  Small area LEFT parietal encephalomalacia consistent with remote ischemia. Minimal white matter changes most consistent chronic small vessel ischemic  disease.    MRI and MRA - pending  Carotid Doppler  Pending  LE venous doppler - no DVT  MRV pelvis -preliminary report by my reading no evidence of DVT  2D Echocardiogram  - Left ventricle: The cavity size was normal. Wall thickness was increased in a pattern of mild LVH. The estimated ejection fraction was 65%. Wall motion was normal; there were no regional wall motion abnormalities. - Right ventricle: The cavity size was mildly decreased. Systolic function was mildly reduced. - Pulmonary arteries: PA peak pressure: 43 mm Hg (S). - Pericardium, extracardiac: A trivial pericardial effusion was identified posterior to the heart. - Impressions: No cardiac source of embolism was identified, but cannot be ruled out on the basis of this examination. Impressions: - No cardiac source of embolism was identified, but  cannot be ruled out on the basis of this examination.  EKG  sinus tachycardia. For complete results please see formal report.  PHYSICAL EXAM  Temp:  [98 F (36.7 C)-98.9 F (37.2 C)] 98.8 F (37.1 C) (06/11 1141) Pulse Rate:  [94-123] 118 (06/11 1141) Resp:  [16-31] 20 (06/11 1141) BP: (109-145)/(72-97) 132/72 mmHg (06/11 1141) SpO2:  [92 %-100 %] 97 % (06/11 1141) Weight:  [196 lb 6.9 oz (89.1 kg)] 196 lb 6.9 oz (89.1 kg) (06/11 0422)  General - Well nourished, well developed middle aged Caucasian male, in no apparent distress.  Ophthalmologic - fundi not visualized due to noncooperation.  Cardiovascular - Regular rate and rhythm.  Mental Status -  Level of arousal and orientation to time, place, and person were intact. Language including expression, naming, repetition, comprehension was assessed and found intact. Attention span and concentration were  diminished. Recall 1/3 only.   Fund of Knowledge was assessed and was intact.  Cranial Nerves II - XII - II - Visual field test showed right lower quandrantonopia. III, IV, VI - Extraocular movements intact. V - Facial sensation intact bilaterally. VII - Facial movement intact bilaterally, but mild right nasolabial fold flattening. VIII - Hearing & vestibular intact bilaterally. X - Palate elevates symmetrically. XI - Chin turning & shoulder shrug intact bilaterally. XII - Tongue protrusion intact.  Motor Strength - The patient's strength was normal in RUE and RLE, however, difficult to check LUE and LLE due to injury and shoulder stripe and external fixator. Bulk was normal and fasciculations were absent.   Motor Tone - Muscle tone was assessed at the neck and appendages and was normal.  Reflexes - The patient's reflexes were symmetrical in all extremities and he had no pathological reflexes.  Sensory - Light touch, temperature/pinprick were assessed and were symmetrical.    Coordination - The patient had normal  movements in the right hands with no ataxia or dysmetria.  Tremor was absent.  Gait and Station - not able to test.   ASSESSMENT/PLAN Justin [name] is a 64 y.o. male with history of HLD, bell's palsy, left embolic stroke in 8250  s/p loop recorder admitted for fall from ladder and injury of left shoulder and LLE s/p external fixation. Found to have visual field deficit last night and CT showed left occipital infarct. Symptoms unchanged.     Stroke:  Bilateral posterior and anterior infarct, cardioembolic pattern secondary to unknown source.   MRI  Bilateral posterior and anterior infarcts, cardioembolic  MRA  negative  Carotidno extracranial stenosis  2D Echo  Unremarkable  LE venous doppler - no DVT  MRV pelvis negative  LDL 39  HgbA1c 5.9  Hypercoagulable and autoimmune work up pending  Loop recorder so far no afib  lovenox for VTE prophylaxis  Diet regular Room service appropriate?: Yes; Fluid consistency:: Thin   aspirin 81 mg orally every day prior to admission, now on aspirin 81 mg orally every day.   Patient counseled to be compliant with his antithrombotic medications  Ongoing aggressive stroke risk factor management  Hypertension  Home meds:   none Permissive hypertension (OK if <220/120) for 24-48 hours post stroke and then gradually normalized within 5-7 days. Currently on   Stable  Hyperlipidemia  Home meds:  zocor 10mg    Currently statin resumed  LDL 39  Continue statin at discharge  Other Stroke Risk Factors  Hx stroke/TIA - 04/2013 left MCA/PCA and left parietal infarcts, embolic pattern, TEE positive for PFO and LE venous doppler negative. Loop recorder placed and no afib so far. On home ASA  Other Active Problems  Grade 3 open left distal tib-fib fracture status post I&D and external fixation revision, closed left anterior shoulder dislocation status post re-reduction  Other Pertinent  History  Anemia  leukocystosis  Hospital day # 4  I had a long discussion with the patient and his wife regarding now his second embolic stroke which is of cryptogenic etiology and atrial fibrillation has not been found on his loop recorder. He does have a PFO and I discussed the recent FDA panel decision for the Amplatzer PFO closure device showing overall risk-benefit is in favor of using the device for closure. There is still no formal FDA approval for the device but perhaps the patient may qualify for compassionate use. We will like him to heal from his current fractures and may consider this electively after 2-3 months. He does remain at risk for recurrent strokes and TIAs and needs ongoing aggressive risk factor control. change aspirin to Plavix for secondary stroke prevention.I spent a total of  35 Minutes in face to face in clinical consultation, greater than 50% of which was counseling/coordinating care    Antony Contras, MD Stroke Neurology 03/26/2015 12:08 PM    To contact Stroke Continuity provider, please refer to [email or web address] After hours, contact General Neurology

## 2015-03-27 LAB — CBC
HEMATOCRIT: 29.9 % — AB (ref 39.0–52.0)
Hemoglobin: 9.9 g/dL — ABNORMAL LOW (ref 13.0–17.0)
MCH: 28.4 pg (ref 26.0–34.0)
MCHC: 33.1 g/dL (ref 30.0–36.0)
MCV: 85.9 fL (ref 78.0–100.0)
Platelets: 207 10*3/uL (ref 150–400)
RBC: 3.48 MIL/uL — AB (ref 4.22–5.81)
RDW: 13.8 % (ref 11.5–15.5)
WBC: 6.5 10*3/uL (ref 4.0–10.5)

## 2015-03-27 LAB — BETA-2-GLYCOPROTEIN I ABS, IGG/M/A
Beta-2-Glycoprotein I IgA: <9 GPI IgA units (ref 0–25)
Beta-2-Glycoprotein I IgM: <9 GPI IgM units (ref 0–32)

## 2015-03-27 LAB — ANTINUCLEAR ANTIBODIES, IFA: ANA Ab, IFA: NEGATIVE

## 2015-03-27 LAB — CARDIOLIPIN ANTIBODIES, IGG, IGM, IGA
Anticardiolipin IgA: <9 APL U/mL (ref 0–11)
Anticardiolipin IgG: <9 GPL U/mL (ref 0–14)
Anticardiolipin IgM: <9 MPL U/mL (ref 0–12)

## 2015-03-27 LAB — HOMOCYSTEINE: Homocysteine: 7.4 umol/L (ref 0.0–15.0)

## 2015-03-27 NOTE — Progress Notes (Signed)
STROKE TEAM PROGRESS NOTE   SUBJECTIVE (INTERVAL HISTORY) His wife is not at the bedside.  Overall he feels his condition is unchanged. He is stable and foot and hand pain is tolerable    OBJECTIVE Temp:  [98.8 F (37.1 C)-99.2 F (37.3 C)] 98.8 F (37.1 C) (06/12 0738) Pulse Rate:  [85-122] 100 (06/12 0738) Cardiac Rhythm:  [-] Sinus tachycardia (06/11 2000) Resp:  [19-31] 24 (06/12 0738) BP: (123-163)/(68-91) 123/68 mmHg (06/12 0738) SpO2:  [96 %-100 %] 96 % (06/12 0738)   Recent Labs Lab 03/25/15 0228  GLUCAP 133*    Recent Labs Lab 03/22/15 2036 03/23/15 0509 03/24/15 0530 03/24/15 0950 03/24/15 1030  NA 141 141 135 138 137  K 3.9 4.1 4.1 3.2* 3.9  CL 111 105 102  --   --   CO2 20* 26 25  --   --   GLUCOSE 171* 162* 135* 127*  --   BUN 18 15 12   --   --   CREATININE 1.20 1.12 1.09  --   --   CALCIUM 9.4 8.7* 8.1*  --   --     Recent Labs Lab 03/22/15 2036 03/24/15 0530  AST 29 27  ALT 35 18  ALKPHOS 69 49  BILITOT 0.5 0.7  PROT 6.8 5.3*  ALBUMIN 4.2 3.1*    Recent Labs Lab 03/22/15 2036 03/24/15 0530 03/24/15 0950 03/24/15 1030 03/25/15 0815 03/26/15 0250 03/27/15 0250  WBC 13.7* 13.9*  --   --  10.3 7.7 6.5  HGB 14.6 11.2* 11.6* 10.2* 8.7* 9.0* 9.9*  HCT 43.8 33.9* 34.0* 30.0* 26.2* 27.4* 29.9*  MCV 85.7 88.1  --   --  86.8 86.2 85.9  PLT 225 190  --   --  142* 160 207   No results for input(s): CKTOTAL, CKMB, CKMBINDEX, TROPONINI in the last 168 hours. No results for input(s): LABPROT, INR in the last 72 hours. No results for input(s): COLORURINE, LABSPEC, [place], GLUCOSEU, HGBUR, BILIRUBINUR, KETONESUR, PROTEINUR, UROBILINOGEN, NITRITE, LEUKOCYTESUR in the last 72 hours.  Invalid input(s): APPERANCEUR     Component Value Date/Time   CHOL 85 03/26/2015 0250   TRIG 75 03/26/2015 0250   HDL 31* 03/26/2015 0250   CHOLHDL 2.7 03/26/2015 0250   VLDL 15 03/26/2015 0250   LDLCALC 39 03/26/2015 0250   Lab Results  Component Value Date    HGBA1C 5.9* 03/25/2015      Component Value Date/Time   LABOPIA NONE DETECTED 04/21/2013 1402   COCAINSCRNUR NONE DETECTED 04/21/2013 1402   LABBENZ NONE DETECTED 04/21/2013 1402   AMPHETMU NONE DETECTED 04/21/2013 1402   THCU NONE DETECTED 04/21/2013 1402   LABBARB NONE DETECTED 04/21/2013 1402     Recent Labs Lab 03/22/15 2036  ETH <5    I have personally reviewed the radiological images below and agree with the radiology interpretations.  Ct Head Wo Contrast  03/25/2015   IMPRESSION: Acute LEFT posterior cerebral artery territory infarct.  Small area LEFT parietal encephalomalacia consistent with remote ischemia. Minimal white matter changes most consistent chronic small vessel ischemic disease.    MRI and MRA - pending  Carotid Doppler  Pending  LE venous doppler - no DVT  MRV pelvis -preliminary report by my reading no evidence of DVT  2D Echocardiogram  - Left ventricle: The cavity size was normal. Wall thickness was increased in a pattern of mild LVH. The estimated ejection fraction was 65%. Wall motion was normal; there were no regional wall motion abnormalities. -  Right ventricle: The cavity size was mildly decreased. Systolic function was mildly reduced. - Pulmonary arteries: PA peak pressure: 43 mm Hg (S). - Pericardium, extracardiac: A trivial pericardial effusion was identified posterior to the heart. - Impressions: No cardiac source of embolism was identified, but cannot be ruled out on the basis of this examination. Impressions: - No cardiac source of embolism was identified, but cannot be ruled out on the basis of this examination.  EKG  sinus tachycardia. For complete results please see formal report.  PHYSICAL EXAM  Temp:  [98.8 F (37.1 C)-99.2 F (37.3 C)] 98.8 F (37.1 C) (06/12 0738) Pulse Rate:  [85-122] 100 (06/12 0738) Resp:  [19-31] 24 (06/12 0738) BP: (123-163)/(68-91) 123/68 mmHg (06/12 0738) SpO2:  [96 %-100 %] 96 %  (06/12 0738)  General - Well nourished, well developed middle aged Caucasian male, in no apparent distress.  Ophthalmologic - fundi not visualized due to noncooperation.  Cardiovascular - Regular rate and rhythm.  Mental Status -  Level of arousal and orientation to time, place, and person were intact. Language including expression, naming, repetition, comprehension was assessed and found intact. Attention span and concentration were  diminished. Recall 1/3 only.   Fund of Knowledge was assessed and was intact.  Cranial Nerves II - XII - II - Visual field test showed right lower quandrantonopia. III, IV, VI - Extraocular movements intact. V - Facial sensation intact bilaterally. VII - Facial movement intact bilaterally, but mild right nasolabial fold flattening. VIII - Hearing & vestibular intact bilaterally. X - Palate elevates symmetrically. XI - Chin turning & shoulder shrug intact bilaterally. XII - Tongue protrusion intact.  Motor Strength - The patient's strength was normal in RUE and RLE, however, difficult to check LUE and LLE due to injury and shoulder stripe and external fixator. Bulk was normal and fasciculations were absent.   Motor Tone - Muscle tone was assessed at the neck and appendages and was normal.  Reflexes - The patient's reflexes were symmetrical in all extremities and he had no pathological reflexes.  Sensory - Light touch, temperature/pinprick were assessed and were symmetrical.    Coordination - The patient had normal movements in the right hands with no ataxia or dysmetria.  Tremor was absent.  Gait and Station - not able to test.   ASSESSMENT/PLAN Mr. Justin [name] is a 64 y.o. male with history of HLD, bell's palsy, left embolic stroke in 5400  s/p loop recorder admitted for fall from ladder and injury of left shoulder and LLE s/p external fixation. Found to have visual field deficit last night and CT showed left occipital infarct. Symptoms  unchanged.     Stroke:  Bilateral posterior and anterior infarct, cardioembolic pattern secondary to unknown source.   MRI  Bilateral posterior and anterior infarcts,    MRA  negative  Carotidno extracranial stenosis  2D Echo  Unremarkable  LE venous doppler - no DVT  MRV pelvis negative  LDL 39  HgbA1c 5.9  Hypercoagulable and autoimmune work up pending  Loop recorder so far no afib  lovenox for VTE prophylaxis  Diet regular Room service appropriate?: Yes; Fluid consistency:: Thin   aspirin 81 mg orally every day prior to admission, now on aspirin 81 mg orally every day.   Patient counseled to be compliant with his antithrombotic medications  Ongoing aggressive stroke risk factor management  Hypertension  Home meds:   none Permissive hypertension (OK if <220/120) for 24-48 hours post stroke and then gradually normalized  within 5-7 days. Currently on   Stable  Hyperlipidemia  Home meds:  zocor 10mg    Currently statin resumed  LDL 39  Continue statin at discharge  Other Stroke Risk Factors  Hx stroke/TIA - 04/2013 left MCA/PCA and left parietal infarcts, embolic pattern, TEE positive for PFO and LE venous doppler negative. Loop recorder placed and no afib so far. On home ASA  Other Active Problems  Grade 3 open left distal tib-fib fracture status post I&D and external fixation revision, closed left anterior shoulder dislocation status post re-reduction  Other Pertinent History  Anemia  leukocystosis  Hospital day # 5  I had a long discussion with the patient and his wife regarding now his second embolic stroke which is of cryptogenic etiology and atrial fibrillation has not been found on his loop recorder. He does have a PFO and I discussed the recent FDA panel decision for the Amplatzer PFO closure device showing overall risk-benefit is in favor of using the device for closure. There is still no formal FDA approval for the device but perhaps the  patient may qualify for compassionate use. We will like him to heal from his current fractures and may consider this electively after 2-3 months. He does remain at risk for recurrent strokes and TIAs and needs ongoing aggressive risk factor control. change aspirin to Plavix for secondary stroke prevention.Follow up as outpatient in 2 months and will refer to cardiology for PFO closure electively. Stroke service will sign off. Kindly call for questions.  Antony Contras, MD Stroke Neurology 03/27/2015 10:43 AM    To contact Stroke Continuity provider, please refer to [email or web address] After hours, contact General Neurology

## 2015-03-27 NOTE — Progress Notes (Signed)
Physical Therapy Treatment Patient Details Name: DYLEN MCELHANNON MRN: [id number] DOB: April 10, 1951 Today's Date: 03/27/2015    History of Present Illness 64 yo male with fall from ladder resulting in LLE open pilon fracture and left glenohumeral dislocation, s/p I&D external fixature revision left ankle with wound vac 6/9.  On 03/25/15, rapid response called due to change in status - CVA/TIA.  Patient without residual impairments.   PMH includes stroke and Bell's Palsy.     PT Comments    Patient making good improvements with mobility.  Agree with need for CIR prior to d/c home.  Follow Up Recommendations  CIR     Equipment Recommendations  Other (comment) (TBD)    Recommendations for Other Services Rehab consult;OT consult     Precautions / Restrictions Precautions Precautions: Fall;Shoulder Shoulder Interventions: Shoulder sling/immobilizer;Shoulder abduction pillow Required Braces or Orthoses: Other Brace/Splint Other Brace/Splint: External fixator on LLE with VAC Restrictions Weight Bearing Restrictions: Yes LUE Weight Bearing: Non weight bearing LLE Weight Bearing: Non weight bearing    Mobility  Bed Mobility Overal bed mobility: Needs Assistance Bed Mobility: Supine to Sit     Supine to sit: Min assist;+2 for safety/equipment     General bed mobility comments: Cues for technique.  Assist to initiate movement with LLE, and to steady trunk during transition.  Once upright, patient with good sitting balance.  Transfers Overall transfer level: Needs assistance Equipment used: None Transfers: Squat Pivot Transfers     Squat pivot transfers: Min assist;+2 safety/equipment     General transfer comment: Verbal cues for NWB LUE/LLE, and for technique.  Assist to rise to standing and for balance initially.  Assist to steady during pivot to chair.  Patient able to maintain NWB as instructed.  Ambulation/Gait                 Stairs            Wheelchair  Mobility    Modified Rankin (Stroke Patients Only)       Balance                                    Cognition Arousal/Alertness: Awake/alert Behavior During Therapy: WFL for tasks assessed/performed Overall Cognitive Status: Within Functional Limits for tasks assessed                      Exercises      General Comments        Pertinent Vitals/Pain Pain Assessment: Faces Faces Pain Scale: Hurts little more Pain Location: Lt lower leg and Lt shoulder Pain Descriptors / Indicators: Aching;Sore Pain Intervention(s): Monitored during session;Repositioned    Home Living                      Prior Function            PT Goals (current goals can now be found in the care plan section) Progress towards PT goals: Progressing toward goals    Frequency  Min 5X/week    PT Plan Current plan remains appropriate    Co-evaluation             End of Session   Activity Tolerance: Patient tolerated treatment well;Patient limited by pain Patient left: in chair;with call bell/phone within reach     Time: 1151-1206 PT Time Calculation (min) (ACUTE ONLY): 15 min  Charges:  $Therapeutic Activity: 8-22  mins                    G Codes:      Despina Pole April 19, 2015, 3:55 PM Carita Pian. Sanjuana Kava, [place] Pager [phone]

## 2015-03-27 NOTE — Progress Notes (Signed)
Subjective: 3 Days Post-Op Procedure(s) (LRB): IRRIGATION AND DEBRIDEMENT  EXTERNAL FIXATURE REVISION LEFT ANKLE  (Left) Patient reports pain as mild.  Patient feels much better this am after advancing diet yesterday.  No chest pain/sob, lightheadedness/dizziness.    Objective: Vital signs in last 24 hours: Temp:  [98.7 F (37.1 C)-99.2 F (37.3 C)] 98.8 F (37.1 C) (06/12 0738) Pulse Rate:  [93-122] 100 (06/12 0738) Resp:  [20-31] 24 (06/12 0738) BP: (123-163)/(68-90) 123/68 mmHg (06/12 0738) SpO2:  [96 %-100 %] 96 % (06/12 0738)  Intake/Output from previous day: 06/11 0701 - 06/12 0700 In: -  Out: 3050 [Urine:3050] Intake/Output this shift: Total I/O In: -  Out: 400 [Urine:400]   Recent Labs  03/24/15 0950 03/24/15 1030 03/25/15 0815 03/26/15 0250 03/27/15 0250  HGB 11.6* 10.2* 8.7* 9.0* 9.9*    Recent Labs  03/26/15 0250 03/27/15 0250  WBC 7.7 6.5  RBC 3.18* 3.48*  HCT 27.4* 29.9*  PLT 160 207    Recent Labs  03/24/15 0950 03/24/15 1030  NA 138 137  K 3.2* 3.9  GLUCOSE 127*  --    No results for input(s): LABPT, INR in the last 72 hours.  Neurologically intact Neurovascular intact Sensation intact distally Intact pulses distally Compartment soft  LUE-sling and abduction pillow in place.  Swelling greatly decreased since yesterday.  Positive radial pulses. LLE- ex fix intact.  No drainage noted through dressing.  Wound vac functioning properly. EHL/FHL intact.  Swelling stable.  Assessment/Plan: 3 Days Post-Op Procedure(s) (LRB): IRRIGATION AND DEBRIDEMENT  EXTERNAL FIXATURE REVISION LEFT ANKLE  (Left) Up with therapy  Continue with current diet LUE- NWB-continue abduction pillow and sling at all times LLE- NWB-continue wound vac. pinsite dressing change prn. Ok to transfer from bed to chair ABLA-mild and stable Continue abx and dvt ppx Continue plan per neuro/card   [name] 03/27/2015, 8:13 AM

## 2015-03-28 DIAGNOSIS — S8292XS Unspecified fracture of left lower leg, sequela: Secondary | ICD-10-CM

## 2015-03-28 DIAGNOSIS — S8292XA Unspecified fracture of left lower leg, initial encounter for closed fracture: Secondary | ICD-10-CM

## 2015-03-28 LAB — LUPUS ANTICOAGULANT PANEL
DRVVT: 33.4 s (ref 0.0–55.1)
PTT LA: 43.8 s (ref 0.0–50.0)

## 2015-03-28 LAB — CBC
HEMATOCRIT: 30 % — AB (ref 39.0–52.0)
Hemoglobin: 10 g/dL — ABNORMAL LOW (ref 13.0–17.0)
MCH: 28.7 pg (ref 26.0–34.0)
MCHC: 33.3 g/dL (ref 30.0–36.0)
MCV: 86.2 fL (ref 78.0–100.0)
Platelets: 235 10*3/uL (ref 150–400)
RBC: 3.48 MIL/uL — ABNORMAL LOW (ref 4.22–5.81)
RDW: 13.9 % (ref 11.5–15.5)
WBC: 7.3 10*3/uL (ref 4.0–10.5)

## 2015-03-28 LAB — PROTEIN S, TOTAL: Protein S Ag, Total: 74 % (ref 58–150)

## 2015-03-28 LAB — PROTEIN S ACTIVITY: Protein S Activity: 45 % — ABNORMAL LOW (ref 60–145)

## 2015-03-28 LAB — RHEUMATOID FACTOR: Rhuematoid fact SerPl-aCnc: 9.3 IU/mL (ref 0.0–13.9)

## 2015-03-28 LAB — PROTEIN C, TOTAL: Protein C, Total: 55 % — ABNORMAL LOW (ref 70–140)

## 2015-03-28 LAB — MRSA PCR SCREENING: MRSA BY PCR: NEGATIVE

## 2015-03-28 LAB — PROTEIN C ACTIVITY: PROTEIN C ACTIVITY: 80 % (ref 74–151)

## 2015-03-28 MED ORDER — METHOCARBAMOL 500 MG PO TABS
500.0000 mg | ORAL_TABLET | Freq: Four times a day (QID) | ORAL | Status: DC | PRN
  Administered 2015-03-30 – 2015-03-31 (×4): 500 mg via ORAL
  Filled 2015-03-28 (×4): qty 1

## 2015-03-28 MED ORDER — HYDROXYZINE HCL 25 MG PO TABS
50.0000 mg | ORAL_TABLET | Freq: Two times a day (BID) | ORAL | Status: AC
  Administered 2015-03-28 – 2015-03-29 (×4): 50 mg via ORAL
  Filled 2015-03-28 (×2): qty 2
  Filled 2015-03-28: qty 1
  Filled 2015-03-28: qty 2
  Filled 2015-03-28: qty 1

## 2015-03-28 NOTE — Evaluation (Signed)
Occupational Therapy Evaluation Patient Details Name: Justin [name] MRN: [id number] DOB: [birth year] Today's Date: 03/28/2015    History of Present Illness 64 yo male with fall from ladder resulting in LLE open pilon fracture and left glenohumeral dislocation, s/p I&D external fixature revision left ankle with wound vac 6/9.  On 03/25/15, rapid response called due to change in status - CVA/TIA.  Patient without residual impairments.   PMH includes stroke and Bell's Palsy.    Clinical Impression   Pt was independent prior to admission.  Pt currently requires +2 assistance for transfers and assistance for bathing,dressing, and toileting. He demonstrates R visual filed deficits which he reports as improving. Pt will benefit from intensive rehab prior to return home. Will follow acutely.     Follow Up Recommendations  CIR    Equipment Recommendations  3 in 1 bedside comode;Hospital bed;Wheelchair (measurements OT);Wheelchair cushion (measurements OT)    Recommendations for Other Services       Precautions / Restrictions Precautions Precautions: Fall;Shoulder Type of Shoulder Precautions: s/p anterior shoulder subluxation and reductions x 2, pendulums, gentle ROM L shoulder--abd and FF, may use elbow to hand freely, may use L UE for mobilization Precaution Booklet Issued: No Other Brace/Splint: External fixator on LLE  Restrictions Weight Bearing Restrictions: Yes LUE Weight Bearing: Non weight bearing LLE Weight Bearing: Non weight bearing      Mobility Bed Mobility                  Transfers     Transfers: Sit to/from Stand;Stand Pivot Transfers Sit to Stand: +2 physical assistance;Mod assist Stand pivot transfers: Min assist;+2 physical assistance       General transfer comment: verbal cues for NWB precautions on L LE and technique, assist to rise and steady in standing    Balance Overall balance assessment: Needs assistance   Sitting balance-Leahy Scale:  Good                                      ADL Overall ADL's : Needs assistance/impaired Eating/Feeding: Independent;Sitting   Grooming: Wash/dry face;Wash/dry hands;Set up;Sitting   Upper Body Bathing: Sitting;Moderate assistance   Lower Body Bathing: Maximal assistance;Sit to/from stand   Upper Body Dressing : Minimal assistance;Sitting   Lower Body Dressing: Maximal assistance;Sit to/from stand   Toilet Transfer: +2 for physical assistance;Minimal assistance;Stand-pivot;BSC   Toileting- Clothing Manipulation and Hygiene: Total assistance;Sit to/from stand;+2 for physical assistance         General ADL Comments: began instruction in hemitechniques for bathing and dressing, performed AROM L elbow to hand, did not perform pendulums or gentle shoulder ROM this visit     Vision Vision Assessment?: Yes Additional Comments: R inferior quadrinopsia   Perception     Praxis      Pertinent Vitals/Pain Faces Pain Scale: Hurts little more Pain Location: L shoulder and L LE Pain Descriptors / Indicators: Discomfort Pain Intervention(s): Limited activity within patient's tolerance;Monitored during session;Repositioned;Ice applied (ice applied to L shoulder)     Hand Dominance Right   Extremity/Trunk Assessment Upper Extremity Assessment Upper Extremity Assessment: RUE deficits/detail;LUE deficits/detail RUE Deficits / Details: WNL LUE Deficits / Details: full AROM elbow to hand LUE: Unable to fully assess due to immobilization LUE Coordination: decreased gross motor   Lower Extremity Assessment Lower Extremity Assessment: Defer to PT evaluation   Cervical / Trunk Assessment Cervical / Trunk Assessment: Normal  Communication Communication Communication: No difficulties   Cognition Arousal/Alertness: Awake/alert Behavior During Therapy: WFL for tasks assessed/performed Overall Cognitive Status: Within Functional Limits for tasks assessed                      General Comments       Exercises       Shoulder Instructions      Home Living Family/patient expects to be discharged to:: Private residence Living Arrangements: Spouse/significant other Available Help at Discharge: Family;Available 24 hours/day   Home Access: Stairs to enter Entrance Stairs-Number of Steps: 2 Entrance Stairs-Rails: Right Home Layout: Two level     Bathroom Shower/Tub: Occupational psychologist: Standard     Home Equipment: Shower seat - built in          Prior Functioning/Environment Level of Independence: Independent        Comments: pt is employed full time    OT Diagnosis: Generalized weakness;Acute pain   OT Problem List: Decreased strength;Decreased activity tolerance;Decreased knowledge of use of DME or AE;Decreased knowledge of precautions;Cardiopulmonary status limiting activity;Impaired UE functional use;Pain;Impaired vision/perception   OT Treatment/Interventions: Self-care/ADL training;DME and/or AE instruction;Therapeutic activities;Visual/perceptual remediation/compensation;Patient/family education    OT Goals(Current goals can be found in the care plan section) Acute Rehab OT Goals Patient Stated Goal: Get better OT Goal Formulation: With patient Time For Goal Achievement: 04/11/15 Potential to Achieve Goals: Good ADL Goals Pt Will Perform Upper Body Bathing: with set-up;sitting Pt Will Perform Lower Body Bathing: with min assist;sitting/lateral leans Pt Will Perform Upper Body Dressing: with set-up;sitting Pt Will Perform Lower Body Dressing: with min assist;sitting/lateral leans Pt Will Transfer to Toilet: with min assist;stand pivot transfer;bedside commode Pt Will Perform Toileting - Clothing Manipulation and hygiene: with min assist;sitting/lateral leans Pt/caregiver will Perform Home Exercise Program: Left upper extremity;With written HEP provided (pendulums, gentle ROM FF/abd, AROM elbow to  hand) Additional ADL Goal #1: Pt will employ compensatory strategies for R inferior field impairment independently.  OT Frequency: Min 2X/week   Barriers to D/C:            Co-evaluation PT/OT/SLP Co-Evaluation/Treatment: Yes Reason for Co-Treatment: For patient/therapist safety;Complexity of the patient's impairments (multi-system involvement)   OT goals addressed during session: ADL's and self-care      End of Session Nurse Communication: Mobility status  Activity Tolerance: Treatment limited secondary to medical complications (Comment) (HR increased to 154 with transfer) Patient left: in chair;with call bell/phone within reach;with family/visitor present   Time: 1030-1053 OT Time Calculation (min): 23 min Charges:  OT General Charges $OT Visit: 1 Procedure OT Evaluation $Initial OT Evaluation Tier I: 1 Procedure G-Codes:    Malka So 03/28/2015, 11:44 AM [phone]

## 2015-03-28 NOTE — Progress Notes (Signed)
On rounds this am reported by his wife that he has a rash on his back since Thursday that was reported. Noted red raised rash covering entire back with several blisters noted.

## 2015-03-28 NOTE — Consult Note (Signed)
Physical Medicine and Rehabilitation Consult  Reason for Consult: Left tib/fib fracture, left shoulder dislocation, embolic bilateral CVA Referring Physician:  Dr. Marcelino Scot.    HPI: Justin [name] is a 64 y.o. male with history of TIA, Left parietal infarct 04/2013 s/p loop recorder implantation,  PFO, Bell's palsy; who fell off a ladder on 03/22/15 with subsequent left grade 3A open left pilon fibula/ tibia fracture and left anterior shoulder dislocation. No LOC, CP or palpitations reported prior to fall.  He was taken to OR emergently for left shoulder close reduction as well as I & D left distal Tib-Fib with placement of external fixator.  Dr. Marcelino Scot consulted for input and patient taken to OR on 06/09 for I & D with ORIF left pilon, tibia and Fibula as well as CR of left shoulder redislocation. To be NWB LUE/LLE. Post op, patient noted to be lethargic with delayed responses as well as visual deficits. Stat CT head done revealing acute left PCA territory infarct. MRI/MRA brain done revealing acute medial left occipital lobe infarct with additional smaller cortical infarcts bilateral occipital lobes and scattered cortical/white matter infarcts in watershed distribution bilaterally. No significant intracranial stenosis/ atherosclerosis noted. Carotid dopplers without significant ICA stenosis. BLE dopplers negative for DVT. Cardiac echo with EF 65% with no wall abnormality.    Neurology consulted for input and recommended changing ASA to plavix for secondary stroke prevention of Embolic CVA of questionable etiology.  Loop recorder without evidence of A Fib. Dr. Leonie Man has discussed the question of PFO closure/compassionate use of FDA study device after healing from fractures. Visual changes improving and lethargy has resolved. MRI left shoulder done for evaluation and revealed avulsion of glenoid attache ment of inferior glenohumeral ligament and multiple muscle strains around the shoulder. He has been  cleared to start gentle ROM left shoulder today.  External fixator with VAC in place on LLE with plans for definitive surgery in the next 10-14 days. Therapy ongoing and CIR recommended for by MD and Rehab team.    Review of Systems  Constitutional: Negative for malaise/fatigue.  HENT: Negative for hearing loss.   Eyes: Negative for blurred vision and double vision.       Right lower quadrant field cut  Respiratory: Negative for cough and shortness of breath.   Cardiovascular: Negative for chest pain and palpitations.  Gastrointestinal: Negative for heartburn, nausea and constipation.  Genitourinary: Negative for urgency and frequency.  Musculoskeletal: Positive for joint pain. Negative for myalgias.  Neurological: Negative for dizziness, tingling, sensory change, focal weakness and headaches.  Endo/Heme/Allergies: Does not bruise/bleed easily.  Psychiatric/Behavioral: The patient has insomnia.       Past Medical History  Diagnosis Date  . Bell's palsy 1980's; [id number]; 2000's; 12/2012; 03/2013    "multiple times" (04/21/2013)  . TIA (transient ischemic attack) 04/21/2013  . Stroke   . Hypercholesteremia     Past Surgical History  Procedure Laterality Date  . Tee without cardioversion N/A 04/24/2013    Procedure: TRANSESOPHAGEAL ECHOCARDIOGRAM (TEE);  Surgeon: Thayer Headings, MD;  Location: [place];  Service: Cardiovascular;  Laterality: N/A;  . Implanted loop recorder    . Colonoscopy N/A 07/13/2013    Procedure: COLONOSCOPY;  Surgeon: Danie Binder, MD;  [facility];  Service: Endoscopy;  Laterality: N/A;  8:30 AM  . Loop recorder implant N/A 04/24/2013    Procedure: LOOP RECORDER IMPLANT;  Surgeon: Deboraha Sprang, MD;  Location: [facility] CATH LAB;  Service: Cardiovascular;  Laterality: N/A;  . Shoulder closed reduction Left 03/22/2015    Procedure: CLOSED REDUCTION SHOULDER;  Surgeon: Rod Can, MD;  Location: [place];  Service: Orthopedics;  Laterality: Left;  . External  fixation leg Left 03/22/2015    Procedure: EXTERNAL FIXATION left ankle;  Surgeon: Rod Can, MD;  Location: [place];  Service: Orthopedics;  Laterality: Left;  . I&d extremity Left 03/22/2015    Procedure: IRRIGATION AND DEBRIDEMENT OPEN TIBIA -FIBULA  FRACTURE;  Surgeon: Rod Can, MD;  Location: [place];  Service: Orthopedics;  Laterality: Left;  . External fixation leg Left 03/24/2015    Procedure: IRRIGATION AND DEBRIDEMENT  EXTERNAL FIXATURE REVISION LEFT ANKLE ;  Surgeon: Altamese Byars, MD;  Location: [place];  Service: Orthopedics;  Laterality: Left;    Family History  Problem Relation Age of Onset  . Heart disease Mother   . Alcohol abuse Mother   . Colon cancer Neg Hx     Social History:  Married. Works in Engineer, technical sales.  He reports that he has never smoked. He has never used smokeless tobacco. He reports that he drinks a glass of wine on rare occasion. He reports that he does not use illicit drugs.    Allergies: No Known Allergies    Medications Prior to Admission  Medication Sig Dispense Refill  . aspirin 325 MG tablet Take 325 mg by mouth daily.    . simvastatin (ZOCOR) 10 MG tablet Take 10 mg by mouth daily.    Marland Kitchen acyclovir (ZOVIRAX) 800 MG tablet Take 1 tablet (800 mg total) by mouth 4 (four) times daily. (Patient not taking: Reported on 03/22/2015) 20 tablet 1  . aspirin 325 MG tablet Take 1 tablet (325 mg total) by mouth daily. (Patient not taking: Reported on 03/22/2015) 30 tablet 1  . predniSONE (DELTASONE) 10 MG tablet 6 tabs on day 1, 5 tabs on day 2, 4 tabs on day 3, 3 tabs on day 4, 2 tabs on day 5, and 1 tab on day 6. (Patient not taking: Reported on 03/22/2015) 21 tablet 0  . simvastatin (ZOCOR) 10 MG tablet Take 1 tablet (10 mg total) by mouth at bedtime. (Patient not taking: Reported on 03/22/2015) 90 tablet 3  . zoster vaccine live, PF, (ZOSTAVAX) 02725 UNT/0.65ML injection Inject 19,400 Units into the skin once. (Patient not taking: Reported on 02/21/2015) 1 each 0    Home: Home  Living Family/patient expects to be discharged to:: Private residence Living Arrangements: Spouse/significant other Available Help at Discharge: Family, Available 24 hours/day Type of Home: Apartment Home Access: Stairs to enter Technical brewer of Steps: 2 Entrance Stairs-Rails: Right Home Layout: One level Home Equipment: Shower seat - built in  Functional History: Prior Function Level of Independence: Independent Functional Status:  Mobility: Bed Mobility Overal bed mobility: Needs Assistance Bed Mobility: Supine to Sit Supine to sit: Min assist, +2 for safety/equipment Sit to supine: Min assist General bed mobility comments: Cues for technique.  Assist to initiate movement with LLE, and to steady trunk during transition.  Once upright, patient with good sitting balance. Transfers Overall transfer level: Needs assistance Equipment used: None Transfers: Squat Pivot Transfers Squat pivot transfers: Min assist, +2 safety/equipment General transfer comment: Verbal cues for NWB LUE/LLE, and for technique.  Assist to rise to standing and for balance initially.  Assist to steady during pivot to chair.  Patient able to maintain NWB as instructed.      ADL:    Cognition: Cognition Overall Cognitive Status: Within Functional Limits  for tasks assessed Orientation Level: Oriented X4 Cognition Arousal/Alertness: Awake/alert Behavior During Therapy: WFL for tasks assessed/performed Overall Cognitive Status: Within Functional Limits for tasks assessed Memory: Decreased recall of precautions  Blood pressure 148/89, pulse 111, temperature 98.3 F (36.8 C), temperature source Oral, resp. rate 20, height 5\' 9"  (1.753 m), weight 85 kg (187 lb 6.3 oz), SpO2 95 %. Physical Exam  Nursing note and vitals reviewed. Constitutional: He is oriented to person, place, and time. He appears well-developed and well-nourished.  HENT:  Head: Normocephalic and atraumatic.  Mouth/Throat:  Oropharynx is clear and moist.  Eyes: Conjunctivae are normal. Pupils are equal, round, and reactive to light.  Neck: Normal range of motion. Neck supple.  Cardiovascular: Normal rate and regular rhythm.   Respiratory: Effort normal and breath sounds normal. No respiratory distress. He has no wheezes.  GI: Soft. Bowel sounds are normal. He exhibits no distension. There is no tenderness.  Musculoskeletal: He exhibits tenderness (left shoulder with movement).  External fixator on LLE. Minimal edema with ace wrap on ankle.   Neurological: He is alert and oriented to person, place, and time. A cranial nerve deficit is present.  Speech clear. Able to follow commands without difficulty. Finger to nose intact RUE.  Right lower quandrantonopia. RUE and RLE strength grossly 4 to 5/5. Sensation appears grossly intact. Has good insight and awareness   Skin: Skin is warm and dry. Rash noted.  Diffuse macular papular rash on the back.   Psychiatric: He has a normal mood and affect. His behavior is normal. Judgment and thought content normal.    Results for orders placed or performed during the hospital encounter of 03/22/15 (from the past 24 hour(s))  CBC     Status: Abnormal   Collection Time: 03/28/15  3:03 AM  Result Value Ref Range   WBC 7.3 4.0 - 10.5 K/uL   RBC 3.48 (L) 4.22 - 5.81 MIL/uL   Hemoglobin 10.0 (L) 13.0 - 17.0 g/dL   HCT 30.0 (L) 39.0 - 52.0 %   MCV 86.2 78.0 - 100.0 fL   MCH 28.7 26.0 - 34.0 pg   MCHC 33.3 30.0 - 36.0 g/dL   RDW 13.9 11.5 - 15.5 %   Platelets 235 150 - 400 K/uL   No results found.  Assessment/Plan: Diagnosis: embolic bilateral CVA's, left tib-fib fx, and left shoulder dislocation 1. Does the need for close, 24 hr/day medical supervision in concert with the patient's rehab needs make it unreasonable for this patient to be served in a less intensive setting? Yes 2. Co-Morbidities requiring supervision/potential complications: pain mgt, wound care 3. Due to  bladder management, bowel management, safety, skin/wound care, disease management, medication administration, pain management and patient education, does the patient require 24 hr/day rehab nursing? Yes 4. Does the patient require coordinated care of a physician, rehab nurse, PT (1-2 hrs/day, 5 days/week) and OT (1-2 hrs/day, 5 days/week) to address physical and functional deficits in the context of the above medical diagnosis(es)? Yes Addressing deficits in the following areas: balance, endurance, locomotion, strength, transferring, bowel/bladder control, bathing, dressing, feeding, grooming, toileting and psychosocial support 5. Can the patient actively participate in an intensive therapy program of at least 3 hrs of therapy per day at least 5 days per week? Yes 6. The potential for patient to make measurable gains while on inpatient rehab is excellent 7. Anticipated functional outcomes upon discharge from inpatient rehab are supervision and min assist  with PT, supervision and min assist with OT, n/a  with SLP. 8. Estimated rehab length of stay to reach the above functional goals is: 10-15 days 9. Does the patient have adequate social supports and living environment to accommodate these discharge functional goals? Yes 10. Anticipated D/C setting: Home 11. Anticipated post D/C treatments: HH therapy and Outpatient therapy 12. Overall Rehab/Functional Prognosis: excellent  RECOMMENDATIONS: This patient's condition is appropriate for continued rehabilitative care in the following setting: CIR Patient has agreed to participate in recommended program. Yes Note that insurance prior authorization may be required for reimbursement for recommended care.  Comment: Could bring to rehab and return to acute for removal/revision of ex-fix with subsequent discharge to home afterwards. Rehab Admissions Coordinator to follow up.  Thanks,  [name], MD, Mellody Drown     03/28/2015

## 2015-03-28 NOTE — Progress Notes (Signed)
Report called to [facility] and given to [place], Therapist, sports. To floor by bed

## 2015-03-28 NOTE — Progress Notes (Signed)
Rash to back reported to [name], [facility] on rounds.

## 2015-03-28 NOTE — Progress Notes (Signed)
[facility] Progress Note  Subjective  Doing ok this am Pain tolerable L UEx and L LEx Vision improved  Notes significant rash on back  First time that a clinician has been made aware of it   +flatus +BM Voiding without difficulty   Review of Systems  Constitutional: Negative for fever and chills.  Respiratory: Negative for shortness of breath and wheezing.   Cardiovascular: Negative for chest pain and palpitations.  Gastrointestinal: Negative for nausea, vomiting, abdominal pain and constipation.  Genitourinary: Negative for dysuria.  Musculoskeletal:       L ankle and shoulder pain   Skin: Positive for itching and rash (back).  Neurological: Negative for tingling and sensory change.     Objective   BP 148/89 mmHg  Pulse 111  Temp(Src) 98.3 F (36.8 C) (Oral)  Resp 20  Ht 5\' 9"  (1.753 m)  Wt 85 kg (187 lb 6.3 oz)  BMI 27.66 kg/m2  SpO2 95%  Intake/Output      06/12 0701 - 06/13 0700 06/13 0701 - 06/14 0700   P.O. 600    IV Piggyback     Total Intake(mL/kg) 600 (7.1)    Urine (mL/kg/hr) 2150 (1.1)    Drains 25 (0)    Stool 1 (0)    Total Output 2176     Net -8101            Labs  Results for Justin [name], Justin [name] (MRN [id number]) as of 03/28/2015 09:19  Ref. Range 03/28/2015 03:03  WBC Latest Ref Range: 4.0-10.5 K/uL 7.3  RBC Latest Ref Range: 4.22-5.81 MIL/uL 3.48 (L)  Hemoglobin Latest Ref Range: 13.0-17.0 g/dL 10.0 (L)  HCT Latest Ref Range: 39.0-52.0 % 30.0 (L)  MCV Latest Ref Range: 78.0-100.0 fL 86.2  MCH Latest Ref Range: 26.0-34.0 pg 28.7  MCHC Latest Ref Range: 30.0-36.0 g/dL 33.3  RDW Latest Ref Range: 11.5-15.5 % 13.9  Platelets Latest Ref Range: 150-400 K/uL 235    Exam  Gen: awake and alert, resting comfortably, NAD, wife at bedside Skin: maculopapular rash to posterior trunk, no weeping lesion Lungs:clear anterior fields Cardiac: RRR, s1 and s2 Abd: +BS, NTND Ext:  Left Upper Extremity               Abduction pillow  removed             Motor and sensory functions grossly intact             Ext warm             + radial pulse   Elbow ROM somewhat restricted from being in sling  Pt tolerated gentle abduction and FF of shoulder        Left Lower Extremity               Ex fix stable and intact, dressing stable             Proximal pinsite dressing saturated, kerlix changed  Heel pin dressing changed as well               VAC dressing removed  Traumatic wound looks very healthy, no evidence of tissue necrosis   No active drainage  No erythema   Moderate swelling still present to L lower extremity   Skin does not wrinkle with gentle compression  Lateral incision healing well              DPN, SPN, TN sensation grossly intact  Ext warm             EHL, FHL, lesser toe motor functions intact              Imaging  MRV pelvis   IMPRESSION: Unremarkable MR venogram of the pelvis, with flow signal maintained within the proximal femoral veins, bilateral iliac veins, and the visualized IVC.   CT L ankle   IMPRESSION: Improved position and alignment of the severely comminuted intra-articular fracture of the tibial plafond with external fixation and internal stabilization pins. Please see 3D reformats.   Posterior plafond translated about 4 mm cephalad and is in extension   The fibular fracture is in good position and alignment with a single intramedullary pin in place.  Assessment and Plan   POD/HD#: 41  64 y/o RHD male s/p fall off ladder    1. Grade 3 open left distal tib-fib fracture status post I&D and external fixation revision, closed left anterior shoulder dislocation status post re-reduction              ex fix revised and repeat I&D performed             Flexible fibular nail placed and tibia pinned with k-wire               Shoulder reduced again and was stable with stressing in OR    CT reviewed, would likely benefit ORIF of distal tibia due to malalignment of  posterior plafond, could consider posterior approach given fx pattern and traumatic wound.   Surgery not likely to take place for 10-14 days                           NWB L upper extremity and L lower extremity                       Gentle L shoulder motion- pendulums, abduction and FF  Unrestricted ROM L elbow, forearm, wrist and hand   Pt can use L arm to assist with mobilization              Continue with ice and elevation             pinsite care as needed  2. Pain management:             Continue with current management  3. ABL anemia/Hemodynamics  Stable              CBC in a.m.  4. Medical issues              acute ischemia left medial occipital lobe, likely embolism to the left PCA.                         Per Stroke team                         Pt started on plavix as this is his 2nd cryptogenic stroke                          workup has been negative to date   hypercoag labs still pending   5. DVT/PE prophylaxis:             continue lovenox   6. ID:  IV abx completed for open fracture treatment  7. Metabolic Bone Disease:             vitamin d levels borderline low                         Add vitamin d 3 1000 IUs bid                           Add vitamin d 2 50000 IUs daily x 8 weeks                           Hgb A1c 5.9%  8. Activity:             Activity as tolerated             Non-Weightbearing left upper extremity and left lower extremity               9. FEN/Foley/Lines:           reg diet as tolerated   10.Ex-fix/Splint care:             ok to manipulate extremity by fixator             Change pinsite dressings as needed  11. Skin rash  Possible drug rash  IV abx completed  Will stop lyrica   Add vistaril for antihistamine properties, will see how pt responds to this. May add hydrocortisone cream               12. Dispo:            CIR eval  SW eval for SNF in the event pt not approved for [place], PA-C [facility] [phone] (O) 03/28/2015 9:17 AM

## 2015-03-28 NOTE — Care Management Note (Signed)
Case Management Note  Patient Details  Name: Justin [name] MRN: [id number] Date of Birth: 09-22-1951  Subjective/Objective:      Pt in system as self-pay, spouse has pt's insurance card, he is covered by [employer].  Requested she provide card to Admitting Dept so they can load into system.  Therapies recommend CIR and pt agrees, will need insurance auth.  Will notify CSW that pt will need SNF back-up if insurance does not approve.                          Action/Plan: CIR vs SNF when medically stable.  Expected Discharge Date:                  Expected Discharge Plan:  IP Rehab Facility  In-House Referral:  Clinical Social Work  Discharge planning Services  CM Consult  Post Acute Care Choice:    Choice offered to:     DME Arranged:    DME Agency:     HH Arranged:    [place] Agency:     Status of Service:  In process, will continue to follow  Medicare Important Message Given:    Date Medicare IM Given:    Medicare IM give by:    Date Additional Medicare IM Given:    Additional Medicare Important Message give by:     If discussed at [place] of Stay Meetings, dates discussed:    Additional Comments:  Girard Cooter, RN 03/28/2015, 10:07 AM

## 2015-03-28 NOTE — Progress Notes (Signed)
Physical Therapy Treatment Patient Details Name: Justin [name] MRN: [id number] DOB: January 30, 1951 Today's Date: 03/28/2015    History of Present Illness 64 yo male with fall from ladder resulting in LLE open pilon fracture and left glenohumeral dislocation, s/p I&D external fixature revision left ankle with wound vac 6/9.  On 03/25/15, rapid response called due to change in status - CVA/TIA.  Patient without residual impairments.   PMH includes stroke and Bell's Palsy.     PT Comments    Pt admitted with above diagnosis. Pt currently with functional limitations due to balance and endurance deficits. Pt progressing.  Left note for MD to see if he will allow RW use as this would make transfers easier.  Will continue PT.   Pt will benefit from skilled PT to increase their independence and safety with mobility to allow discharge to the venue listed below.    Follow Up Recommendations  CIR     Equipment Recommendations  Other (comment) (TBD)    Recommendations for Other Services Rehab consult;OT consult     Precautions / Restrictions Precautions Precautions: Fall;Shoulder Type of Shoulder Precautions: s/p anterior shoulder subluxation and reductions x 2, pendulums, gentle ROM L shoulder--abd and FF, may use elbow to hand freely, may use L UE for mobilization Precaution Booklet Issued: No Required Braces or Orthoses: Other Brace/Splint Other Brace/Splint: External fixator on LLE  Restrictions Weight Bearing Restrictions: Yes LUE Weight Bearing:  (clarifying with MD as note states use LUE for mobilization) LLE Weight Bearing: Non weight bearing    Mobility  Bed Mobility Overal bed mobility: Needs Assistance Bed Mobility: Supine to Sit     Supine to sit: Min assist;+2 for safety/equipment     General bed mobility comments: Cues for technique.  Assist to initiate movement with LLE, and to steady trunk during transition.  Once upright, patient with good sitting  balance.  Transfers Overall transfer level: Needs assistance Equipment used: None Transfers: Sit to/from [facility] Sit to Stand: +2 physical assistance;Mod assist Stand pivot transfers: Min assist;+2 physical assistance       General transfer comment: verbal cues for NWB precautions on L LE and technique, assist to rise and steady in standing  Assisted pt to 3N1 from bed and then from 3N1 to recliner. Pt had more difficulty getting up from 3N1 and easier to get up from bed.    Ambulation/Gait                 Stairs            Wheelchair Mobility    Modified Rankin (Stroke Patients Only)       Balance Overall balance assessment: Needs assistance;History of Falls Sitting-balance support: No upper extremity supported;Feet supported Sitting balance-Leahy Scale: Good                              Cognition Arousal/Alertness: Awake/alert Behavior During Therapy: WFL for tasks assessed/performed Overall Cognitive Status: Within Functional Limits for tasks assessed                      Exercises General Exercises - Lower Extremity Ankle Circles/Pumps: AROM;Right;10 reps;Seated Long Arc Quad: Left;10 reps;Seated    General Comments General comments (skin integrity, edema, etc.): Pt needed assist to clean bottom while PT stabilized pt in standing.  Left note for MD to determine if pt can use walker for mobilizing or if Md wants pt  to stick with transfers.        Pertinent Vitals/Pain Pain Assessment: Faces Faces Pain Scale: Hurts little more Pain Location: L UE and LE Pain Descriptors / Indicators: Discomfort Pain Intervention(s): Limited activity within patient's tolerance;Monitored during session;Premedicated before session;Repositioned  VSS.    Home Living Family/patient expects to be discharged to:: Private residence Living Arrangements: Spouse/significant other Available Help at Discharge: Family;Available 24  hours/day   Home Access: Stairs to enter Entrance Stairs-Rails: Right Home Layout: Two level Home Equipment: Shower seat - built in      Prior Function Level of Independence: Independent      Comments: pt is employed full time   PT Goals (current goals can now be found in the care plan section) Acute Rehab PT Goals Patient Stated Goal: Get better Progress towards PT goals: Progressing toward goals    Frequency  Min 5X/week    PT Plan Current plan remains appropriate    Co-evaluation PT/OT/SLP Co-Evaluation/Treatment: Yes Reason for Co-Treatment: For patient/therapist safety PT goals addressed during session: Mobility/safety with mobility OT goals addressed during session: ADL's and self-care     End of Session Equipment Utilized During Treatment: Gait belt Activity Tolerance: Patient tolerated treatment well;Patient limited by pain Patient left: in chair;with call bell/phone within reach     Time: 0955-1016 PT Time Calculation (min) (ACUTE ONLY): 21 min  Charges:  $Therapeutic Activity: 8-22 mins                    G CodesIrwin [name] F 04-10-15, 1:04 PM [employer] [facility] Acute Rehabilitation [phone] (pager)

## 2015-03-29 LAB — CBC
HCT: 33.5 % — ABNORMAL LOW (ref 39.0–52.0)
Hemoglobin: 11.1 g/dL — ABNORMAL LOW (ref 13.0–17.0)
MCH: 28.3 pg (ref 26.0–34.0)
MCHC: 33.1 g/dL (ref 30.0–36.0)
MCV: 85.5 fL (ref 78.0–100.0)
Platelets: 292 10*3/uL (ref 150–400)
RBC: 3.92 MIL/uL — ABNORMAL LOW (ref 4.22–5.81)
RDW: 14.5 % (ref 11.5–15.5)
WBC: 8 10*3/uL (ref 4.0–10.5)

## 2015-03-29 LAB — VITAMIN D 1,25 DIHYDROXY
VITAMIN D 1, 25 (OH) TOTAL: 29 pg/mL
Vitamin D3 1, 25 (OH)2: 29 pg/mL

## 2015-03-29 LAB — COMPREHENSIVE METABOLIC PANEL
ALBUMIN: 3 g/dL — AB (ref 3.5–5.0)
ALT: 74 U/L — ABNORMAL HIGH (ref 17–63)
ANION GAP: 9 (ref 5–15)
AST: 110 U/L — AB (ref 15–41)
Alkaline Phosphatase: 71 U/L (ref 38–126)
BILIRUBIN TOTAL: 0.9 mg/dL (ref 0.3–1.2)
BUN: 15 mg/dL (ref 6–20)
CHLORIDE: 106 mmol/L (ref 101–111)
CO2: 22 mmol/L (ref 22–32)
CREATININE: 1.01 mg/dL (ref 0.61–1.24)
Calcium: 9 mg/dL (ref 8.9–10.3)
GFR calc Af Amer: >60 mL/min (ref >60)
GFR calc non Af Amer: >60 mL/min (ref >60)
Glucose, Bld: 113 mg/dL — ABNORMAL HIGH (ref 65–99)
Potassium: 3.8 mmol/L (ref 3.5–5.1)
SODIUM: 137 mmol/L (ref 135–145)
Total Protein: 6.1 g/dL — ABNORMAL LOW (ref 6.5–8.1)

## 2015-03-29 NOTE — Progress Notes (Signed)
Orthopaedic Trauma Service Progress Note  Subjective  Doing well L arm feels good, dec pain Tolerating diet Insurance issues holding up dispo   ROS  As above  Objective   BP 137/83 mmHg  Pulse 102  Temp(Src) 98.5 F (36.9 C) (Oral)  Resp 19  Ht 5' 9"  (1.753 m)  Wt 85 kg (187 lb 6.3 oz)  BMI 27.66 kg/m2  SpO2 94%  Intake/Output      06/13 0701 - 06/14 0700 06/14 0701 - 06/15 0700   P.O. 480 240   Total Intake(mL/kg) 480 (5.6) 240 (2.8)   Urine (mL/kg/hr) 525 (0.3) 350 (0.5)   Drains     Stool  0 (0)   Total Output 525 350   Net -45 -110        Urine Occurrence 1 x 1 x   Stool Occurrence 1 x 1 x     Labs  Results for [name], [name] (MRN [id number]) as of 03/29/2015 14:49  Ref. Range 03/29/2015 05:05  Sodium Latest Ref Range: 135-145 mmol/L 137  Potassium Latest Ref Range: 3.5-5.1 mmol/L 3.8  Chloride Latest Ref Range: 101-111 mmol/L 106  CO2 Latest Ref Range: 22-32 mmol/L 22  BUN Latest Ref Range: 6-20 mg/dL 15  Creatinine Latest Ref Range: 0.61-1.24 mg/dL 1.01  Calcium Latest Ref Range: 8.9-10.3 mg/dL 9.0  EGFR (Non-African Amer.) Latest Ref Range: >60 mL/min >60  EGFR (African American) Latest Ref Range: >60 mL/min >60  Glucose Latest Ref Range: 65-99 mg/dL 113 (H)  Anion gap Latest Ref Range: 5-15  9  Alkaline Phosphatase Latest Ref Range: 38-126 U/L 71  Albumin Latest Ref Range: 3.5-5.0 g/dL 3.0 (L)  AST Latest Ref Range: 15-41 U/L 110 (H)  ALT Latest Ref Range: 17-63 U/L 74 (H)  Total Protein Latest Ref Range: 6.5-8.1 g/dL 6.1 (L)  Total Bilirubin Latest Ref Range: 0.3-1.2 mg/dL 0.9  WBC Latest Ref Range: 4.0-10.5 K/uL 8.0  RBC Latest Ref Range: 4.22-5.81 MIL/uL 3.92 (L)  Hemoglobin Latest Ref Range: 13.0-17.0 g/dL 11.1 (L)  HCT Latest Ref Range: 39.0-52.0 % 33.5 (L)  MCV Latest Ref Range: 78.0-100.0 fL 85.5  MCH Latest Ref Range: 26.0-34.0 pg 28.3  MCHC Latest Ref Range: 30.0-36.0 g/dL 33.1  RDW Latest Ref Range: 11.5-15.5 % 14.5  Platelets  Latest Ref Range: 150-400 K/uL 292    Exam  Gen: awake and alert, resting comfortably, NAD, sitting in chair  Lungs:clear anterior fields Cardiac: RRR, s1 and s2 Abd: +BS, NTND Ext:   Left Upper Extremity               active abduction to about 30 degrees without significant pain              Motor and sensory functions grossly intact             Ext warm             + radial pulse               Elbow ROM improved       Left Lower Extremity               Ex fix stable and intact, dressing stable             Moderate swelling still present to L lower extremity                            DPN, SPN, TN sensation  grossly intact             Ext warm             EHL, FHL, lesser toe motor functions intact   Assessment and Plan   POD/HD#: 17   64 y/o RHD male s/p fall off ladder    1. Grade 3 open left distal tib-fib fracture status post I&D and external fixation revision, closed left anterior shoulder dislocation status post re-reduction              ex fix revised and repeat I&D performed             Flexible fibular nail placed and tibia pinned with k-wire               Shoulder reduced again and was stable with stressing in OR               CT reviewed, would likely benefit ORIF of distal tibia due to malalignment of posterior plafond,  posterior approach given fx pattern and traumatic wound.   will recheck soft tissues tomorrow, if swelling minimal plan for ORIF Thursday                           WBAT L upper extremity with platform walker   NWB L LEx                      Gentle L shoulder motion- pendulums, abduction and FF             Unrestricted ROM L elbow, forearm, wrist and hand               Pt can use L arm to assist with mobilization               Continue with ice and elevation             pinsite care as needed  2. Pain management:             Continue with current management  3. ABL anemia/Hemodynamics             Stable    4. Medical issues               acute ischemia left medial occipital lobe, likely embolism to the left PCA.                         Per Stroke team                         Pt started on plavix as this is his 2nd cryptogenic stroke                           workup has been negative to date                         hypercoag labs still pending   5. DVT/PE prophylaxis:             continue lovenox   6. ID:               IV abx completed for open fracture treatment  7. Metabolic Bone Disease:             vitamin d levels borderline low  Add vitamin d 3 1000 IUs bid                           Add vitamin d 2 50000 IUs daily x 8 weeks                           Hgb A1c 5.9%  8. Activity:             Activity as tolerated             Non-Weightbearing left upper extremity and left lower extremity               9. FEN/Foley/Lines:           reg diet as tolerated   10.Ex-fix/Splint care:             ok to manipulate extremity by fixator             Change pinsite dressings as needed  11. Skin rash             Possible drug rash             IV abx completed             lyrica stopped               May add hydrocortisone cream, currently on vistaril               12. Dispo:            CIR eval  Clarify insurance issues              SW eval for SNF in the event pt not approved for CIR     [name], PA-C [facility] [phone] (O) 03/29/2015 2:49 PM

## 2015-03-29 NOTE — Clinical Social Work Note (Signed)
Clinical Social Work Assessment  Patient Details  Name: Justin [name] MRN: [id number] Date of Birth: Jan 08, 1951  Date of referral:  03/29/15               Reason for consult:  Facility Placement                Permission sought to share information with:  Family Supports, Customer service manager Permission granted to share information::  Yes, Verbal Permission Granted  Name::      (wife Justin [name])  Agency::   [facility] SNF; Facilities in the [facility] area)  Relationship::     Contact Information:     Housing/Transportation Living arrangements for the past 2 months:  Justin [name] of Information:  Spouse Patient Interpreter Needed:  None Criminal Activity/Legal Involvement Pertinent to Current Situation/Hospitalization:  No - Comment as needed Significant Relationships:  Spouse Lives with:  Spouse Do you feel safe going back to the place where you live?  No (fall risk) Need for family participation in patient care:  Yes (Comment) (patient request)  Care giving concerns:  Wife expressed concern regarding patient not being able to provide care for himself once he is discharged if returning home.  PT is recommending CIR.   Social Worker assessment / plan:  CSW met with patient and patient's wife to review disposition.  Patient's wife is heavily involved in patient's care.  Patient has insurance "clerical error" where Justin [name] is showing patient as having no coverage though patient's employer is certain they have coverage.  Patient's wife is being patient's advocate to correct this insurance issue.  Patient and patient's wife is requesting CIR and state that if they would need to go to SNF, they would want to receive STR at [facility] in [place].  CSW received permission to secure this SNF bed for another option of disposition.  Of note, patient is projected to have another surgery on his leg prior to being discharged.    Employment status:   Retired Forensic scientist:    PT Recommendations:  Justin [name] / Referral to community resources:  Justin [name]  Patient/Family's Response to care:  Patient and patient's wife greatly appreciates both CIR and CSW assistance with disposition and understanding/not judging the insurance issue.  Patient/Family's Understanding of and Emotional Response to Diagnosis, Current Treatment, and Prognosis:  Patient and patient's wife are both very realistic regarding patient's prognosis and projected/needed therapies  Emotional Assessment Appearance:  Appears older than stated age Attitude/Demeanor/Rapport:   (appropriate) Affect (typically observed):  Accepting Orientation:  Oriented to Self, Oriented to Place, Oriented to  Time, Oriented to Situation Alcohol / Substance use:  Not Applicable Psych involvement (Current and /or in the community):  No (Comment)  Discharge Needs  Concerns to be addressed:  Other (Comment Required (insurance glitch- not showing coverage) Readmission within the last 30 days:    Current discharge risk:  None Barriers to Discharge:  No Barriers Identified   Dulcy Fanny, LCSW 03/29/2015, 2:03 PM

## 2015-03-29 NOTE — Progress Notes (Signed)
Rehab admissions - Evaluated for possible admission.  I met with patient and his wife.  Wife is retired and can assist.  I checked [employer] coverage by phone and policy was termed on 12/07/34.  I have left a message with patient and his wife to check on medical coverage since I cannot verify that patient has medical coverage.  Will follow along for now.  Call me for questions.  #[phone]

## 2015-03-29 NOTE — Clinical Social Work Placement (Signed)
   CLINICAL SOCIAL WORK PLACEMENT  NOTE  Date:  03/29/2015  Patient Details  Name: Justin [name] MRN: [id number] Date of Birth: [birth year]  Clinical Social Work is seeking post-discharge placement for this patient at the [place] level of care (*CSW will initial, date and re-position this form in  chart as items are completed):  Yes   Patient/family provided with [facility] Work Department's list of facilities offering this level of care within the geographic area requested by the patient (or if unable, by the patient's family).  Yes   Patient/family informed of their freedom to choose among providers that offer the needed level of care, that participate in Medicare, Medicaid or managed care program needed by the patient, have an available bed and are willing to accept the patient.  Yes   Patient/family informed of [facility]'s ownership interest in [facility] and [facility], as well as of the fact that they are under no obligation to receive care at these facilities.  PASRR submitted to EDS on 03/29/15     PASRR number received on 03/29/15     Existing PASRR number confirmed on       FL2 transmitted to all facilities in geographic area requested by pt/family on 03/29/15     FL2 transmitted to all facilities within larger geographic area on       Patient informed that his/her managed care company has contracts with or will negotiate with certain facilities, including the following:   (list provided)     Yes   Patient/family informed of bed offers received.  Patient chooses bed at  [facility] (If CIR is no longer an option))     Physician recommends and patient chooses bed at  (none)    Patient to be transferred to   on  .  Patient to be transferred to facility by       Patient family notified on   of transfer.  Name of family member notified:        PHYSICIAN       Additional Comment:     _______________________________________________ Dulcy Fanny, LCSW 03/29/2015, 2:08 PM

## 2015-03-29 NOTE — Progress Notes (Signed)
Physical Therapy Treatment Patient Details Name: Justin [name] MRN: [id number] DOB: [birth year] Today's Date: 03/29/2015    History of Present Illness 64 yo male with fall from ladder resulting in LLE open pilon fracture and left glenohumeral dislocation, s/p I&D external fixature revision left ankle with wound vac 6/9.  On 03/25/15, rapid response called due to change in status - CVA/TIA.  Patient without residual impairments.   PMH includes stroke and Bell's Palsy.     PT Comments    Patient continues to make good progress and is motivated to progress with therapy. Able to work on transfers with one assist. Continue to await word on if and when patient can attempt ambulation. Continue to recommend comprehensive inpatient rehab (CIR) for post-acute therapy needs.   Follow Up Recommendations  CIR     Equipment Recommendations  Other (comment) (TBD)    Recommendations for Other Services       Precautions / Restrictions Precautions Precautions: Fall;Shoulder Type of Shoulder Precautions: s/p anterior shoulder subluxation and reductions x 2, pendulums, gentle ROM L shoulder--abd and FF, may use elbow to hand freely, may use L UE for mobilization Shoulder Interventions: Shoulder sling/immobilizer (MD cleared to be off today per patient report) Required Braces or Orthoses: Other Brace/Splint Other Brace/Splint: External fixator on LLE  Restrictions LUE Weight Bearing: Non weight bearing LLE Weight Bearing: Non weight bearing    Mobility  Bed Mobility Overal bed mobility: Needs Assistance Bed Mobility: Supine to Sit     Supine to sit: Min guard     General bed mobility comments: [place] for safety. Patient with good positioning  Transfers Overall transfer level: Needs assistance Equipment used: None   Sit to Stand: Mod assist;+2 safety/equipment Stand pivot transfers: Mod assist;+2 safety/equipment       General transfer comment: Patient able to stand and pivot with  Mod A (plus person to ensure safety with transfer). Patient able to rise holding onto PTAs arm and shivel L foot around to recliner. Patient does transfer to his R side for ease and comfort  Ambulation/Gait             General Gait Details: unclear when can start ambulation due to L shoulder NWBing   Stairs            Wheelchair Mobility    Modified Rankin (Stroke Patients Only)       Balance                                    Cognition Arousal/Alertness: Awake/alert Behavior During Therapy: WFL for tasks assessed/performed Overall Cognitive Status: Within Functional Limits for tasks assessed       Memory: Decreased recall of precautions              Exercises General Exercises - Lower Extremity Long Arc Quad: Left;10 reps;Seated;AROM Hip Flexion/Marching: AROM;Left;10 reps    General Comments        Pertinent Vitals/Pain Faces Pain Scale: Hurts little more Pain Location: L shoulder Pain Descriptors / Indicators: Sore;Discomfort Pain Intervention(s): Limited activity within patient's tolerance;Repositioned    Home Living                      Prior Function            PT Goals (current goals can now be found in the care plan section) Progress towards PT goals: Progressing toward goals  Frequency  Min 5X/week    PT Plan Current plan remains appropriate    Co-evaluation             End of Session Equipment Utilized During Treatment: Gait belt Activity Tolerance: Patient tolerated treatment well Patient left: in chair;with call bell/phone within reach     Time: 1109-1127 PT Time Calculation (min) (ACUTE ONLY): 18 min  Charges:  $Therapeutic Activity: 8-22 mins                    G Codes:      Jacqualyn Posey 03/29/2015, 12:03 PM  03/29/2015 Jacqualyn Posey PTA [phone] pager [phone] office

## 2015-03-30 LAB — PROTHROMBIN GENE MUTATION

## 2015-03-30 LAB — FACTOR 5 LEIDEN

## 2015-03-30 MED ORDER — STROKE: EARLY STAGES OF RECOVERY BOOK
Freq: Once | Status: AC
  Administered 2015-03-30: 1
  Filled 2015-03-30: qty 1

## 2015-03-30 NOTE — Progress Notes (Addendum)
Rehab admissions - I continue to have discussions with patient's wife and [employer] representative who are working on getting the [employer].  Due to a clerical error, patient's BCBS plan is not active with [employer]BS right now.  This prohibits me from seeking authorization for acute inpatient rehab admission.  I have to be able to submit and get authorization from insurance carrier before I can admit to rehab.  I am unable to offer the patient a bed at this time.  Call me for questions.  #[phone]  I met with wife and patient.  They would like to admit to acute inpatient rehab even if we cannot get BCBS approval in a timely manner.  They are willing to admit as self pay and then we can seek retroactive approval from [place] once insurance is reinstated with [employer].  I will try to get a bed for patient in am tomorrow.  Call me for questions.  #[phone]

## 2015-03-30 NOTE — Progress Notes (Signed)
[facility] Progress Note  Subjective  Doing well No new complaints  Review of Systems  Constitutional: Negative for fever and chills.  Respiratory: Negative for shortness of breath and wheezing.   Cardiovascular: Negative for chest pain and palpitations.  Gastrointestinal: Negative for nausea, vomiting and abdominal pain.  Genitourinary: Negative for dysuria and urgency.  Skin: Positive for rash (posterior trunk ). Negative for itching.     Objective   BP 127/86 mmHg  Pulse 100  Temp(Src) 98.3 F (36.8 C) (Oral)  Resp 18  Ht 5\' 9"  (1.753 m)  Wt 85 kg (187 lb 6.3 oz)  BMI 27.66 kg/m2  SpO2 94%  Intake/Output      06/14 0701 - 06/15 0700 06/15 0701 - 06/16 0700   P.O. 720    Total Intake(mL/kg) 720 (8.5)    Urine (mL/kg/hr) 725 (0.4)    Stool 0 (0)    Total Output 725     Net -5          Urine Occurrence 1 x    Stool Occurrence 1 x      Labs  No new labs   Exam  Gen: awake and alert, resting comfortably, NAD Skin: maculopapular rash to posterior trunk, much improved  Lungs:clear anterior fields Cardiac: RRR, s1 and s2 Abd: +BS, NTND Ext:   Left Upper Extremity                            Motor and sensory functions grossly intact             Ext warm             + radial pulse               Elbow ROM improved              Pt tolerated gentle abduction and FF of shoulder        Left Lower Extremity               Ex fix stable and intact, dressing stable             Traumatic wound looks healthy, no evidence of tissue necrosis    No signs of active infection              scant drainage, sanguinous                        Moderate swelling still present to L lower extremity               Skin does not wrinkle with gentle compression             Lateral incision healing well               DPN, SPN, TN sensation grossly intact             Ext warm             EHL, FHL, lesser toe motor functions intact    Assessment and Plan    POD/HD#: 31   64 y/o RHD male s/p fall off ladder    1. Grade 3 open left distal tib-fib fracture status post I&D and external fixation revision, closed left anterior shoulder dislocation status post re-reduction              ex fix revised and repeat I&D performed  Flexible fibular nail placed and tibia pinned with k-wire               Shoulder reduced again and was stable with stressing in OR               CT reviewed, would likely benefit ORIF of distal tibia due to malalignment of posterior plafond,  posterior approach given fx pattern and traumatic wound.       Soft tissues not stable enough for ORIF tomorrow  Will recheck soft tissue on Monday                           WBAT L upper extremity with platform walker               NWB L LEx                      Gentle L shoulder motion- pendulums, abduction and FF             Unrestricted ROM L elbow, forearm, wrist and hand               Pt can use L arm to assist with mobilization               Continue with ice and elevation             pinsite care as needed  2. Pain management:             Continue with current management  3. ABL anemia/Hemodynamics             Stable    4. Medical issues              acute ischemia left medial occipital lobe, likely embolism to the left PCA.                         Per Stroke team                         Pt started on plavix as this is his 2nd cryptogenic stroke                           workup has been negative to date                         hypercoag labs still pending   5. DVT/PE prophylaxis:             continue lovenox   6. ID:               IV abx completed for open fracture treatment  7. Metabolic Bone Disease:             vitamin d levels borderline low                         Add vitamin d 3 1000 IUs bid                           Add vitamin d 2 50000 IUs daily x 8 weeks                           Hgb A1c  5.9%  8. Activity:             Activity as  tolerated             Non-Weightbearing left lower extremity  WBAT L upper extremity with platform                9. FEN/Foley/Lines:           reg diet as tolerated   10.Ex-fix/Splint care:             ok to manipulate extremity by fixator             Change pinsite dressings as needed  11. Skin rash             Possible drug rash             IV abx completed             lyrica stopped               currently on vistaril PRN for itching  Hold on hydrocortisone cream               12. Dispo:            CIR eval             Clarify insurance issues               SW eval for SNF in the event pt not approved for CIR    Stable for transfer to CIR if bed available     [name], PA-C [facility] [phone] ([phone] (O) 03/30/2015 9:00 AM

## 2015-03-30 NOTE — Progress Notes (Signed)
Physical Therapy Treatment Patient Details Name: Justin [name] MRN: [id number] DOB: 07-13-1951 Today's Date: 03/30/2015    History of Present Illness 64 yo male with fall from ladder resulting in LLE open pilon fracture and left glenohumeral dislocation, s/p I&D external fixature revision left ankle with wound vac 6/9.  On 03/25/15, rapid response called due to change in status - CVA/TIA.  Patient without residual impairments.   PMH includes stroke and Bell's Palsy.     PT Comments    Patient was cleared to use L PFRW and was able to work on some small hops. Limited some by fatigue after dressing change. Good adherence to [place] status. Continue to recommend CIR for ongoing therapy and return to functional independence prior to returning home. Patient was eager to start ambulation with use of PFRW  Follow Up Recommendations  CIR     Equipment Recommendations       Recommendations for Other Services       Precautions / Restrictions Precautions Precautions: Fall;Shoulder Type of Shoulder Precautions: s/p anterior shoulder subluxation and reductions x 2, pendulums, gentle ROM L shoulder--abd and FF, may use elbow to hand freely, may use L UE for mobilization and use PFRW Shoulder Interventions: Shoulder sling/immobilizer (Per notes, patient okay to use LUE for mobilization and gentle PROM/pendullum exercises) Precaution Comments: WBAT L upper extremity with platform - Per [name], PA Required Braces or Orthoses: Other Brace/Splint Other Brace/Splint: External fixator on LLE  Restrictions Weight Bearing Restrictions: Yes LUE Weight Bearing: Non weight bearing LLE Weight Bearing: Non weight bearing    Mobility  Bed Mobility Overal bed mobility: Needs Assistance Bed Mobility: Supine to Sit     Supine to sit: Supervision     General bed mobility comments: Up in recliner before and after session  Transfers Overall transfer level: Needs assistance Equipment used:  None Transfers: Sit to/from [facility] Sit to Stand: Min assist Stand pivot transfers: Min assist       General transfer comment: Min A to power up into standing and to ensure balance  Ambulation/Gait Ambulation/Gait assistance: Mod assist Ambulation Distance (Feet): 5 Feet Assistive device: Left platform walker Gait Pattern/deviations: Step-to pattern Gait velocity: guarded and cautious Gait velocity interpretation: Below normal speed for age/gender General Gait Details: Cues and A to ensure balance and proper use of L platform RW. Increased pain when bringing LUE off of RW   Stairs            Wheelchair Mobility    Modified Rankin (Stroke Patients Only)       Balance Overall balance assessment: Needs assistance Sitting-balance support: No upper extremity supported;Single extremity supported Sitting balance-Leahy Scale: Good     Standing balance support: No upper extremity supported Standing balance-Leahy Scale: Poor Standing balance comment: NWB LUE and LLE, LUE can be used for mobilization/functionally during transfers                    Cognition Arousal/Alertness: Awake/alert Behavior During Therapy: WFL for tasks assessed/performed Overall Cognitive Status: Within Functional Limits for tasks assessed                      Exercises Shoulder Exercises Shoulder Flexion: PROM;Left;10 reps;Seated (lab slides) Shoulder Extension: PROM;Left;10 reps;Seated (lap slides) Shoulder ABduction: PROM;Left;10 reps;Seated Elbow Flexion: AROM;Left;10 reps;Seated Elbow Extension: AROM;Left;10 reps;Seated Wrist Flexion: AROM;Left;10 reps;Seated Wrist Extension: AROM;Left;10 reps;Seated Digit Composite Flexion: AROM;Left;10 reps;Seated Composite Extension: AROM;Left;10 reps;Seated    General Comments  Pertinent Vitals/Pain Pain Assessment: Faces Faces Pain Scale: Hurts a little bit Pain Location: LUE, LLE Pain Descriptors /  Indicators: Discomfort Pain Intervention(s): Limited activity within patient's tolerance    Home Living                      Prior Function            PT Goals (current goals can now be found in the care plan section) Progress towards PT goals: Progressing toward goals    Frequency  Min 5X/week    PT Plan Current plan remains appropriate    Co-evaluation             End of Session   Activity Tolerance: Patient tolerated treatment well Patient left: in chair;with call bell/phone within reach     Time: 1000-1028 PT Time Calculation (min) (ACUTE ONLY): 28 min  Charges:  $Gait Training: 8-22 mins $Therapeutic Activity: 8-22 mins                    G Codes:      Jacqualyn Posey 03/30/2015, 11:55 AM 03/30/2015 Jacqualyn Posey PTA [phone] pager [phone] office

## 2015-03-30 NOTE — Progress Notes (Signed)
Occupational Therapy Treatment Patient Details Name: Justin [name] MRN: [id number] DOB: 1950/12/25 Today's Date: 03/30/2015    History of present illness 64 yo male with fall from ladder resulting in LLE open pilon fracture and left glenohumeral dislocation, s/p I&D external fixature revision left ankle with wound vac 6/9.  On 03/25/15, rapid response called due to change in status - CVA/TIA.  Patient without residual impairments.   PMH includes stroke and Bell's Palsy.    OT comments  Patient making progress towards OT goals, continue plan of care for now. Patient able to verbalize and adhere to NWB>LUE and LLE during functional transfers. According to [name], PA's note, patient able to WBAT through LUE for functional transfers/mobilization using PFRW. Educated patient on LUE HEP (light PROM > shoulder and AROM elbow to distally). Patient transferred onto shower bench and nursing staff made aware, as MD wants patient to shower today. Patient continues to be appropriate candidate for CIR.    Follow Up Recommendations  CIR    Equipment Recommendations  3 in 1 bedside comode;Hospital bed;Wheelchair (measurements OT);Wheelchair cushion (measurements OT);Other (comment) (any other TBD next venue of care)    Recommendations for Other Services  None at this time   Precautions / Restrictions Precautions Precautions: Fall;Shoulder Type of Shoulder Precautions: s/p anterior shoulder subluxation and reductions x 2, pendulums, gentle ROM L shoulder--abd and FF, may use elbow to hand freely, may use L UE for mobilization Shoulder Interventions: Shoulder sling/immobilizer (Per notes, patient okay to use LUE for mobilization and gentle PROM/pendullum exercises) Precaution Comments: WBAT L upper extremity with platform - Per [name], PA Required Braces or Orthoses: Other Brace/Splint Other Brace/Splint: External fixator on LLE  Restrictions Weight Bearing Restrictions: Yes LUE Weight Bearing:  Non weight bearing LLE Weight Bearing: Non weight bearing    Mobility Bed Mobility Overal bed mobility: Needs Assistance Bed Mobility: Supine to Sit     Supine to sit: Supervision     General bed mobility comments: Supervision for safety, HOB elevated, bed rails used prn  Transfers Overall transfer level: Needs assistance Equipment used: None Transfers: Sit to/from [facility] Sit to Stand: Mod assist Stand pivot transfers: Min assist       General transfer comment: Mod assist for lift/lower. Cues for technique and sequencing    Balance Overall balance assessment: Needs assistance Sitting-balance support: No upper extremity supported;Single extremity supported Sitting balance-Leahy Scale: Good     Standing balance support: No upper extremity supported Standing balance-Leahy Scale: Poor Standing balance comment: NWB LUE and LLE, LUE can be used for mobilization/functionally during transfers   ADL Overall ADL's : Needs assistance/impaired Tub/ Shower Transfer: Walk-in shower;Moderate assistance;Tub bench;Stand-pivot;Grab bars     General ADL Comments: Per Patient, RN, and PT, patient to shower today. Assisted patient with transfer > recliner, assisted pt into BR for shower stall transfer onto shower bench. Educated patient on LUE HEP - see below. Left patient seated on bench for nursing staff to assist with shower.       Vision Additional Comments: R inferior quadrinopsia          Cognition   Behavior During Therapy: WFL for tasks assessed/performed Overall Cognitive Status: Within Functional Limits for tasks assessed      Exercises Shoulder Exercises Shoulder Flexion: PROM;Left;10 reps;Seated (lab slides) Shoulder Extension: PROM;Left;10 reps;Seated (lap slides) Shoulder ABduction: PROM;Left;10 reps;Seated Elbow Flexion: AROM;Left;10 reps;Seated Elbow Extension: AROM;Left;10 reps;Seated Wrist Flexion: AROM;Left;10 reps;Seated Wrist Extension:  AROM;Left;10 reps;Seated Digit Composite Flexion: AROM;Left;10 reps;Seated  Composite Extension: AROM;Left;10 reps;Seated           Pertinent Vitals/ Pain       Pain Assessment: Faces Faces Pain Scale: Hurts a little bit Pain Location: LUE, LLE Pain Descriptors / Indicators: Discomfort Pain Intervention(s): Monitored during session;Repositioned         Frequency Min 2X/week     Progress Toward Goals  OT Goals(current goals can now be found in the care plan section)  Progress towards OT goals: Progressing toward goals     Plan Discharge plan remains appropriate       End of Session Equipment Utilized During Treatment: Gait belt   Activity Tolerance Patient tolerated treatment well   Patient Left  (on shower bench, RN and NT aware)   Nurse Communication Other (comment) (Patient on shower bench and nursing staff to assist with shower)    Time: 7782-4235 OT Time Calculation (min): 15 min  Charges: OT General Charges $OT Visit: 1 Procedure OT Treatments $Self Care/Home Management : 8-22 mins  Shirleyann Montero , MS, OTR/L, CLT Pager: [phone]  03/30/2015, 10:42 AM

## 2015-03-31 ENCOUNTER — Inpatient Hospital Stay (HOSPITAL_COMMUNITY)
Admission: RE | Admit: 2015-03-31 | Discharge: 2015-04-07 | DRG: 065 | Disposition: A | Discharge status: Home Health | Payer: BLUE CROSS/BLUE SHIELD | Source: Intra-hospital | Attending: Physical Medicine & Rehabilitation | Admitting: Physical Medicine & Rehabilitation

## 2015-03-31 ENCOUNTER — Encounter (HOSPITAL_COMMUNITY): Payer: Self-pay | Admitting: Orthopedic Surgery

## 2015-03-31 DIAGNOSIS — D62 Acute posthemorrhagic anemia: Secondary | ICD-10-CM | POA: Diagnosis present

## 2015-03-31 DIAGNOSIS — S43005D Unspecified dislocation of left shoulder joint, subsequent encounter: Secondary | ICD-10-CM

## 2015-03-31 DIAGNOSIS — S43005A Unspecified dislocation of left shoulder joint, initial encounter: Secondary | ICD-10-CM

## 2015-03-31 DIAGNOSIS — S82872F Displaced pilon fracture of left tibia, subsequent encounter for open fracture type IIIA, IIIB, or IIIC with routine healing: Secondary | ICD-10-CM

## 2015-03-31 DIAGNOSIS — I6340 Cerebral infarction due to embolism of unspecified cerebral artery: Principal | ICD-10-CM | POA: Diagnosis present

## 2015-03-31 DIAGNOSIS — S8292XS Unspecified fracture of left lower leg, sequela: Secondary | ICD-10-CM | POA: Diagnosis not present

## 2015-03-31 DIAGNOSIS — S82202S Unspecified fracture of shaft of left tibia, sequela: Secondary | ICD-10-CM | POA: Diagnosis not present

## 2015-03-31 DIAGNOSIS — S43005S Unspecified dislocation of left shoulder joint, sequela: Secondary | ICD-10-CM

## 2015-03-31 DIAGNOSIS — S82402S Unspecified fracture of shaft of left fibula, sequela: Secondary | ICD-10-CM | POA: Diagnosis not present

## 2015-03-31 DIAGNOSIS — S82202B Unspecified fracture of shaft of left tibia, initial encounter for open fracture type I or II: Secondary | ICD-10-CM | POA: Diagnosis present

## 2015-03-31 DIAGNOSIS — S82202H Unspecified fracture of shaft of left tibia, subsequent encounter for open fracture type I or II with delayed healing: Secondary | ICD-10-CM | POA: Diagnosis not present

## 2015-03-31 DIAGNOSIS — M25562 Pain in left knee: Secondary | ICD-10-CM | POA: Diagnosis not present

## 2015-03-31 DIAGNOSIS — S82402B Unspecified fracture of shaft of left fibula, initial encounter for open fracture type I or II: Secondary | ICD-10-CM

## 2015-03-31 DIAGNOSIS — S82872S Displaced pilon fracture of left tibia, sequela: Secondary | ICD-10-CM

## 2015-03-31 HISTORY — DX: Unspecified dislocation of left shoulder joint, initial encounter: S43.005A

## 2015-03-31 MED ORDER — ALUM & MAG HYDROXIDE-SIMETH 200-200-20 MG/5ML PO SUSP
30.0000 mL | ORAL | Status: DC | PRN
Start: 2015-03-31 — End: 2015-04-07

## 2015-03-31 MED ORDER — SENNOSIDES-DOCUSATE SODIUM 8.6-50 MG PO TABS
2.0000 | ORAL_TABLET | Freq: Every day | ORAL | Status: DC
  Administered 2015-03-31 – 2015-04-06 (×7): 2 via ORAL
  Filled 2015-03-31 (×8): qty 2

## 2015-03-31 MED ORDER — OXYCODONE HCL 5 MG PO TABS
5.0000 mg | ORAL_TABLET | ORAL | Status: DC | PRN
Start: 2015-03-31 — End: 2015-04-07
  Administered 2015-03-31 – 2015-04-07 (×25): 10 mg via ORAL
  Filled 2015-03-31 (×25): qty 2

## 2015-03-31 MED ORDER — CLOPIDOGREL BISULFATE 75 MG PO TABS
75.0000 mg | ORAL_TABLET | Freq: Every day | ORAL | Status: DC
  Administered 2015-04-01 – 2015-04-07 (×7): 75 mg via ORAL
  Filled 2015-03-31 (×8): qty 1

## 2015-03-31 MED ORDER — ONDANSETRON HCL 4 MG PO TABS: 4.0000 mg | ORAL_TABLET | Freq: Four times a day (QID) | ORAL | Status: DC | PRN

## 2015-03-31 MED ORDER — ENOXAPARIN SODIUM 40 MG/0.4ML ~~LOC~~ SOLN
40.0000 mg | SUBCUTANEOUS | Status: DC
  Administered 2015-04-01 – 2015-04-06 (×6): 40 mg via SUBCUTANEOUS
  Filled 2015-03-31 (×7): qty 0.4

## 2015-03-31 MED ORDER — FLEET ENEMA 7-19 GM/118ML RE ENEM: 1.0000 | ENEMA | Freq: Once | RECTAL | Status: AC | PRN

## 2015-03-31 MED ORDER — TRAMADOL HCL 50 MG PO TABS
50.0000 mg | ORAL_TABLET | Freq: Four times a day (QID) | ORAL | Status: DC | PRN
  Administered 2015-04-01 – 2015-04-07 (×13): 50 mg via ORAL
  Filled 2015-03-31 (×13): qty 1

## 2015-03-31 MED ORDER — SIMVASTATIN 10 MG PO TABS
10.0000 mg | ORAL_TABLET | Freq: Every day | ORAL | Status: DC
  Administered 2015-03-31 – 2015-04-06 (×7): 10 mg via ORAL
  Filled 2015-03-31 (×9): qty 1

## 2015-03-31 MED ORDER — METHOCARBAMOL 500 MG PO TABS
500.0000 mg | ORAL_TABLET | Freq: Four times a day (QID) | ORAL | Status: DC | PRN
  Administered 2015-04-01 – 2015-04-02 (×2): 500 mg via ORAL
  Administered 2015-04-02: 1000 mg via ORAL
  Administered 2015-04-02: 500 mg via ORAL
  Administered 2015-04-03 – 2015-04-04 (×5): 1000 mg via ORAL
  Administered 2015-04-04 – 2015-04-07 (×9): 500 mg via ORAL
  Filled 2015-03-31 (×4): qty 1
  Filled 2015-03-31 (×2): qty 2
  Filled 2015-03-31 (×2): qty 1
  Filled 2015-03-31 (×2): qty 2
  Filled 2015-03-31 (×6): qty 1
  Filled 2015-03-31 (×2): qty 2
  Filled 2015-03-31: qty 1

## 2015-03-31 MED ORDER — TRAZODONE HCL 50 MG PO TABS
25.0000 mg | ORAL_TABLET | Freq: Every evening | ORAL | Status: DC | PRN
  Filled 2015-03-31: qty 1

## 2015-03-31 MED ORDER — ENOXAPARIN SODIUM 40 MG/0.4ML ~~LOC~~ SOLN
40.0000 mg | SUBCUTANEOUS | Status: DC
Start: 2015-03-31 — End: 2015-03-31

## 2015-03-31 MED ORDER — ACETAMINOPHEN 325 MG PO TABS: 650.0000 mg | ORAL_TABLET | Freq: Four times a day (QID) | ORAL | Status: DC | PRN

## 2015-03-31 MED ORDER — DIPHENHYDRAMINE HCL 12.5 MG/5ML PO ELIX
12.5000 mg | ORAL_SOLUTION | Freq: Four times a day (QID) | ORAL | Status: DC | PRN
Start: 2015-03-31 — End: 2015-04-07

## 2015-03-31 MED ORDER — GUAIFENESIN-DM 100-10 MG/5ML PO SYRP: 5.0000 mL | ORAL_SOLUTION | Freq: Four times a day (QID) | ORAL | Status: DC | PRN

## 2015-03-31 MED ORDER — ONDANSETRON HCL 4 MG/2ML IJ SOLN: 4.0000 mg | Freq: Four times a day (QID) | INTRAMUSCULAR | Status: DC | PRN

## 2015-03-31 MED ORDER — BISACODYL 10 MG RE SUPP: 10.0000 mg | Freq: Every day | RECTAL | Status: DC | PRN

## 2015-03-31 NOTE — Progress Notes (Signed)
Justin Staggers, MD Physician Signed Physical Medicine and Rehabilitation Consult Note 03/28/2015 8:12 AM  Related encounter: ED to Hosp-Admission (Current) from 03/22/2015 in [place] Collapse All        Physical Medicine and Rehabilitation Consult  Reason for Consult: Left tib/fib fracture, left shoulder dislocation, embolic bilateral CVA Referring Physician: Dr. Marcelino [name].    HPI: Justin [name] is a 64 y.o. male with history of TIA, Left parietal infarct 04/2013 s/p loop recorder implantation, PFO, Bell's palsy; who fell off a ladder on 03/22/15 with subsequent left grade 3A open left pilon fibula/ tibia fracture and left anterior shoulder dislocation. No LOC, CP or palpitations reported prior to fall. He was taken to OR emergently for left shoulder close reduction as well as I & D left distal Tib-Fib with placement of external fixator. Dr. Marcelino [name] consulted for input and patient taken to OR on 06/09 for I & D with ORIF left pilon, tibia and Fibula as well as CR of left shoulder redislocation. To be NWB LUE/LLE. Post op, patient noted to be lethargic with delayed responses as well as visual deficits. Stat CT head done revealing acute left PCA territory infarct. MRI/MRA brain done revealing acute medial left occipital lobe infarct with additional smaller cortical infarcts bilateral occipital lobes and scattered cortical/white matter infarcts in watershed distribution bilaterally. No significant intracranial stenosis/ atherosclerosis noted. Carotid dopplers without significant ICA stenosis. BLE dopplers negative for DVT. Cardiac echo with EF 65% with no wall abnormality.   Neurology consulted for input and recommended changing ASA to plavix for secondary stroke prevention of Embolic CVA of questionable etiology. Loop recorder without evidence of A Fib. Justin [name] has discussed the question of PFO closure/compassionate use of FDA study device  after healing from fractures. Visual changes improving and lethargy has resolved. MRI left shoulder done for evaluation and revealed avulsion of glenoid attache ment of inferior glenohumeral ligament and multiple muscle strains around the shoulder. He has been cleared to start gentle ROM left shoulder today. External fixator with VAC in place on LLE with plans for definitive surgery in the next 10-14 days. Therapy ongoing and CIR recommended for by MD and Rehab team.    Review of Systems  Constitutional: Negative for malaise/fatigue.  HENT: Negative for hearing loss.  Eyes: Negative for blurred vision and double vision.   Right lower quadrant field cut  Respiratory: Negative for cough and shortness of breath.  Cardiovascular: Negative for chest pain and palpitations.  Gastrointestinal: Negative for heartburn, nausea and constipation.  Genitourinary: Negative for urgency and frequency.  Musculoskeletal: Positive for joint pain. Negative for myalgias.  Neurological: Negative for dizziness, tingling, sensory change, focal weakness and headaches.  Endo/Heme/Allergies: Does not bruise/bleed easily.  Psychiatric/Behavioral: The patient has insomnia.      Past Medical History  Diagnosis Date  . Bell's palsy 1980's; [id number]; 2000's; 12/2012; 03/2013    "multiple times" (04/21/2013)  . TIA (transient ischemic attack) 04/21/2013  . Stroke   . Hypercholesteremia     Past Surgical History  Procedure Laterality Date  . Tee without cardioversion N/A 04/24/2013    Procedure: TRANSESOPHAGEAL ECHOCARDIOGRAM (TEE); Surgeon: Thayer Headings, MD; Location: [facility]; Service: Cardiovascular; Laterality: N/A;  . Implanted loop recorder    . Colonoscopy N/A 07/13/2013    Procedure: COLONOSCOPY; Surgeon: Danie Binder, MD; [facility]; Service: Endoscopy; Laterality: N/A; 8:30 AM  . Loop recorder implant N/A 04/24/2013  Procedure: LOOP RECORDER IMPLANT; Surgeon: Deboraha Sprang, MD; Location: [facility]; Service: Cardiovascular; Laterality: N/A;  . Shoulder closed reduction Left 03/22/2015    Procedure: CLOSED REDUCTION SHOULDER; Surgeon: Rod Can, MD; Location: [place]; Service: Orthopedics; Laterality: Left;  . External fixation leg Left 03/22/2015    Procedure: EXTERNAL FIXATION left ankle; Surgeon: Rod Can, MD; Location: [place]; Service: Orthopedics; Laterality: Left;  . I&d extremity Left 03/22/2015    Procedure: IRRIGATION AND DEBRIDEMENT OPEN TIBIA -FIBULA FRACTURE; Surgeon: Rod Can, MD; Location: [place]; Service: Orthopedics; Laterality: Left;  . External fixation leg Left 03/24/2015    Procedure: IRRIGATION AND DEBRIDEMENT EXTERNAL FIXATURE REVISION LEFT ANKLE ; Surgeon: Altamese DeKalb, MD; Location: [place]; Service: Orthopedics; Laterality: Left;    Family History  Problem Relation Age of Onset  . Heart disease Mother   . Alcohol abuse Mother   . Colon cancer Neg Hx     Social History: Married. Works in Engineer, technical sales. He reports that he has never smoked. He has never used smokeless tobacco. He reports that he drinks a glass of wine on rare occasion. He reports that he does not use illicit drugs.    Allergies: No Known Allergies    Medications Prior to Admission  Medication Sig Dispense Refill  . aspirin 325 MG tablet Take 325 mg by mouth daily.    . simvastatin (ZOCOR) 10 MG tablet Take 10 mg by mouth daily.    Marland Kitchen acyclovir (ZOVIRAX) 800 MG tablet Take 1 tablet (800 mg total) by mouth 4 (four) times daily. (Patient not taking: Reported on 03/22/2015) 20 tablet 1  . aspirin 325 MG tablet Take 1 tablet (325 mg total) by mouth daily. (Patient not taking: Reported on 03/22/2015) 30 tablet 1  . predniSONE (DELTASONE) 10 MG tablet 6 tabs on day 1, 5 tabs on day 2, 4 tabs on day 3, 3 tabs on day 4, 2 tabs on  day 5, and 1 tab on day 6. (Patient not taking: Reported on 03/22/2015) 21 tablet 0  . simvastatin (ZOCOR) 10 MG tablet Take 1 tablet (10 mg total) by mouth at bedtime. (Patient not taking: Reported on 03/22/2015) 90 tablet 3  . zoster vaccine live, PF, (ZOSTAVAX) 07371 UNT/0.65ML injection Inject 19,400 Units into the skin once. (Patient not taking: Reported on 02/21/2015) 1 each 0    Home: Home Living Family/patient expects to be discharged to:: Private residence Living Arrangements: Spouse/significant other Available Help at Discharge: Family, Available 24 hours/day Type of Home: Apartment Home Access: Stairs to enter Technical brewer of Steps: 2 Entrance Stairs-Rails: Right Home Layout: One level Home Equipment: Shower seat - built in  Functional History: Prior Function Level of Independence: Independent Functional Status:  Mobility: Bed Mobility Overal bed mobility: Needs Assistance Bed Mobility: Supine to Sit Supine to sit: Min assist, +2 for safety/equipment Sit to supine: Min assist General bed mobility comments: Cues for technique. Assist to initiate movement with LLE, and to steady trunk during transition. Once upright, patient with good sitting balance. Transfers Overall transfer level: Needs assistance Equipment used: None Transfers: Squat Pivot Transfers Squat pivot transfers: Min assist, +2 safety/equipment General transfer comment: Verbal cues for NWB LUE/LLE, and for technique. Assist to rise to standing and for balance initially. Assist to steady during pivot to chair. Patient able to maintain NWB as instructed.      ADL:    Cognition: Cognition Overall Cognitive Status: Within Functional Limits for tasks assessed Orientation Level: Oriented X4 Cognition Arousal/Alertness: Awake/alert Behavior  During Therapy: WFL for tasks assessed/performed Overall Cognitive Status: Within Functional Limits for tasks assessed Memory: Decreased  recall of precautions  Blood pressure 148/89, pulse 111, temperature 98.3 F (36.8 C), temperature source Oral, resp. rate 20, height 5\' 9"  (1.753 m), weight 85 kg (187 lb 6.3 oz), SpO2 95 %. Physical Exam  Nursing note and vitals reviewed. Constitutional: He is oriented to person, place, and time. He appears well-developed and well-nourished.  HENT:  Head: Normocephalic and atraumatic.  Mouth/Throat: Oropharynx is clear and moist.  Eyes: Conjunctivae are normal. Pupils are equal, round, and reactive to light.  Neck: Normal range of motion. Neck supple.  Cardiovascular: Normal rate and regular rhythm.  Respiratory: Effort normal and breath sounds normal. No respiratory distress. He has no wheezes.  GI: Soft. Bowel sounds are normal. He exhibits no distension. There is no tenderness.  Musculoskeletal: He exhibits tenderness (left shoulder with movement).  External fixator on LLE. Minimal edema with ace wrap on ankle.  Neurological: He is alert and oriented to person, place, and time. A cranial nerve deficit is present.  Speech clear. Able to follow commands without difficulty. Finger to nose intact RUE. Right lower quandrantonopia. RUE and RLE strength grossly 4 to 5/5. Sensation appears grossly intact. Has good insight and awareness Skin: Skin is warm and dry. Rash noted.  Diffuse macular papular rash on the back.  Psychiatric: He has a normal mood and affect. His behavior is normal. Judgment and thought content normal.     Lab Results Last 24 Hours    Results for orders placed or performed during the hospital encounter of 03/22/15 (from the past 24 hour(s))  CBC Status: Abnormal   Collection Time: 03/28/15 3:03 AM  Result Value Ref Range   WBC 7.3 4.0 - 10.5 K/uL   RBC 3.48 (L) 4.22 - 5.81 MIL/uL   Hemoglobin 10.0 (L) 13.0 - 17.0 g/dL   HCT 30.0 (L) 39.0 - 52.0 %   MCV 86.2 78.0 - 100.0 fL   MCH 28.7 26.0 - 34.0 pg   MCHC 33.3 30.0 -  36.0 g/dL   RDW 13.9 11.5 - 15.5 %   Platelets 235 150 - 400 K/uL      Imaging Results (Last 48 hours)    No results found.    Assessment/Plan: Diagnosis: embolic bilateral CVA's, left tib-fib fx, and left shoulder dislocation 1. Does the need for close, 24 hr/day medical supervision in concert with the patient's rehab needs make it unreasonable for this patient to be served in a less intensive setting? Yes 2. Co-Morbidities requiring supervision/potential complications: pain mgt, wound care 3. Due to bladder management, bowel management, safety, skin/wound care, disease management, medication administration, pain management and patient education, does the patient require 24 hr/day rehab nursing? Yes 4. Does the patient require coordinated care of a physician, rehab nurse, PT (1-2 hrs/day, 5 days/week) and OT (1-2 hrs/day, 5 days/week) to address physical and functional deficits in the context of the above medical diagnosis(es)? Yes Addressing deficits in the following areas: balance, endurance, locomotion, strength, transferring, bowel/bladder control, bathing, dressing, feeding, grooming, toileting and psychosocial support 5. Can the patient actively participate in an intensive therapy program of at least 3 hrs of therapy per day at least 5 days per week? Yes 6. The potential for patient to make measurable gains while on inpatient rehab is excellent 7. Anticipated functional outcomes upon discharge from inpatient rehab are supervision and min assist with PT, supervision and min assist with OT, n/a with  SLP. 8. Estimated rehab length of stay to reach the above functional goals is: 10-15 days 9. Does the patient have adequate social supports and living environment to accommodate these discharge functional goals? Yes 10. Anticipated D/C setting: Home 11. Anticipated post D/C treatments: HH therapy and Outpatient therapy 12. Overall Rehab/Functional Prognosis:  excellent  RECOMMENDATIONS: This patient's condition is appropriate for continued rehabilitative care in the following setting: CIR Patient has agreed to participate in recommended program. Yes Note that insurance prior authorization may be required for reimbursement for recommended care.  Comment: Could bring to rehab and return to acute for removal/revision of ex-fix with subsequent discharge to home afterwards. Rehab Admissions Coordinator to follow up.  Thanks,  Justin Staggers, MD, [facility]     03/28/2015       Revision History     Date/Time User Provider Type Action   03/28/2015 9:46 AM Justin Staggers, MD Physician Sign   03/28/2015 9:28 AM Bary Leriche, PA-C Physician Assistant Share   View Details Report       Routing History     Date/Time From To Method   03/28/2015 9:46 AM Justin Staggers, MD Justin Staggers, MD In Basket   03/28/2015 9:46 AM Justin Staggers, MD [name], MD In [place]

## 2015-03-31 NOTE — Progress Notes (Signed)
Received pt. As a transfer from [place].Pt. And family were oriented to the unit routine.Safety plan was explained,fall prevention plan was explained and sign by pt. And RN.Keep assessing pt. Closely.

## 2015-03-31 NOTE — Progress Notes (Signed)
Called report to RN and transport to [facility] rm 10

## 2015-03-31 NOTE — Progress Notes (Signed)
Physical Therapy Treatment Patient Details Name: Justin [name] MRN: [id number] DOB: June 21, 1951 Today's Date: 03/31/2015    History of Present Illness 64 yo male with fall from ladder resulting in LLE open pilon fracture and left glenohumeral dislocation, s/p I&D external fixature revision left ankle with wound vac 6/9.  On 03/25/15, rapid response called due to change in status - CVA/TIA.  Patient without residual impairments.   PMH includes stroke and Bell's Palsy.     PT Comments    Patient requires outside assistance for balance and ambulation. Fatigues quickly. Discussed CIR with patient. He is excited to get started and gain some functional independence. Continue to recommend comprehensive inpatient rehab (CIR) for post-acute therapy needs.   Follow Up Recommendations  CIR     Equipment Recommendations       Recommendations for Other Services       Precautions / Restrictions Precautions Precautions: Fall;Shoulder Type of Shoulder Precautions: s/p anterior shoulder subluxation and reductions x 2, pendulums, gentle ROM L shoulder--abd and FF, may use elbow to hand freely, may use L UE for mobilization and use PFRW Precaution Comments: WBAT L upper extremity with platform - Per [name], PA Required Braces or Orthoses: Other Brace/Splint Restrictions Weight Bearing Restrictions: Yes LUE Weight Bearing: Non weight bearing LLE Weight Bearing: Non weight bearing    Mobility  Bed Mobility Overal bed mobility: Needs Assistance       Supine to sit: Supervision        Transfers Overall transfer level: Needs assistance Equipment used: None   Sit to Stand: Min assist         General transfer comment: Min A to power up into standing and to ensure balance  Ambulation/Gait Ambulation/Gait assistance: Min assist Ambulation Distance (Feet): 25 Feet   Gait Pattern/deviations: Step-to pattern Gait velocity: guarded and cautious Gait velocity interpretation: Below  normal speed for age/gender General Gait Details: Cues and A to ensure balance and proper use of L platform RW. Increased pain when bringing LUE off of RW   Stairs            Wheelchair Mobility    Modified Rankin (Stroke Patients Only)       Balance     Sitting balance-Leahy Scale: Good       Standing balance-Leahy Scale: Fair Standing balance comment: Requires UE support for balance. Mod A without UE support                    Cognition Arousal/Alertness: Awake/alert Behavior During Therapy: WFL for tasks assessed/performed Overall Cognitive Status: Within Functional Limits for tasks assessed       Memory: Decreased recall of precautions              Exercises      General Comments        Pertinent Vitals/Pain Faces Pain Scale: Hurts a little bit Pain Location: LUE LLE Pain Descriptors / Indicators: Discomfort;Aching Pain Intervention(s): Limited activity within patient's tolerance    Home Living                      Prior Function            PT Goals (current goals can now be found in the care plan section) Progress towards PT goals: Progressing toward goals    Frequency  Min 5X/week    PT Plan Current plan remains appropriate    Co-evaluation  End of Session Equipment Utilized During Treatment: Gait belt Activity Tolerance: Patient tolerated treatment well Patient left: in chair;with call bell/phone within reach     Time: 1005-1028 PT Time Calculation (min) (ACUTE ONLY): 23 min  Charges:  $Gait Training: 8-22 mins $Therapeutic Activity: 8-22 mins                    G Codes:      Jacqualyn Posey 03/31/2015, 10:38 AM  03/31/2015 Jacqualyn Posey PTA [phone] pager [phone] office

## 2015-03-31 NOTE — Progress Notes (Signed)
Rehab admissions - I spoke with patient's wife this am.  She tells me that [place] has been reinstated per [employer].  However, I am unable to verify with [employer].  Wife is agreeable with proceeding with inpatient rehab admission.  Bed available and will admit to acute inpatient rehab today.  Call me for questions.  #[phone]

## 2015-03-31 NOTE — Progress Notes (Signed)
Retta Diones, RN Rehab Admission Coordinator Signed Physical Medicine and Rehabilitation PMR Pre-admission 03/31/2015 10:39 AM  Related encounter: ED to Hosp-Admission (Current) from 03/22/2015 in [place] Collapse All   PMR Admission Coordinator Pre-Admission Assessment  Patient: BLADE SCHEFF is an 64 y.o., male MRN: [id number] DOB: June 19, 1951 Height: 5\' 9"  (175.3 cm) Weight: 85 kg (187 lb 6.3 oz)  Insurance Information Currently self pay until clerical error with [employer] gets resolved.  HMO: PPO: PCP: IPA: 80/20: OTHER:  PRIMARY: BCBS Policy#: [id number] Subscriber: Patience Musca CM Name: Phone#: Fax#: [phone] Pre-Cert#: Employer: [employer] Benefits: Phone #: [phone] Name:  Eff. Date: Deduct: Out of Pocket Max: Life Max:  CIR: SNF:  Outpatient: Co-Pay:  Home Health: Co-Pay:  DME: Co-Pay:  Providers:   Emergency Contact Information Contact Information    Name Relation Home Work [place] Spouse [phone]  [phone]   Hyman, Crossan [phone]  [phone]   [facility] Daughter [phone]  [phone]     Current Medical History  Patient Admitting Diagnosis: Embolic bilateral CVA's, left tib-fib fx, and left shoulder dislocation  History of Present Illness: A 64 y.o. male with history of TIA, left parietal infarct 04/2013 s/p loop recorder implantation, PFO, Bell's palsy; who fell off a ladder on 03/22/15 with subsequent left grade 3A open left pilon fibula/ tibia fracture and left anterior shoulder dislocation. No LOC, CP or palpitations reported  prior to fall. He was taken to OR emergently for left shoulder close reduction as well as I & D left distal Tib-Fib with placement of external fixator. Dr. Marcelino Scot consulted for input and patient taken to OR on 06/09 for I & D with ORIF left pilon, tibia and Fibula as well as CR of left shoulder redislocation. To be NWB LUE/LLE. Post op, patient noted to be lethargic with delayed responses as well as visual deficits. Stat CT head done revealing acute left PCA territory infarct. MRI/MRA brain done revealing acute medial left occipital lobe infarct with additional smaller cortical infarcts bilateral occipital lobes and scattered cortical/white matter infarcts in watershed distribution bilaterally. No significant intracranial stenosis/ atherosclerosis noted. Carotid dopplers without significant ICA stenosis. BLE dopplers negative for DVT. Cardiac echo with EF 65% with no wall abnormality.   Neurology consulted for input and recommended changing ASA to plavix for secondary stroke prevention of Embolic CVA of questionable etiology. Loop recorder without evidence of A Fib. Dr. Leonie Man has discussed the question of PFO closure/compassionate use of FDA study device after healing from fractures. Visual changes improving and lethargy has resolved. MRI left shoulder done for evaluation and revealed avulsion of glenoid attache ment of inferior glenohumeral ligament and multiple muscle strains around the shoulder. He has been cleared to start gentle ROM left shoulder today. External fixator in place on LLE with plans for definitive surgery in the next 10-14 days. Note: Dr Marcelino Scot and Lanny Hurst, [place] plan to look at patient's ankle on Monday, 04/03/15, to further determine timing of upcoming ankle surgery. Therapy ongoing and CIR recommended for by MD and Rehab team.    Total: 2=NIH  Past Medical History  Past Medical History  Diagnosis Date  . Bell's palsy 1980's; [id number]; 2000's; 12/2012; 03/2013    "multiple times"  (04/21/2013)  . TIA (transient ischemic attack) 04/21/2013  . Stroke   . Hypercholesteremia   . Dislocation of left shoulder joint 03/31/2015    Family History  family history includes Alcohol abuse in his  mother; Heart disease in his mother. There is no history of Colon cancer.  Prior Rehab/Hospitalizations:  Has the patient had major surgery during 100 days prior to admission? No  Current Medications   Current facility-administered medications:  . [DISCONTINUED] acetaminophen (TYLENOL) tablet 650 mg, 650 mg, Oral, Q6H PRN **OR** acetaminophen (TYLENOL) suppository 650 mg, 650 mg, Rectal, Q6H PRN, Rod Can, MD . acetaminophen (TYLENOL) tablet 1,000 mg, 1,000 mg, Oral, Q6H, [name], PA-C, 1,000 mg at 03/31/15 0932 . clopidogrel (PLAVIX) tablet 75 mg, 75 mg, Oral, Daily, Garvin Fila, MD, 75 mg at 03/31/15 0932 . docusate sodium (COLACE) capsule 100 mg, 100 mg, Oral, BID, Rod Can, MD, 100 mg at 03/31/15 0932 . enoxaparin (LOVENOX) injection 40 mg, 40 mg, Subcutaneous, Q24H, [name], PA-C, 40 mg at 03/30/15 1801 . HYDROmorphone (DILAUDID) injection 0.5-1 mg, 0.5-1 mg, Intravenous, Q2H PRN, [name], PA-C, 1 mg at 03/25/15 3570 . methocarbamol (ROBAXIN) 1,000 mg in dextrose 5 % 50 mL IVPB, 1,000 mg, Intravenous, Q6H PRN, [name], PA-C . methocarbamol (ROBAXIN) tablet 500-1,000 mg, 500-1,000 mg, Oral, Q6H PRN, [name], PA-C, 500 mg at 03/31/15 0631 . metoCLOPramide (REGLAN) tablet 5-10 mg, 5-10 mg, Oral, Q8H PRN **OR** metoCLOPramide (REGLAN) injection 5-10 mg, 5-10 mg, Intravenous, Q8H PRN, Rod Can, MD, 10 mg at 03/23/15 1823 . ondansetron (ZOFRAN) tablet 4 mg, 4 mg, Oral, Q6H PRN **OR** ondansetron (ZOFRAN) injection 4 mg, 4 mg, Intravenous, Q6H PRN, Rod Can, MD, 4 mg at 03/23/15 1232 . oxyCODONE (Oxy IR/ROXICODONE) immediate release tablet 5-15 mg, 5-15 mg, Oral, Q4H PRN, [name], PA-C, 10 mg at 03/31/15 0631 . senna  (SENOKOT) tablet 8.6 mg, 1 tablet, Oral, BID, Rod Can, MD, 8.6 mg at 03/31/15 0932 . simvastatin (ZOCOR) tablet 10 mg, 10 mg, Oral, q1800, Rod Can, MD, 10 mg at 03/30/15 1801  Patients Current Diet: Diet regular Room service appropriate?: Yes; Fluid consistency:: Thin  Precautions / Restrictions Precautions Precautions: Fall, Shoulder Type of Shoulder Precautions: s/p anterior shoulder subluxation and reductions x 2, pendulums, gentle ROM L shoulder--abd and FF, may use elbow to hand freely, may use L UE for mobilization and use PFRW Precaution Booklet Issued: No Precaution Comments: WBAT L upper extremity with platform - Per [name], PA Other Brace/Splint: External fixator on LLE  Restrictions Weight Bearing Restrictions: Yes LUE Weight Bearing: Non weight bearing LLE Weight Bearing: Non weight bearing   Has the patient had 2 or more falls or a fall with injury in the past year?No. However, patient did have a fall which resulted in injury and was then admitted to the acute hospital on 03/22/15.  Prior Activity Level Community (5-7x/wk): Went out daily. Works as Art gallery manager. Was driving.  Home Assistive Devices / Equipment Home Assistive Devices/Equipment: None Home Equipment: Shower seat - built in  Prior Device Use: Indicate devices/aids used by the patient prior to current illness, exacerbation or injury? None of the above  Prior Functional Level Prior Function Level of Independence: Independent Comments: pt is employed full time  Self Care: Did the patient need help bathing, dressing, using the toilet or eating? Independent  Indoor Mobility: Did the patient need assistance with walking from room to room (with or without device)? Independent  Stairs: Did the patient need assistance with internal or external stairs (with or without device)? Independent  Functional Cognition: Did the patient need help planning regular tasks  such as shopping or remembering to take medications? Independent  Current Functional Level  Cognition  Overall Cognitive Status: Within Functional Limits for tasks assessed Orientation Level: Oriented X4   Extremity Assessment (includes Sensation/Coordination)  Upper Extremity Assessment: RUE deficits/detail, LUE deficits/detail RUE Deficits / Details: WNL LUE Deficits / Details: full AROM elbow to hand LUE: Unable to fully assess due to immobilization LUE Coordination: decreased gross motor  Lower Extremity Assessment: Defer to PT evaluation LLE Deficits / Details: limited assessment due to external fixator. good volitional movement of first 2 digits. Minimal movement in lateral 3 digits. some numbness reported. LLE Sensation: decreased light touch    ADLs  Overall ADL's : Needs assistance/impaired Eating/Feeding: Independent, Sitting Grooming: Wash/dry face, Wash/dry hands, Set up, Sitting Upper Body Bathing: Sitting, Moderate assistance Lower Body Bathing: Maximal assistance, Sit to/from stand Upper Body Dressing : Minimal assistance, Sitting Lower Body Dressing: Maximal assistance, Sit to/from stand Toilet Transfer: +2 for physical assistance, Minimal assistance, Stand-pivot, BSC Toileting- Clothing Manipulation and Hygiene: Total assistance, Sit to/from stand, +2 for physical assistance Tub/ Shower Transfer: Walk-in shower, Moderate assistance, Tub bench, Stand-pivot, Grab bars General ADL Comments: Per Patient, RN, and PT, patient to shower today. Assisted patient with transfer > recliner, assisted pt into BR for shower stall transfer onto shower bench. Educated patient on LUE HEP - see below. Left patient seated on bench for nursing staff to assist with shower.     Mobility  Overal bed mobility: Needs Assistance Bed Mobility: Supine to Sit Supine to sit: Supervision Sit to supine: Min assist General bed mobility comments: Up in recliner before and after session     Transfers  Overall transfer level: Needs assistance Equipment used: None Transfers: Sit to/from Stand, Stand Pivot Transfers Sit to Stand: Min assist Stand pivot transfers: Min assist Squat pivot transfers: Min assist, +2 safety/equipment General transfer comment: Min A to power up into standing and to ensure balance    Ambulation / Gait / Stairs / Wheelchair Mobility  Ambulation/Gait Ambulation/Gait assistance: Museum/gallery curator (Feet): 25 Feet Assistive device: Left platform walker General Gait Details: Cues and A to ensure balance and proper use of L platform RW. Increased pain when bringing LUE off of RW Gait Pattern/deviations: Step-to pattern Gait velocity: guarded and cautious Gait velocity interpretation: Below normal speed for age/gender    Posture / Balance Balance Overall balance assessment: Needs assistance Sitting-balance support: No upper extremity supported, Single extremity supported Sitting balance-Leahy Scale: Good Standing balance support: No upper extremity supported Standing balance-Leahy Scale: Fair Standing balance comment: Requires UE support for balance. Mod A without UE support    Special needs/care consideration BiPAP/CPAP No  CPM No Continuous Drip IV No Dialysis No  Life Vest No Oxygen No Special Bed No Trach Size No Wound Vac (area) No  Skin Has a external fixator on left lower leg with dressings at pin sites. Has a rash on his back.  Bowel mgmt: Last BM 03/31/15 Bladder mgmt: Voiding in urinal Diabetic mgmt No    Previous Home Environment Living Arrangements: Spouse/significant other Available Help at Discharge: Family, Available 24 hours/day Type of Home: Apartment Home Layout: Two level Home Access: Stairs to enter Entrance Stairs-Rails: Right Entrance Stairs-Number of Steps: 2 Bathroom Shower/Tub: Multimedia programmer: Standard Home Care Services:  No  Discharge Living Setting Plans for Discharge Living Setting: Patient's home, House, Lives with (comment) (Lives with wife.) Type of Home at Discharge: House Discharge Home Layout: Two level, Able to live on main level with bedroom/bathroom Alternate Level Stairs-Number of Steps: Flight Discharge Home Access:  Stairs to enter [employer] of Steps: 2 small steps garage entry and 3 small steps at front entrance Does the patient have any problems obtaining your medications?: No  Social/Family/Support Systems Patient Roles: Spouse, Parent (Has a wife and a son.) Contact Information: Kelsen Celona - wife (h) [phone]) [phone] Anticipated Caregiver: wife Ability/Limitations of Caregiver: Wife is retired and can assist. Careers adviser: 24/7 Discharge Plan Discussed with Primary Caregiver: Yes Is Caregiver In Agreement with Plan?: Yes Does Caregiver/Family have Issues with Lodging/Transportation while Pt is in Rehab?: No  Goals/Additional Needs Patient/Family Goal for Rehab: PT/OT supervision to min assist goals Expected length of stay: 10-15 days Cultural Considerations: None Dietary Needs: Regular diet, thin liquids Equipment Needs: TBD Pt/Family Agrees to Admission and willing to participate: Yes Program Orientation Provided & Reviewed with Pt/Caregiver Including Roles & Responsibilities: Yes  Decrease burden of Care through IP rehab admission: N/A  Possible need for SNF placement upon discharge: Not anticipated  Patient Condition: This patient's medical and functional status has changed since the consult dated: 03/28/15 in which the Rehabilitation Physician determined and documented that the patient's condition is appropriate for intensive rehabilitative care in an inpatient rehabilitation facility. See "History of Present Illness" (above) for medical update. Functional changes are: Currently requiring min assist to ambulate 25 ft L PFRW. Patient's  medical and functional status update has been discussed with the Rehabilitation physician and patient remains appropriate for inpatient rehabilitation. Will admit to inpatient rehab today.  Preadmission Screen Completed By: Retta Diones, 03/31/2015 11:06 AM ______________________________________________________________________  Discussed status with Dr. Letta Pate on 03/31/15 at 35 and received telephone approval for admission today.  Admission Coordinator: Retta Diones, time1106/Date06/16/16          Cosigned by: Charlett Blake, MD at 03/31/2015 11:51 AM  Revision History     Date/Time User Provider Type Action   03/31/2015 11:51 AM Charlett Blake, MD Physician Cosign   03/31/2015 11:08 AM Retta Diones, RN Rehab Admission Coordinator Sign

## 2015-03-31 NOTE — H&P (Signed)
Physical Medicine and Rehabilitation Admission H&P   Chief Complaint  Patient presents with  . Embolic B-CVA, left shoulder dislocation, left tib-fib fracture.    HPI: Justin [name] is a 64 y.o. male with history of TIA, Left parietal infarct 04/2013 s/p loop recorder implantation, PFO, Bell's palsy; who fell off a ladder on 03/22/15 with subsequent left grade 3A open left pilon fibula/ tibia fracture and left anterior shoulder dislocation. No LOC, CP or palpitations reported prior to fall. He was taken to OR emergently for left shoulder close reduction as well as I & D left distal Tib-Fib with placement of external fixator. Dr. Marcelino Scot consulted for input and patient taken to OR on 06/09 for I & D with ORIF left pilon, tibia and Fibula as well as CR of left shoulder redislocation. To be NWB LUE/LLE. Post op, patient noted to be lethargic with delayed responses as well as visual deficits. Stat CT head done revealing acute left PCA territory infarct. MRI/MRA brain done revealing acute medial left occipital lobe infarct with additional smaller cortical infarcts bilateral occipital lobes and scattered cortical/white matter infarcts in watershed distribution bilaterally. No significant intracranial stenosis/ atherosclerosis noted. Carotid dopplers without significant ICA stenosis. BLE dopplers negative for DVT. Cardiac echo with EF 65% with no wall abnormality.   Neurology consulted for input and recommended changing ASA to plavix for secondary stroke prevention of Embolic CVA of questionable etiology. Loop recorder without evidence of A Fib. Dr. Leonie Man has discussed the question of PFO closure/compassionate use of FDA study device after healing from fractures. Visual changes improving and lethargy has resolved. MRI left shoulder done for evaluation and revealed avulsion of glenoid attache ment of inferior glenohumeral ligament and multiple muscle strains around the shoulder. He has been cleared to  start gentle ROM left shoulder and to start WBAT LUE today. External fixator in place on LLE with plans for definitive surgery in the next 10-14 days. Has completed IV antibiotics for open fracture. He developed a questionable drug rash that is resolving. Therapy ongoing and CIR recommended for by MD and Rehab team.   PMR consult performed, pt felt to be an appropriate candidate for CIR  Review of Systems  HENT: Negative for hearing loss.  Eyes: Negative for blurred vision and double vision.   Still has vision deficits right lower quadrent  Respiratory: Negative for cough and shortness of breath.  Cardiovascular: Negative for chest pain and palpitations.  Gastrointestinal: Negative for nausea, vomiting and constipation.  Genitourinary: Negative for dysuria, urgency and frequency.  Musculoskeletal: Positive for joint pain. Negative for myalgias.  Neurological: Negative for dizziness, sensory change, speech change, weakness and headaches.  Psychiatric/Behavioral: Negative for depression and memory loss. The patient is not nervous/anxious.      Past Medical History  Diagnosis Date  . Bell's palsy 1980's; [id number]; 2000's; 12/2012; 03/2013    "multiple times" (04/21/2013)  . TIA (transient ischemic attack) 04/21/2013  . Stroke   . Hypercholesteremia   . Dislocation of left shoulder joint 03/31/2015    Past Surgical History  Procedure Laterality Date  . Tee without cardioversion N/A 04/24/2013    Procedure: TRANSESOPHAGEAL ECHOCARDIOGRAM (TEE); Surgeon: Thayer Headings, MD; Location: [place]; Service: Cardiovascular; Laterality: N/A;  . Implanted loop recorder    . Colonoscopy N/A 07/13/2013    Procedure: COLONOSCOPY; Surgeon: Danie Binder, MD; Location: [facility]; Service: Endoscopy; Laterality: N/A; 8:30 AM  . Loop recorder implant N/A 04/24/2013    Procedure: LOOP RECORDER IMPLANT; Surgeon:  Deboraha Sprang, MD;  Location: [facility] CATH LAB; Service: Cardiovascular; Laterality: N/A;  . Shoulder closed reduction Left 03/22/2015    Procedure: CLOSED REDUCTION SHOULDER; Surgeon: Rod Can, MD; Location: [place]; Service: Orthopedics; Laterality: Left;  . External fixation leg Left 03/22/2015    Procedure: EXTERNAL FIXATION left ankle; Surgeon: Rod Can, MD; Location: [place]; Service: Orthopedics; Laterality: Left;  . I&d extremity Left 03/22/2015    Procedure: IRRIGATION AND DEBRIDEMENT OPEN TIBIA -FIBULA FRACTURE; Surgeon: Rod Can, MD; Location: [place]; Service: Orthopedics; Laterality: Left;  . External fixation leg Left 03/24/2015    Procedure: IRRIGATION AND DEBRIDEMENT EXTERNAL FIXATURE REVISION LEFT ANKLE ; Surgeon: Altamese Randsburg, MD; Location: [place]; Service: Orthopedics; Laterality: Left;    Family History  Problem Relation Age of Onset  . Heart disease Mother   . Alcohol abuse Mother   . Colon cancer Neg Hx     Social History: Married. Works in Engineer, technical sales. He reports that he has never smoked. He has never used smokeless tobacco. He reports that he drinks a glass of wine on rare occasion. He reports that he does not use illicit drugs   Allergies  Allergen Reactions  . Lyrica [Pregabalin]     Edema     Medications Prior to Admission  Medication Sig Dispense Refill  . aspirin 325 MG tablet Take 325 mg by mouth daily.    . simvastatin (ZOCOR) 10 MG tablet Take 10 mg by mouth daily.    Marland Kitchen acyclovir (ZOVIRAX) 800 MG tablet Take 1 tablet (800 mg total) by mouth 4 (four) times daily. (Patient not taking: Reported on 03/22/2015) 20 tablet 1  . aspirin 325 MG tablet Take 1 tablet (325 mg total) by mouth daily. (Patient not taking: Reported on 03/22/2015) 30 tablet 1  . predniSONE (DELTASONE) 10 MG tablet 6 tabs on day 1, 5 tabs on day 2, 4 tabs on day 3, 3 tabs on day 4, 2 tabs on day 5, and 1 tab on  day 6. (Patient not taking: Reported on 03/22/2015) 21 tablet 0  . simvastatin (ZOCOR) 10 MG tablet Take 1 tablet (10 mg total) by mouth at bedtime. (Patient not taking: Reported on 03/22/2015) 90 tablet 3  . zoster vaccine live, PF, (ZOSTAVAX) 78295 UNT/0.65ML injection Inject 19,400 Units into the skin once. (Patient not taking: Reported on 02/21/2015) 1 each 0    Home: Home Living Family/patient expects to be discharged to:: Private residence Living Arrangements: Spouse/significant other Available Help at Discharge: Family, Available 24 hours/day Type of Home: Apartment Home Access: Stairs to enter Technical brewer of Steps: 2 Entrance Stairs-Rails: Right Home Layout: Two level Home Equipment: Shower seat - built in  Functional History: Prior Function Level of Independence: Independent Comments: pt is employed full time  Functional Status:  Mobility: Bed Mobility Overal bed mobility: Needs Assistance Bed Mobility: Supine to Sit Supine to sit: Supervision Sit to supine: Min assist General bed mobility comments: Up in recliner before and after session Transfers Overall transfer level: Needs assistance Equipment used: None Transfers: Sit to/from Stand, Technical brewer Transfers Sit to Stand: Min assist Stand pivot transfers: Min assist Squat pivot transfers: Min assist, +2 safety/equipment General transfer comment: Min A to power up into standing and to ensure balance Ambulation/Gait Ambulation/Gait assistance: Min assist Ambulation Distance (Feet): 25 Feet Assistive device: Left platform walker General Gait Details: Cues and A to ensure balance and proper use of L platform RW. Increased pain when bringing LUE off of RW Gait Pattern/deviations: Step-to  pattern Gait velocity: guarded and cautious Gait velocity interpretation: Below normal speed for age/gender    ADL: ADL Overall ADL's : Needs assistance/impaired Eating/Feeding: Independent,  Sitting Grooming: Wash/dry face, Wash/dry hands, Set up, Sitting Upper Body Bathing: Sitting, Moderate assistance Lower Body Bathing: Maximal assistance, Sit to/from stand Upper Body Dressing : Minimal assistance, Sitting Lower Body Dressing: Maximal assistance, Sit to/from stand Toilet Transfer: +2 for physical assistance, Minimal assistance, Stand-pivot, BSC Toileting- Clothing Manipulation and Hygiene: Total assistance, Sit to/from stand, +2 for physical assistance Tub/ Shower Transfer: Walk-in shower, Moderate assistance, Tub bench, Stand-pivot, Grab bars General ADL Comments: Per Patient, RN, and PT, patient to shower today. Assisted patient with transfer > recliner, assisted pt into BR for shower stall transfer onto shower bench. Educated patient on LUE HEP - see below. Left patient seated on bench for nursing staff to assist with shower.   Cognition: Cognition Overall Cognitive Status: Within Functional Limits for tasks assessed Orientation Level: Oriented X4 Cognition Arousal/Alertness: Awake/alert Behavior During Therapy: WFL for tasks assessed/performed Overall Cognitive Status: Within Functional Limits for tasks assessed Memory: Decreased recall of precautions   Blood pressure 131/72, pulse 107, temperature 98 F (36.7 C), temperature source Oral, resp. rate 18, height 5\' 9"  (1.753 m), weight 85 kg (187 lb 6.3 oz), SpO2 96 %. Physical Exam  Nursing note and vitals reviewed. Constitutional: He is oriented to person, place, and time. He appears well-developed and well-nourished.  HENT:  Head: Normocephalic and atraumatic.  Eyes: Conjunctivae are normal. Pupils are equal, round, and reactive to light.  Neck: Normal range of motion. Neck supple.  Cardiovascular: Normal rate and regular rhythm.  No murmur heard. Respiratory: Effort normal and breath sounds normal. No respiratory distress. He has no wheezes.  GI: Soft. Bowel sounds are normal. He exhibits no distension.  There is no tenderness.  Musculoskeletal:  External fixator on Left ankle with dry dressing. NV intact. Left shoulder pain with gentle ROM.  Neurological: He is alert and oriented to person, place, and time.  Speech clear. Able to follow commands without difficulty. Finger to nose intact RUE. Right lower [place]onopia. RUE and RLE strength grossly 4 to 5/5. Sensation appears grossly intact. Has good insight and awareness  Skin: Skin is warm and dry. Rash (Rash on back now with scabs---welts resolved. ) noted.  Psychiatric: He has a normal mood and affect. His behavior is normal. Judgment and thought content normal.     Lab Results Last 48 Hours    No results found for this or any previous visit (from the past 48 hour(s)).    Imaging Results (Last 48 hours)    No results found.       Medical Problem List and Plan: 1. Functional deficits secondary to embolic bilateral CVA's, left tib-fib fx, and left shoulder dislocation 2. DVT Prophylaxis/Anticoagulation: Pharmaceutical: Lovenox 3. Pain Management: Continue oxycodone prn with robaxin as needed for muscle spasms.  4. Mood: LCSW to follow for evaluation and support.  5. Neuropsych: This patient is capable of making decisions on his own behalf. 6. Skin/Wound Care: Routine pin care bid.  7. Fluids/Electrolytes/Nutrition: Monitor I/O. Check labs in am. Po intake appears to be good.  8. ABLA: Monitor with routine checks.  9. Closed reduction of Left shoulder dislocation: WBAT LUE with platform walker with gentle L shoulder motion- pendulums, abduction and FF.Unrestricted ROM L elbow, forearm, wrist and hand . 10. Open distal Tib/Fib fracture s/p I & d with revision: NWB. LLE.  Post Admission Physician Evaluation: 1. Functional deficits secondary to embolic bilateral CVA's, left tib-fib fx, and left shoulder dislocation 2. Patient is admitted to receive collaborative, interdisciplinary care between the physiatrist, rehab  nursing staff, and therapy team. 3. Patient's level of medical complexity and substantial therapy needs in context of that medical necessity cannot be provided at a lesser intensity of care such as a SNF. 4. Patient has experienced substantial functional loss from his/her baseline which was documented above under the "Functional History" and "Functional Status" headings. Judging by the patient's diagnosis, physical exam, and functional history, the patient has potential for functional progress which will result in measurable gains while on inpatient rehab. These gains will be of substantial and practical use upon discharge in facilitating mobility and self-care at the household level. 5. Physiatrist will provide 24 hour management of medical needs as well as oversight of the therapy plan/treatment and provide guidance as appropriate regarding the interaction of the two. 6. 24 hour rehab nursing will assist with bladder management, bowel management, safety, skin/wound care, disease management, medication administration, pain management and patient education and help integrate therapy concepts, techniques,education, etc. 7. PT will assess and treat for/with: pre gait, gait training, endurance , safety, equipment, neuromuscular re education. Goals are: Mod I. 8. OT will assess and treat for/with: ADLs, Cognitive perceptual skills, Neuromuscular re education, safety, endurance, equipment. Goals are: Sup/Min A. Therapy may not proceed with showering this patient. 9. SLP will assess and treat for/with: NA. Goals are: NA. 10. Case Management and Social Worker will assess and treat for psychological issues and discharge planning. 11. Team conference will be held weekly to assess progress toward goals and to determine barriers to discharge. 12. Patient will receive at least 3 hours of therapy per day at least 5 days per week. 13. ELOS: 10-14d  14. Prognosis: excellent     Charlett Blake  M.D. [facility] FAAPM&R (Sports Med, Neuromuscular Med) Diplomate Am Board of Electrodiagnostic Med  03/31/2015

## 2015-03-31 NOTE — Progress Notes (Signed)
Orthopaedic Trauma Service Progress Note  Subjective  Doing well No complaints Ready to go to rehabilitation  ROS  As above  Objective   BP 130/77 mmHg  Pulse 96  Temp(Src) 98.4 F (36.9 C) (Oral)  Resp 18  Ht 5\' 9"  (1.753 m)  Wt 85 kg (187 lb 6.3 oz)  BMI 27.66 kg/m2  SpO2 95%  Intake/Output      06/15 0701 - 06/16 0700 06/16 0701 - 06/17 0700   P.O. 720    Total Intake(mL/kg) 720 (8.5)    Urine (mL/kg/hr) 510 (0.3)    Stool 0 (0)    Total Output 510     Net +210          Urine Occurrence 2 x    Stool Occurrence 1 x      Labs   No new labs  Exam  Gen: awake and alert, resting comfortably, NAD Skin: maculopapular rash to posterior trunk, much improved   Lungs:clear anterior fields Cardiac: RRR, s1 and s2 Abd: +BS, NTND Ext:   Left Upper Extremity                            Motor and sensory functions grossly intact             Ext warm             + radial pulse               Elbow ROM improved               Pt tolerated gentle abduction and FF of shoulder        Left Lower Extremity               Ex fix stable and intact, dressing stable                           Moderate swelling still present to L lower extremity                            DPN, SPN, TN sensation grossly intact             Ext warm             EHL, FHL, lesser toe motor functions intact    Assessment and Plan   POD/HD#: 71   64 y/o RHD male s/p fall off ladder    1. Grade 3 open left distal tib-fib fracture status post I&D and external fixation revision, closed left anterior shoulder dislocation status post re-reduction              ex fix revised and repeat I&D performed             Flexible fibular nail placed and tibia pinned with k-wire               Shoulder reduced again and was stable with stressing in OR               CT reviewed, would likely benefit ORIF of distal tibia due to malalignment of posterior plafond,  posterior approach given fx pattern and  traumatic wound.                              Will recheck soft  tissue on Monday                            WBAT L upper extremity with platform walker               NWB L LEx                      Gentle L shoulder motion- pendulums, abduction and FF             Unrestricted ROM L elbow, forearm, wrist and hand               Pt can use L arm to assist with mobilization               Continue with ice and elevation             pinsite care as needed  2. Pain management:             Continue with current management  3. ABL anemia/Hemodynamics             Stable    4. Medical issues              acute ischemia left medial occipital lobe, likely embolism to the left PCA.                         Per Stroke team                         Pt started on plavix as this is his 2nd cryptogenic stroke                           workup has been negative to date                         hypercoag labs still pending   5. DVT/PE prophylaxis:             continue lovenox   6. ID:               IV abx completed for open fracture treatment  7. Metabolic Bone Disease:             vitamin d levels borderline low                         Add vitamin d 3 1000 IUs bid                           Add vitamin d 2 50000 IUs daily x 8 weeks                           Hgb A1c 5.9%  8. Activity:             Activity as tolerated             Non-Weightbearing left lower extremity             WBAT L upper extremity with platform                 9. FEN/Foley/Lines:           reg diet as tolerated   10.Ex-fix/Splint  care:             ok to manipulate extremity by fixator             Change pinsite dressings as needed  11. Skin rash             Possible drug rash             IV abx completed             lyrica stopped               currently on vistaril PRN for itching             Hold on hydrocortisone cream                12. Dispo:                        Stable for transfer to CIR if bed available     Recheck soft tissues on Monday     [name], PA-C [facility] [phone] (O) 03/31/2015 9:26 AM

## 2015-03-31 NOTE — PMR Pre-admission (Signed)
PMR Admission Coordinator Pre-Admission Assessment  Patient: Justin [name] is an 64 y.o., male MRN: [id number] DOB: [birth year] Height: 5\' 9"  (175.3 cm) Weight: 85 kg (187 lb 6.3 oz)              Insurance Information Currently self pay until clerical error with [employer] gets resolved.  HMO:      PPO:       PCP:       IPA:       80/20:       OTHER:   PRIMARY: BCBS      Policy#: [id number]      Subscriber: Patience Musca CM Name:                                Phone#:                         Fax#: [phone] Pre-Cert#:        Employer: [employer] Benefits:  Phone #: [phone]     Name:   Eff. Date:       Deduct:        Out of Pocket Max:        Life Max:   CIR:        SNF:   Outpatient:       Co-Pay:   Home Health:        Co-Pay:   DME:       Co-Pay:   Providers:    Emergency Contact Information Contact Information    Name Relation Home Work [place] Spouse [phone]  [phone]   Krishay, [place] [phone]  [phone]   [facility] Daughter [phone]  [phone]     Current Medical History  Patient Admitting Diagnosis: Embolic bilateral CVA's, left tib-fib fx, and left shoulder dislocation  History of Present Illness: A 64 y.o. male with history of TIA, left parietal infarct 04/2013 s/p loop recorder implantation, PFO, Bell's palsy; who fell off a ladder on 03/22/15 with subsequent left grade 3A open left pilon fibula/ tibia fracture and left anterior shoulder dislocation. No LOC, CP or palpitations reported prior to fall. He was taken to OR emergently for left shoulder close reduction as well as I & D left distal Tib-Fib with placement of external fixator. Dr. Marcelino Scot consulted for input and patient taken to OR on 06/09 for I & D with ORIF left pilon, tibia and Fibula as well as CR of left shoulder redislocation. To be NWB LUE/LLE. Post op, patient noted to be lethargic with delayed responses as well as visual deficits. Stat CT head done  revealing acute left PCA territory infarct. MRI/MRA brain done revealing acute medial left occipital lobe infarct with additional smaller cortical infarcts bilateral occipital lobes and scattered cortical/white matter infarcts in watershed distribution bilaterally. No significant intracranial stenosis/ atherosclerosis noted. Carotid dopplers without significant ICA stenosis. BLE dopplers negative for DVT. Cardiac echo with EF 65% with no wall abnormality.   Neurology consulted for input and recommended changing ASA to plavix for secondary stroke prevention of Embolic CVA of questionable etiology. Loop recorder without evidence of A Fib. Dr. Leonie Man has discussed the question of PFO closure/compassionate use of FDA study device after healing from fractures. Visual changes improving and lethargy has resolved. MRI left shoulder done for evaluation and revealed avulsion of glenoid attache ment of inferior glenohumeral ligament and multiple muscle strains around the shoulder.  He has been cleared to start gentle ROM left shoulder today. External fixator in place on LLE with plans for definitive surgery in the next 10-14 days. Note:  Dr Marcelino Scot and Lanny Hurst, [place] plan to look at patient's ankle on Monday, 04/03/15, to further determine timing of upcoming ankle surgery.  Therapy ongoing and CIR recommended for by MD and Rehab team.     Total: 2=NIH  Past Medical History  Past Medical History  Diagnosis Date  . Bell's palsy 1980's; [id number]; 2000's; 12/2012; 03/2013    "multiple times" (04/21/2013)  . TIA (transient ischemic attack) 04/21/2013  . Stroke   . Hypercholesteremia   . Dislocation of left shoulder joint 03/31/2015    Family History  family history includes Alcohol abuse in his mother; Heart disease in his mother. There is no history of Colon cancer.  Prior Rehab/Hospitalizations:  Has the patient had major surgery during 100 days prior to admission? No  Current Medications   Current facility-administered  medications:  .  [DISCONTINUED] acetaminophen (TYLENOL) tablet 650 mg, 650 mg, Oral, Q6H PRN **OR** acetaminophen (TYLENOL) suppository 650 mg, 650 mg, Rectal, Q6H PRN, Rod Can, MD .  acetaminophen (TYLENOL) tablet 1,000 mg, 1,000 mg, Oral, Q6H, [name], PA-C, 1,000 mg at 03/31/15 0932 .  clopidogrel (PLAVIX) tablet 75 mg, 75 mg, Oral, Daily, Garvin Fila, MD, 75 mg at 03/31/15 0932 .  docusate sodium (COLACE) capsule 100 mg, 100 mg, Oral, BID, Rod Can, MD, 100 mg at 03/31/15 0932 .  enoxaparin (LOVENOX) injection 40 mg, 40 mg, Subcutaneous, Q24H, [name], PA-C, 40 mg at 03/30/15 1801 .  HYDROmorphone (DILAUDID) injection 0.5-1 mg, 0.5-1 mg, Intravenous, Q2H PRN, [name], PA-C, 1 mg at 03/25/15 1856 .  methocarbamol (ROBAXIN) 1,000 mg in dextrose 5 % 50 mL IVPB, 1,000 mg, Intravenous, Q6H PRN, [name], PA-C .  methocarbamol (ROBAXIN) tablet 500-1,000 mg, 500-1,000 mg, Oral, Q6H PRN, [name], PA-C, 500 mg at 03/31/15 0631 .  metoCLOPramide (REGLAN) tablet 5-10 mg, 5-10 mg, Oral, Q8H PRN **OR** metoCLOPramide (REGLAN) injection 5-10 mg, 5-10 mg, Intravenous, Q8H PRN, Rod Can, MD, 10 mg at 03/23/15 1823 .  ondansetron (ZOFRAN) tablet 4 mg, 4 mg, Oral, Q6H PRN **OR** ondansetron (ZOFRAN) injection 4 mg, 4 mg, Intravenous, Q6H PRN, Rod Can, MD, 4 mg at 03/23/15 1232 .  oxyCODONE (Oxy IR/ROXICODONE) immediate release tablet 5-15 mg, 5-15 mg, Oral, Q4H PRN, [name], PA-C, 10 mg at 03/31/15 0631 .  senna (SENOKOT) tablet 8.6 mg, 1 tablet, Oral, BID, Rod Can, MD, 8.6 mg at 03/31/15 0932 .  simvastatin (ZOCOR) tablet 10 mg, 10 mg, Oral, q1800, Rod Can, MD, 10 mg at 03/30/15 1801  Patients Current Diet: Diet regular Room service appropriate?: Yes; Fluid consistency:: Thin  Precautions / Restrictions Precautions Precautions: Fall, Shoulder Type of Shoulder Precautions: s/p anterior shoulder subluxation and reductions x 2, pendulums, gentle ROM L  shoulder--abd and FF, may use elbow to hand freely, may use L UE for mobilization and use PFRW Precaution Booklet Issued: No Precaution Comments: WBAT L upper extremity with platform - Per [name], PA Other Brace/Splint: External fixator on LLE  Restrictions Weight Bearing Restrictions: Yes LUE Weight Bearing: Non weight bearing LLE Weight Bearing: Non weight bearing   Has the patient had 2 or more falls or a fall with injury in the past year?No.  However, patient did have a fall which resulted in injury and was then admitted to the acute hospital on 03/22/15.  Prior Activity  Level Community (5-7x/wk): Went out daily.  Works as Art gallery manager.  Was driving.  Home Assistive Devices / Equipment Home Assistive Devices/Equipment: None Home Equipment: Shower seat - built in  Prior Device Use: Indicate devices/aids used by the patient prior to current illness, exacerbation or injury? None of the above  Prior Functional Level Prior Function Level of Independence: Independent Comments: pt is employed full time  Self Care: Did the patient need help bathing, dressing, using the toilet or eating?  Independent  Indoor Mobility: Did the patient need assistance with walking from room to room (with or without device)? Independent  Stairs: Did the patient need assistance with internal or external stairs (with or without device)? Independent  Functional Cognition: Did the patient need help planning regular tasks such as shopping or remembering to take medications? Independent  Current Functional Level Cognition  Overall Cognitive Status: Within Functional Limits for tasks assessed Orientation Level: Oriented X4    Extremity Assessment (includes Sensation/Coordination)  Upper Extremity Assessment: RUE deficits/detail, LUE deficits/detail RUE Deficits / Details: WNL LUE Deficits / Details: full AROM elbow to hand LUE: Unable to fully assess due to  immobilization LUE Coordination: decreased gross motor  Lower Extremity Assessment: Defer to PT evaluation LLE Deficits / Details: limited assessment due to external fixator. good volitional movement of first 2 digits. Minimal movement in lateral 3 digits. some numbness reported. LLE Sensation: decreased light touch    ADLs  Overall ADL's : Needs assistance/impaired Eating/Feeding: Independent, Sitting Grooming: Wash/dry face, Wash/dry hands, Set up, Sitting Upper Body Bathing: Sitting, Moderate assistance Lower Body Bathing: Maximal assistance, Sit to/from stand Upper Body Dressing : Minimal assistance, Sitting Lower Body Dressing: Maximal assistance, Sit to/from stand Toilet Transfer: +2 for physical assistance, Minimal assistance, Stand-pivot, BSC Toileting- Clothing Manipulation and Hygiene: Total assistance, Sit to/from stand, +2 for physical assistance Tub/ Shower Transfer: Walk-in shower, Moderate assistance, Tub bench, Stand-pivot, Grab bars General ADL Comments: Per Patient, RN, and PT, patient to shower today. Assisted patient with transfer > recliner, assisted pt into BR for shower stall transfer onto shower bench. Educated patient on LUE HEP - see below. Left patient seated on bench for nursing staff to assist with shower.     Mobility  Overal bed mobility: Needs Assistance Bed Mobility: Supine to Sit Supine to sit: Supervision Sit to supine: Min assist General bed mobility comments: Up in recliner before and after session    Transfers  Overall transfer level: Needs assistance Equipment used: None Transfers: Sit to/from Stand, Stand Pivot Transfers Sit to Stand: Min assist Stand pivot transfers: Min assist Squat pivot transfers: Min assist, +2 safety/equipment General transfer comment: Min A to power up into standing and to ensure balance    Ambulation / Gait / Stairs / Wheelchair Mobility  Ambulation/Gait Ambulation/Gait assistance: Museum/gallery curator  (Feet): 25 Feet Assistive device: Left platform walker General Gait Details: Cues and A to ensure balance and proper use of L platform RW. Increased pain when bringing LUE off of RW Gait Pattern/deviations: Step-to pattern Gait velocity: guarded and cautious Gait velocity interpretation: Below normal speed for age/gender    Posture / Balance Balance Overall balance assessment: Needs assistance Sitting-balance support: No upper extremity supported, Single extremity supported Sitting balance-Leahy Scale: Good Standing balance support: No upper extremity supported Standing balance-Leahy Scale: Fair Standing balance comment: Requires UE support for balance. Mod A without UE support    Special needs/care consideration BiPAP/CPAP No  CPM No Continuous Drip IV No  Dialysis No         Life Vest No Oxygen No Special Bed No Trach Size No Wound Vac (area) No    Skin Has a external fixator on left lower leg with dressings at pin sites.  Has a rash on his back.                               Bowel mgmt: Last BM 03/31/15 Bladder mgmt: Voiding in urinal Diabetic mgmt No    Previous Home Environment Living Arrangements: Spouse/significant other Available Help at Discharge: Family, Available 24 hours/day Type of Home: Apartment Home Layout: Two level Home Access: Stairs to enter Entrance Stairs-Rails: Right Entrance Stairs-Number of Steps: 2 Bathroom Shower/Tub: Multimedia programmer: [place]: No  Discharge Living Setting Plans for Discharge Living Setting: Patient's home, House, Lives with (comment) (Lives with wife.) Type of Home at Discharge: House Discharge Home Layout: Two level, Able to live on main level with bedroom/bathroom Alternate Level Stairs-Number of Steps: Flight Discharge Home Access: Stairs to enter [employer] of Steps: 2 small steps garage entry and 3 small steps at front entrance Does the patient have any problems obtaining  your medications?: No  Social/Family/Support Systems Patient Roles: Spouse, Parent (Has a wife and a son.) Contact Information: Areg Bialas - wife (h) [phone] (c) [phone] Anticipated Caregiver: wife Ability/Limitations of Caregiver: Wife is retired and can assist. Careers adviser: 24/7 Discharge Plan Discussed with Primary Caregiver: Yes Is Caregiver In Agreement with Plan?: Yes Does Caregiver/Family have Issues with Lodging/Transportation while Pt is in Rehab?: No  Goals/Additional Needs Patient/Family Goal for Rehab: PT/OT supervision to min assist goals Expected length of stay: 10-15 days Cultural Considerations: None Dietary Needs: Regular diet, thin liquids Equipment Needs: TBD Pt/Family Agrees to Admission and willing to participate: Yes Program Orientation Provided & Reviewed with Pt/Caregiver Including Roles  & Responsibilities: Yes  Decrease burden of Care through IP rehab admission: N/A  Possible need for SNF placement upon discharge: Not anticipated  Patient Condition: This patient's medical and functional status has changed since the consult dated: 03/28/15 in which the Rehabilitation Physician determined and documented that the patient's condition is appropriate for intensive rehabilitative care in an inpatient rehabilitation facility. See "History of Present Illness" (above) for medical update. Functional changes are:  Currently requiring min assist to ambulate 25 ft L PFRW. Patient's medical and functional status update has been discussed with the Rehabilitation physician and patient remains appropriate for inpatient rehabilitation. Will admit to inpatient rehab today.  Preadmission Screen Completed By:  Retta Diones, 03/31/2015 11:06 AM ______________________________________________________________________   Discussed status with Dr. Letta Pate on 03/31/15 at 74 and received telephone approval for admission today.  Admission Coordinator:  Retta Diones, time1106/Date06/16/16

## 2015-04-01 ENCOUNTER — Inpatient Hospital Stay (HOSPITAL_COMMUNITY): Payer: BLUE CROSS/BLUE SHIELD | Admitting: Speech Pathology

## 2015-04-01 ENCOUNTER — Inpatient Hospital Stay (HOSPITAL_COMMUNITY): Payer: BLUE CROSS/BLUE SHIELD

## 2015-04-01 ENCOUNTER — Inpatient Hospital Stay (HOSPITAL_COMMUNITY): Payer: BLUE CROSS/BLUE SHIELD | Admitting: Physical Therapy

## 2015-04-01 DIAGNOSIS — S8292XS Unspecified fracture of left lower leg, sequela: Secondary | ICD-10-CM

## 2015-04-01 NOTE — Progress Notes (Signed)
Patient information reviewed and entered into eRehab system by Namine Beahm, RN, CRRN, PPS Coordinator.  Information including medical coding and functional independence measure will be reviewed and updated through discharge.    

## 2015-04-01 NOTE — Evaluation (Signed)
Occupational Therapy Assessment and Plan  Patient Details  Name: Justin [name] MRN: [id number] Date of Birth: [birth year]  OT Diagnosis: muscle weakness (generalized) Rehab Potential: Rehab Potential (ACUTE ONLY): Excellent ELOS: 10-14 days   Today's Date: 04/01/2015 OT Individual Time: 1100-1200 OT Individual Time Calculation (min): 60 min     Problem List:  Patient Active Problem List   Diagnosis Date Noted  . Dislocation of left shoulder joint 03/31/2015  . Embolic cerebral infarction 03/31/2015  . Cerebral infarction due to embolism of cerebral artery   . Embolic stroke   . PFO (patent foramen ovale)   . Open displaced pilon fracture of left tibia, type III 03/23/2015  . Open fracture of left tibia and fibula 03/22/2015  . LINQ reveal 08/04/2013  . Mild hyperlipidemia 05/11/2013  . Colon cancer screening 05/11/2013  . H/O: CVA (cerebrovascular accident) 04/22/2013  . Bell's palsy 04/22/2013    Past Medical History:  Past Medical History  Diagnosis Date  . Bell's palsy 1980's; [id number]; 2000's; 12/2012; 03/2013    "multiple times" (04/21/2013)  . TIA (transient ischemic attack) 04/21/2013  . Stroke   . Hypercholesteremia   . Dislocation of left shoulder joint 03/31/2015   Past Surgical History:  Past Surgical History  Procedure Laterality Date  . Tee without cardioversion N/A 04/24/2013    Procedure: TRANSESOPHAGEAL ECHOCARDIOGRAM (TEE);  Surgeon: Thayer Headings, MD;  Location: [place] [facility];  Service: Cardiovascular;  Laterality: N/A;  . Implanted loop recorder    . Colonoscopy N/A 07/13/2013    Procedure: COLONOSCOPY;  Surgeon: Danie Binder, MD;  [facility];  Service: Endoscopy;  Laterality: N/A;  8:30 AM  . Loop recorder implant N/A 04/24/2013    Procedure: LOOP RECORDER IMPLANT;  Surgeon: Deboraha Sprang, MD;  Location: [facility] CATH LAB;  Service: Cardiovascular;  Laterality: N/A;  . Shoulder closed reduction Left 03/22/2015    Procedure: CLOSED REDUCTION  SHOULDER;  Surgeon: Rod Can, MD;  Location: [place];  Service: Orthopedics;  Laterality: Left;  . External fixation leg Left 03/22/2015    Procedure: EXTERNAL FIXATION left ankle;  Surgeon: Rod Can, MD;  Location: [place];  Service: Orthopedics;  Laterality: Left;  . I&d extremity Left 03/22/2015    Procedure: IRRIGATION AND DEBRIDEMENT OPEN TIBIA -FIBULA  FRACTURE;  Surgeon: Rod Can, MD;  Location: [place];  Service: Orthopedics;  Laterality: Left;  . External fixation leg Left 03/24/2015    Procedure: IRRIGATION AND DEBRIDEMENT  EXTERNAL FIXATURE REVISION LEFT ANKLE ;  Surgeon: Altamese , MD;  Location: [place];  Service: Orthopedics;  Laterality: Left;    Assessment & Plan Clinical Impression: Patient is a 65 y.o. male with history of TIA, left parietal infarct 04/2013 s/p loop recorder implantation, PFO, Bell's palsy; who fell off a ladder on 03/22/15 with subsequent left grade 3A open left pilon fibula/ tibia fracture and left anterior shoulder dislocation. No LOC, CP or palpitations reported prior to fall. He was taken to OR emergently for left shoulder close reduction as well as I & D left distal Tib-Fib with placement of external fixator. Dr. Marcelino Scot consulted for input and patient taken to OR on 06/09 for I & D with ORIF left pilon, tibia and Fibula as well as CR of left shoulder redislocation. To be NWB LUE/LLE. Post op, patient noted to be lethargic with delayed responses as well as visual deficits. Stat CT head done revealing acute left PCA territory infarct. MRI/MRA brain done revealing acute medial left occipital lobe  infarct with additional smaller cortical infarcts bilateral occipital lobes and scattered cortical/white matter infarcts in watershed distribution bilaterally. No significant intracranial stenosis/ atherosclerosis noted. Carotid dopplers without significant ICA stenosis. BLE dopplers negative for DVT. Cardiac echo with EF 65% with no wall abnormality.   Neurology  consulted for input and recommended changing ASA to plavix for secondary stroke prevention of Embolic CVA of questionable etiology. Loop recorder without evidence of A Fib. Dr. Leonie Man has discussed the question of PFO closure/compassionate use of FDA study device after healing from fractures. Visual changes improving and lethargy has resolved. MRI left shoulder done for evaluation and revealed avulsion of glenoid attache ment of inferior glenohumeral ligament and multiple muscle strains around the shoulder. He has been cleared to start gentle ROM left shoulder today. External fixator in place on LLE with plans for definitive surgery in the next 10-14 days. Note: Dr Marcelino Scot and Lanny Hurst, [place] plan to look at patient's ankle on Monday, 04/03/15, to further determine timing of upcoming ankle surgery. Therapy ongoing and CIR recommended for by MD and Rehab team. .  Patient transferred to CIR on 03/31/2015 .    Patient currently requires min assist with basic self-care skills secondary to muscle weakness.  Prior to hospitalization, patient could complete BADL/iADL independently.  Patient will benefit from skilled intervention to increase independence with basic self-care skills prior to discharge home with care partner.  Anticipate patient will require intermittent supervision and follow up outpatient.  OT - End of Session Activity Tolerance: Tolerates 30+ min activity with multiple rests Endurance Deficit: Yes OT Assessment Rehab Potential (ACUTE ONLY): Excellent OT Patient demonstrates impairments in the following area(s): Pain;Endurance;Balance OT Basic ADL's Functional Problem(s): Bathing;Dressing;Toileting OT Transfers Functional Problem(s): Toilet;Tub/Shower OT Additional Impairment(s): Fuctional Use of Upper Extremity OT Plan OT Intensity: Minimum of 1-2 x/day, 45 to 90 minutes OT Frequency: 5 out of 7 days OT Duration/Estimated Length of Stay: 10-14 days OT Treatment/Interventions: Therapeutic  Exercise;UE/LE Strength taining/ROM;Therapeutic Activities;Self Care/advanced ADL retraining;Patient/family education;Functional mobility training;DME/adaptive equipment instruction;Discharge planning OT Self Feeding Anticipated Outcome(s): Independent OT Basic Self-Care Anticipated Outcome(s): Mod I OT Toileting Anticipated Outcome(s): Mod I OT Bathroom Transfers Anticipated Outcome(s): Mod I OT Recommendation Patient destination: Home Follow Up Recommendations: None Equipment Recommended: To be determined   Skilled Therapeutic Intervention OT 1:1 initial eval completed with treatment provided to emphasize transfer training, adapted bathing and dressing skills, safety awareness and reinforcement of post-op precautions.   Pt able to ambulate with platform walker within room however required re-ed on sit<>stand and reinforcement training on effective use of DME.  Pt performed bathing and dressing at sink with overall min-mod assist due to left shoulder weakness and impaired reach to L-LE.   Spouse present throughout session.   Pt and spouse educated on ward routine, safety plan, and AD.   OT Evaluation Precautions/Restrictions  Precautions Precautions: Shoulder;Fall Type of Shoulder Precautions: s/p anterior shoulder subluxation and reductions x 2, pendulums, gentle ROM L shoulder--abd and FF, may use elbow to hand freely, may use L UE for mobilization and use PFRW Shoulder Interventions: Shoulder sling/immobilizer Precaution Booklet Issued: No Precaution Comments: WBAT L upper extremity with platform - Per [name], PA Required Braces or Orthoses: Other Brace/Splint Other Brace/Splint: External fixator on LLE  Restrictions Weight Bearing Restrictions: Yes LUE Weight Bearing: Weight bearing as tolerated LLE Weight Bearing: Non weight bearing  General Chart Reviewed: Yes Family/Caregiver Present: Yes (Wife, Marcia)  Vital Signs Therapy Vitals Temp: 99 F (37.2 C) (RN  notified) Temp Source:  Oral Pulse Rate: (!) 108 Resp: 18 BP: 113/73 mmHg Patient Position (if appropriate): Sitting Oxygen Therapy SpO2: 96 % O2 Device: Not Delivered  Pain Pain Assessment Pain Assessment: 0-10 Pain Score: 6  Pain Type: Surgical pain Pain Location: Leg Pain Orientation: Left;Distal Pain Descriptors / Indicators: Throbbing;Constant Pain Frequency: Intermittent Pain Onset: On-going Patients Stated Pain Goal: 2 Pain Intervention(s): Medication (See eMAR);Rest Multiple Pain Sites: Yes 2nd Pain Site Pain Score: 2 Pain Type: Acute pain Pain Location: Arm Pain Orientation: Left Pain Descriptors / Indicators: Aching;Sore Pain Onset: With Activity Pain Intervention(s): Rest  Home Living/Prior Functioning Home Living Family/patient expects to be discharged to:: Private residence Living Arrangements: Spouse/significant other Available Help at Discharge: Family, Available 24 hours/day Type of Home: House Home Access: Stairs to enter [employer] of Steps: 2 Entrance Stairs-Rails: Right Home Layout: Two level Alternate Level Stairs-Number of Steps: no plans to access upper level, all needs at main level Additional Comments: No barrier or door from master bedroom to bathroom, open architectural design (pictures available from spouse)  Lives With: Spouse IADL History Education: completed high school  Prior Function Level of Independence: Independent with basic ADLs  Able to Take Stairs?: Yes Driving: Yes Vocation: Full time employment Vocation Requirements: Office time, Psychiatric nurse, private Leisure: Hobbies-yes (Comment) Comments: Tinker around farm, tractor, [employer], cows (3), Sales promotion account executive,  ADL ADL ADL Comments: see FIM  Vision/Perception  Vision- History Baseline Vision/History: Wears glasses Wears Glasses: At all times Patient Visual Report: Peripheral vision impairment;Other (comment) Vision- Assessment Vision Assessment?: Vision  impaired- to be further tested in functional context Perception Comments: appears wfl Praxis Praxis-Other Comments: appears wfl   Cognition Overall Cognitive Status: Within Functional Limits for tasks assessed Arousal/Alertness: Awake/alert Orientation Level: Person;Place;Situation Person: Oriented Place: Oriented Situation: Oriented Year: 2016 Month: June Day of Week: Correct Memory: Appears intact Immediate Memory Recall: Sock;Blue;Bed Memory Recall: Blue;Bed Attention: Focused;Sustained Focused Attention: Appears intact Sustained Attention: Appears intact Awareness: Appears intact Problem Solving: Appears intact Safety/Judgment: Appears intact  Sensation Sensation Light Touch: Appears Intact Stereognosis: Not tested Hot/Cold: Not tested Proprioception: Appears Intact (L ankle NT) Coordination Gross Motor Movements are Fluid and Coordinated: No Fine Motor Movements are Fluid and Coordinated: Not tested Coordination and Movement Description: antalgic and discoordinated gross motor movement from L LE external fixator Finger Nose Finger Test: wfl bilaterally - slight intention tremor  Motor  Motor Motor: Within Functional Limits  Mobility  Bed Mobility Bed Mobility: Supine to Sit Supine to Sit: 6: Modified independent (Device/Increase time) Transfers Transfers: Sit to Stand;Stand to Sit Sit to Stand: 4: Min guard Stand to Sit: 4: Min guard;To toilet   Trunk/Postural Assessment  Cervical Assessment Cervical Assessment: Within Functional Limits Thoracic Assessment Thoracic Assessment: Within Functional Limits Lumbar Assessment Lumbar Assessment: Within Functional Limits Postural Control Postural Control: Within Functional Limits   Balance Balance Balance Assessed: Yes Static Sitting Balance Static Sitting - Balance Support: Right upper extremity supported;Left upper extremity supported;Feet supported Static Sitting - Level of Assistance: 5: Stand by  assistance Dynamic Sitting Balance Dynamic Sitting - Balance Support: Right upper extremity supported;Left upper extremity supported;Feet supported Dynamic Sitting - Level of Assistance: 5: Stand by assistance Static Standing Balance Static Standing - Balance Support: Bilateral upper extremity supported;During functional activity Static Standing - Level of Assistance: 4: Min assist Dynamic Standing Balance Dynamic Standing - Balance Support: Bilateral upper extremity supported;During functional activity Dynamic Standing - Level of Assistance: 4: Min assist  Extremity/Trunk Assessment RUE Assessment RUE Assessment: Within Functional Limits  LUE Assessment LUE Assessment: Exceptions to WFL LUE AROM (degrees) Overall AROM Left Upper Extremity: Deficits;Due to precautions LUE Overall AROM Comments: Limited AROM at shoulder due to precautions s/p dislocation; elbow & grip wfl LUE Strength LUE Overall Strength: Deficits;Due to precautions LUE Overall Strength Comments: shoulder flexion/abduction grossly 2+/5, elbow grossly 4/5 (make test), grip 5/5  FIM:  FIM - Eating Eating Activity: 7: Complete independence:no helper FIM - Grooming Grooming Steps: Wash, rinse, dry hands;Wash, rinse, dry face;Brush, comb hair Grooming: 5: Supervision: safety issues or verbal cues FIM - Bathing Bathing Steps Patient Completed: Chest;Right Arm;Left Arm;Abdomen;Front perineal area;Buttocks;Right upper leg;Left upper leg;Right lower leg (including foot) Bathing: 4: Min-Patient completes 8-9 [id number] parts or 75+ percent FIM - Upper Body Dressing/Undressing Upper body dressing/undressing steps patient completed: Pull shirt around back of front closure shirt/dress;Button/unbutton shirt;Thread/unthread right sleeve of front closure shirt/dress Upper body dressing/undressing: 4: Min-Patient completed 75 plus % of tasks FIM - Lower Body Dressing/Undressing Lower body dressing/undressing steps patient completed:  Thread/unthread right underwear leg Lower body dressing/undressing: 2: Max-Patient completed 25-49% of tasks FIM - Toileting Toileting: 0: Activity did not occur FIM - BControl and instrumentation engineerDevices: Walker (L PFRW) Bed/Chair Transfer: 7: Sit > Supine: No assist;7: Supine > Sit: No assist;4: Bed > Chair or W/C: Min A (steadying Pt. > 75%);4: Chair or W/C > Bed: Min A (steadying Pt. > 75%) FIM - TRadio producerDevices: Grab bars;Walker Toilet Transfers: 4-To toilet/BSC: Min A (steadying Pt. > 75%);4-From toilet/BSC: Min A (steadying Pt. > 75%) FIM - TSystems developerDevices: Shower chair;Grab bars;Walk in shower;Walker Tub/shower Transfers: 4-Into Tub/Shower: Min A (steadying Pt. > 75%/lift 1 leg);4-Out of Tub/Shower: Min A (steadying Pt. > 75%/lift 1 leg)   Refer to Care Plan for Long Term Goals  Recommendations for other services: None  Discharge Criteria: Patient will be discharged from OT if patient refuses treatment 3 consecutive times without medical reason, if treatment goals not met, if there is a change in medical status, if patient makes no progress towards goals or if patient is discharged from hospital.  The above assessment, treatment plan, treatment alternatives and goals were discussed and mutually agreed upon: by patient   Second session: Time: 1145-1200 Time Calculation (min):  15 min  Pain Assessment:7/10  Skilled Therapeutic Interventions: AE training (LH sponge and reacher), and f/u assessment of cognition (BIMS).   BIMS score qualified by extended delay as therapist attempted to locate elevating leg rest out of pt's room.    Pt reported need for reacher and plans to incorporate both items during next BADL session.   See FIM for current functional status  Therapy/Group: Individual Therapy  BMarquette6/17/2016, 3:58 PM

## 2015-04-01 NOTE — Progress Notes (Signed)
Rehab admissions - We have received a response from [facility] and have received insurance approval for CIR today from Elspeth Cho, RN (phone: [phone], ext. [phone] and fax: [phone]). Authorization #[id number] for inpatient rehab from 03-31-15 to 04-14-15 with updates due to Amy on 04-12-15 if requesting LOS extension.   I have updated pt and his wife in person. They were pleased with this news. Jacqlyn Larsen, Education officer, museum is also aware. This update was also put in [facility] note.   Thanks.  Nanetta Batty, PT Rehabilitation Admissions Coordinator [phone]

## 2015-04-01 NOTE — Progress Notes (Signed)
[place] PHYSICAL MEDICINE & REHABILITATION     PROGRESS NOTE    Subjective/Complaints: Had a fair night. Leg pain kept him awake a bit. No problems with ex/fix, wounds  ROS: Pt denies fever, rash/itching, headache, blurred or double vision, nausea, vomiting, abdominal pain, diarrhea, chest pain, shortness of breath, palpitations, dysuria, dizziness, neck or back pain, bleeding, anxiety, or depression   Objective: Vital Signs: Blood pressure 137/86, pulse 97, temperature 98.5 F (36.9 C), temperature source Oral, resp. rate 18, height 5\' 9"  (1.753 m), weight 83.915 kg (185 lb), SpO2 96 %. No results found. No results for input(s): WBC, HGB, HCT, PLT in the last 72 hours. No results for input(s): NA, K, CL, GLUCOSE, BUN, CREATININE, CALCIUM in the last 72 hours.  Invalid input(s): CO CBG (last 3)  No results for input(s): GLUCAP in the last 72 hours.  Wt Readings from Last 3 Encounters:  03/31/15 83.915 kg (185 lb)  03/28/15 85 kg (187 lb 6.3 oz)  02/21/15 85.639 kg (188 lb 12.8 oz)    Physical Exam:  Constitutional: He is oriented to person, place, and time. He appears well-developed and well-nourished.  HENT:  Head: Normocephalic and atraumatic.  Eyes: Conjunctivae are normal. Pupils are equal, round, and reactive to light.  Neck: Normal range of motion. Neck supple.  Cardiovascular: Normal rate and regular rhythm.  No murmur heard. Respiratory: Effort normal and breath sounds normal. No respiratory distress. He has no wheezes.  GI: Soft. Bowel sounds are normal. He exhibits no distension. There is no tenderness.  Musculoskeletal:  External fixator on Left ankle with dry dressing.   Left shoulder pain with AROM/PROM  Neurological: He is alert and oriented to person, place, and time.  Speech clear. Able to follow commands without difficulty. Finger to nose intact RUE. Right lower [place]onopia. RUE and RLE strength grossly . Sensation appears grossly intact.  Has good insight and awareness  Skin: Skin is warm and dry. Rash (Rash on back now with scabs---welts resolved. ) noted.  Psychiatric: He has a normal mood and affect. His behavior is normal. Judgment and thought content normal  Assessment/Plan: 1. Functional deficits secondary to embolic bilateral CVA, left tib-fib fx with ex-fix, left shoulder dislocation which require 3+ hours per day of interdisciplinary therapy in a comprehensive inpatient rehab setting. Physiatrist is providing close team supervision and 24 hour management of active medical problems listed below. Physiatrist and rehab team continue to assess barriers to discharge/monitor patient progress toward functional and medical goals. FIM:                   Comprehension Comprehension Mode: Auditory Comprehension: 7-Follows complex conversation/direction: With no assist  Expression Expression Mode: Verbal Expression: 7-Expresses complex ideas: With no assist  Social Interaction Social Interaction: 7-Interacts appropriately with others - No medications needed.  Problem Solving Problem Solving: 7-Solves complex problems: Recognizes & self-corrects  Memory Memory: 7-Complete Independence: No helper    Medical Problem List and Plan: 1. Functional deficits secondary to embolic bilateral CVA's, left tib-fib fx, and left shoulder dislocation 2. DVT Prophylaxis/Anticoagulation: Pharmaceutical: Lovenox 3. Pain Management: Continue oxycodone prn with robaxin as needed for muscle spasms.  4. Mood: LCSW to follow for evaluation and support.  5. Neuropsych: This patient is capable of making decisions on his own behalf. 6. Skin/Wound Care: Routine pin care bid. Clean currently  7. Fluids/Electrolytes/Nutrition: Monitor I/O. Labs pending. Po intake appears to be good.  8. ABLA: Monitor with routine checks.  9. Closed reduction  of Left shoulder dislocation: WBAT LUE with platform walker with gentle L shoulder  motion- pendulums, abduction and FF.Unrestricted ROM L elbow, forearm, wrist and hand . 10. Open distal Tib/Fib fracture s/p I & d with revision: NWB. LLE.   -internal fixation in 10-14 days LOS (Days) 1 A FACE TO FACE EVALUATION WAS PERFORMED  Shatina Streets T 04/01/2015 7:41 AM

## 2015-04-01 NOTE — IPOC Note (Addendum)
Overall Plan of Care [facility]) Patient Details Name: DAMONTA COSSEY MRN: [id number] DOB: Jan 28, 1951  Admitting Diagnosis: L CVA L pilon fx and L shoulder dislocation   Hospital Problems: Active Problems:   Embolic cerebral infarction     Functional Problem List: Nursing Endurance, Motor, Pain, Safety, Skin Integrity  PT Balance, Edema, Endurance, Motor, Pain, Safety, Skin Integrity  OT Pain, Endurance, Balance  SLP    TR         Basic ADL's: OT Bathing, Dressing, Toileting     Advanced  ADL's: OT       Transfers: PT Bed Mobility, Bed to Chair, Car, Manufacturing systems engineer, Metallurgist: PT Ambulation, Emergency planning/management officer, Stairs     Additional Impairments: OT Fuctional Use of Upper Extremity  SLP        TR      Anticipated Outcomes Item Anticipated Outcome  Self Feeding Independent  Swallowing      Basic self-care  Mod I  Toileting  Mod I   Bathroom Transfers Mod I  Bowel/Bladder  Continent to bowel and bladder.  Transfers  Mod I with PFRW  Locomotion  Mod I short distance ambulation with PFRW and mod I w/c  Communication     Cognition     Pain  Less than 3 on scale 1 to 10.  Safety/Judgment  Free from fall during his time in rehab.   Therapy Plan: PT Intensity: Minimum of 1-2 x/day ,45 to 90 minutes PT Frequency: 5 out of 7 days PT Duration Estimated Length of Stay: 10-14 days OT Intensity: Minimum of 1-2 x/day, 45 to 90 minutes OT Frequency: 5 out of 7 days OT Duration/Estimated Length of Stay: 10-14 days         Team Interventions: Nursing Interventions Patient/Family Education  PT interventions Ambulation/gait training, Disease management/prevention, Pain management, Stair training, Visual/perceptual remediation/compensation, Training and development officer, DME/adaptive equipment instruction, Patient/family education, Therapeutic Activities, Wheelchair propulsion/positioning, Functional electrical stimulation, Psychosocial  support, Therapeutic Exercise, Community reintegration, Functional mobility training, Skin care/wound management, UE/LE Strength taining/ROM, Discharge planning, Neuromuscular re-education, Splinting/orthotics, UE/LE Coordination activities  OT Interventions Therapeutic Exercise, UE/LE Strength taining/ROM, Therapeutic Activities, Self Care/advanced ADL retraining, Patient/family education, Functional mobility training, DME/adaptive equipment instruction, Discharge planning  SLP Interventions    TR Interventions    SW/CM Interventions Discharge Planning, Psychosocial Support, Patient/Family Education    Team Discharge Planning: Destination: PT-Home (or return to acute care for surgery) ,OT- Home , SLP-  Projected Follow-up: PT-Home health PT, Outpatient PT (pending progress), OT-  None, SLP-  Projected Equipment Needs: PT-To be determined, OT- To be determined, SLP-  Equipment Details: PT- , OT-  Patient/family involved in discharge planning: PT- Patient, Family member/caregiver,  OT-Patient, Family member/caregiver, SLP-Patient  MD ELOS: 10-14d Medical Rehab Prognosis:  Good Assessment: 64 y.o. male with history of TIA, Left parietal infarct 04/2013 s/p loop recorder implantation, PFO, Bell's palsy; who fell off a ladder on 03/22/15 with subsequent left grade 3A open left pilon fibula/ tibia fracture and left anterior shoulder dislocation. No LOC, CP or palpitations reported prior to fall. He was taken to OR emergently for left shoulder close reduction as well as I & D left distal Tib-Fib with placement of external fixator. Dr. Marcelino Scot consulted for input and patient taken to OR on 06/09 for I & D with ORIF left pilon, tibia and Fibula as well as CR of left shoulder redislocation. To be NWB LUE/LLE. Post op, patient noted to be lethargic with delayed  responses as well as visual deficits. Stat CT head done revealing acute left PCA territory infarct. MRI/MRA brain done revealing acute medial left  occipital lobe infarct with additional smaller cortical infarcts bilateral occipital lobes and scattered cortical/white matter infarcts in watershed distribution bilaterally. Now requiring 24/7 Rehab RN,MD, as well as CIR level PT, OT and SLP.  Treatment team will focus on ADLs and mobility with goals set at Mod I   See Team Conference Notes for weekly updates to the plan of care

## 2015-04-01 NOTE — Progress Notes (Signed)
Social Work Assessment and Plan Social Work Assessment and Plan  Patient Details  Name: Justin [name] MRN: [id number] Date of Birth: June 01, 1951  Today's Date: 04/01/2015  Problem List:  Patient Active Problem List   Diagnosis Date Noted  . Dislocation of left shoulder joint 03/31/2015  . Embolic cerebral infarction 03/31/2015  . Cerebral infarction due to embolism of cerebral artery   . Embolic stroke   . PFO (patent foramen ovale)   . Open displaced pilon fracture of left tibia, type III 03/23/2015  . Open fracture of left tibia and fibula 03/22/2015  . LINQ reveal 08/04/2013  . Mild hyperlipidemia 05/11/2013  . Colon cancer screening 05/11/2013  . H/O: CVA (cerebrovascular accident) 04/22/2013  . Bell's palsy 04/22/2013   Past Medical History:  Past Medical History  Diagnosis Date  . Bell's palsy 1980's; [id number]; 2000's; 12/2012; 03/2013    "multiple times" (04/21/2013)  . TIA (transient ischemic attack) 04/21/2013  . Stroke   . Hypercholesteremia   . Dislocation of left shoulder joint 03/31/2015   Past Surgical History:  Past Surgical History  Procedure Laterality Date  . Tee without cardioversion N/A 04/24/2013    Procedure: TRANSESOPHAGEAL ECHOCARDIOGRAM (TEE);  Surgeon: Thayer Headings, MD;  Location: [place];  Service: Cardiovascular;  Laterality: N/A;  . Implanted loop recorder    . Colonoscopy N/A 07/13/2013    Procedure: COLONOSCOPY;  Surgeon: Danie Binder, MD;  [facility];  Service: Endoscopy;  Laterality: N/A;  8:30 AM  . Loop recorder implant N/A 04/24/2013    Procedure: LOOP RECORDER IMPLANT;  Surgeon: Deboraha Sprang, MD;  Location: [facility] C[facility];  Service: Cardiovascular;  Laterality: N/A;  . Shoulder closed reduction Left 03/22/2015    Procedure: CLOSED REDUCTION SHOULDER;  Surgeon: Rod Can, MD;  Location: [place];  Service: Orthopedics;  Laterality: Left;  . External fixation leg Left 03/22/2015    Procedure: EXTERNAL FIXATION left ankle;   Surgeon: Rod Can, MD;  Location: [place];  Service: Orthopedics;  Laterality: Left;  . I&d extremity Left 03/22/2015    Procedure: IRRIGATION AND DEBRIDEMENT OPEN TIBIA -FIBULA  FRACTURE;  Surgeon: Rod Can, MD;  Location: [place];  Service: Orthopedics;  Laterality: Left;  . External fixation leg Left 03/24/2015    Procedure: IRRIGATION AND DEBRIDEMENT  EXTERNAL FIXATURE REVISION LEFT ANKLE ;  Surgeon: Altamese Ridgefield, MD;  Location: [place];  Service: Orthopedics;  Laterality: Left;   Social History:  reports that he has never smoked. He has never used smokeless tobacco. He reports that he drinks alcohol. He reports that he does not use illicit drugs.  Family / Support Systems Marital Status: Married Patient Roles: Spouse, Parent, Other (Comment) (employee) Spouse/Significant Other: [name]  [phone] Children: Wiliam-son [phone] Other Supports: Alisa-daughter  [phone] Anticipated Caregiver: Wife Ability/Limitations of Caregiver: Wife is retired and in good health, will assist with husband care at discharge Caregiver Availability: 24/7 Family Dynamics: Pt and wife are very close and rely upon one another, their three children are estranged from them or come around when need something.  Pt has friends and colleagues who are supportive.  Social History Preferred language: English Religion: Christian Cultural Background: No issues Education: Secretary/administrator educated Read: Yes Write: Yes Employment Status: Employed Name of Employer: Mining engineer Return to Work Plans: Plans to return when healed Freight forwarder Issues: No issues Guardian/Conservator: None-according to MD pt is capable of making his own decisions while here.  Wife is here daily and  will be involved in also   Abuse/Neglect Physical Abuse: Denies Verbal Abuse: Denies Sexual Abuse: Denies Exploitation of patient/patient's resources: Denies Self-Neglect: Denies  Emotional  Status Pt's affect, behavior adn adjustment status: Pt is motivated and ready to work in therapies.  He has always been independent and able to care for himself.  Wife reports: " I don't want him doing too much and going backwards." She has measured doorways and car's for theam already. Recent Psychosocial Issues: Other health issues-loop recorder for last couple years to monitor heart. Pyschiatric History: No history deferred depression screen due to doing well and feels he is appropriate in his reactions and coping. Will monitor while here and have neuro-psych see if needed. Substance Abuse History: No issues  Patient / Family Perceptions, Expectations & Goals Pt/Family understanding of illness & functional limitations: Pt and wife have a good understanding of his condition and deficits and WB issues. They talk with MD daily and feel they have a good understanding and know the direction going. Premorbid pt/family roles/activities: Husband, employee, father, church member, etc Anticipated changes in roles/activities/participation: resume Pt/family expectations/goals: Pt states: " I want to get moving, but know I have more surgery coming, but want to be my best."  Wife states: " I will do whatever is needed to help."  " I plan to be here daily to see what I have to do to help him."  [employer]: None Premorbid Home Care/DME Agencies: None Transportation available at discharge: Wife Resource referrals recommended: Support group (specify)  Discharge Planning Living Arrangements: Spouse/significant other Support Systems: Spouse/significant other, Friends/neighbors, Social worker community Type of Residence: Private residence Insurance underwriter Resources: Multimedia programmer (specify) Nurse, mental health) Financial Resources: Employment Museum/gallery curator Screen Referred: No Living Expenses: Lives with family Money Management: Spouse, Patient Does the patient have any problems obtaining your  medications?: No Home Management: Wife Patient/Family Preliminary Plans: Return home with wife who is able to assist him at discharge, with any of his care.  She plans to be here daily to see him in therapeis to see what she will need to do for him.  The plan according to them is to be here 10-14 days and then return to surgery to remove fixator. Social Work Anticipated Follow Up Needs: HH/OP, Support Group  Clinical Impression Pleasant gentleman who is ready to get stronger and learn to move with his fixator.  His wife is very supportive and planning to be here daily to learn his care and what she will need to do to assist him. According to them the plan is to be here to get stronger and have surgery in 10-14 days to remove fixator then home. Wife is working on Advice worker, issues with that.  Wife has already measured the doorways and Height of the bed to give to therapies. Work on discharge plans.  Elease Hashimoto 04/01/2015, 11:25 AM

## 2015-04-01 NOTE — Evaluation (Signed)
Physical Therapy Assessment and Plan  Patient Details  Name: Justin [name] MRN: [id number] Date of Birth: 10-26-1950  PT Diagnosis: Coordination disorder, Difficulty walking, Edema, Muscle weakness and Pain in L Leg and L arm Rehab Potential: Excellent ELOS: 10-14 days   Today's Date: 04/01/2015 PT Individual Time: 1300-1415 PT Individual Time Calculation (min): 75 min    Problem List:  Patient Active Problem List   Diagnosis Date Noted  . Dislocation of left shoulder joint 03/31/2015  . Embolic cerebral infarction 03/31/2015  . Cerebral infarction due to embolism of cerebral artery   . Embolic stroke   . PFO (patent foramen ovale)   . Open displaced pilon fracture of left tibia, type III 03/23/2015  . Open fracture of left tibia and fibula 03/22/2015  . LINQ reveal 08/04/2013  . Mild hyperlipidemia 05/11/2013  . Colon cancer screening 05/11/2013  . H/O: CVA (cerebrovascular accident) 04/22/2013  . Bell's palsy 04/22/2013    Past Medical History:  Past Medical History  Diagnosis Date  . Bell's palsy 1980's; [id number]; 2000's; 12/2012; 03/2013    "multiple times" (04/21/2013)  . TIA (transient ischemic attack) 04/21/2013  . Stroke   . Hypercholesteremia   . Dislocation of left shoulder joint 03/31/2015   Past Surgical History:  Past Surgical History  Procedure Laterality Date  . Tee without cardioversion N/A 04/24/2013    Procedure: TRANSESOPHAGEAL ECHOCARDIOGRAM (TEE);  Surgeon: Thayer Headings, MD;  Location: [place];  Service: Cardiovascular;  Laterality: N/A;  . Implanted loop recorder    . Colonoscopy N/A 07/13/2013    Procedure: COLONOSCOPY;  Surgeon: Danie Binder, MD;  [facility];  Service: Endoscopy;  Laterality: N/A;  8:30 AM  . Loop recorder implant N/A 04/24/2013    Procedure: LOOP RECORDER IMPLANT;  Surgeon: Deboraha Sprang, MD;  Location: [facility] CATH LAB;  Service: Cardiovascular;  Laterality: N/A;  . Shoulder closed reduction Left 03/22/2015     Procedure: CLOSED REDUCTION SHOULDER;  Surgeon: Rod Can, MD;  Location: [place];  Service: Orthopedics;  Laterality: Left;  . External fixation leg Left 03/22/2015    Procedure: EXTERNAL FIXATION left ankle;  Surgeon: Rod Can, MD;  Location: [place];  Service: Orthopedics;  Laterality: Left;  . I&d extremity Left 03/22/2015    Procedure: IRRIGATION AND DEBRIDEMENT OPEN TIBIA -FIBULA  FRACTURE;  Surgeon: Rod Can, MD;  Location: [place];  Service: Orthopedics;  Laterality: Left;  . External fixation leg Left 03/24/2015    Procedure: IRRIGATION AND DEBRIDEMENT  EXTERNAL FIXATURE REVISION LEFT ANKLE ;  Surgeon: Altamese [place], MD;  Location: [place];  Service: Orthopedics;  Laterality: Left;    Assessment & Plan Clinical Impression:  Justin [name] is a 64 y.o. male with history of TIA, Left parietal infarct 04/2013 s/p loop recorder implantation, PFO, Bell's palsy; who fell off a ladder on 03/22/15 with subsequent left grade 3A open left pilon fibula/ tibia fracture and left anterior shoulder dislocation. No LOC, CP or palpitations reported prior to fall. He was taken to OR emergently for left shoulder close reduction as well as I & D left distal Tib-Fib with placement of external fixator. Dr. Marcelino Scot consulted for input and patient taken to OR on 06/09 for I & D with ORIF left pilon, tibia and Fibula as well as CR of left shoulder redislocation. To be NWB LUE/LLE. Post op, patient noted to be lethargic with delayed responses as well as visual deficits. Stat CT head done revealing acute left PCA territory  infarct. MRI/MRA brain done revealing acute medial left occipital lobe infarct with additional smaller cortical infarcts bilateral occipital lobes and scattered cortical/white matter infarcts in watershed distribution bilaterally. No significant intracranial stenosis/ atherosclerosis noted. Carotid dopplers without significant ICA stenosis. BLE dopplers negative for DVT. Cardiac echo with EF 65% with  no wall abnormality.   Neurology consulted for input and recommended changing ASA to plavix for secondary stroke prevention of Embolic CVA of questionable etiology. Loop recorder without evidence of A Fib. Dr. Leonie Man has discussed the question of PFO closure/compassionate use of FDA study device after healing from fractures. Visual changes improving and lethargy has resolved. MRI left shoulder done for evaluation and revealed avulsion of glenoid attache ment of inferior glenohumeral ligament and multiple muscle strains around the shoulder. He has been cleared to start gentle ROM left shoulder and to start WBAT LUE today. External fixator in place on LLE with plans for definitive surgery in the next 10-14 days. Has completed IV antibiotics for open fracture. He developed a questionable drug rash that is resolving. Therapy ongoing and CIR recommended for by MD and Rehab team. Patient transferred to CIR on 03/31/2015 .   Patient currently requires min with mobility secondary to muscle weakness, decreased cardiorespiratoy endurance, decreased coordination, decreased R peripheral vision and decreased sitting balance, decreased standing balance and difficulty maintaining precautions.  Prior to hospitalization, patient was independent  with mobility and lived with Spouse in a House home.  Home access is 2Stairs to enter.  Patient will benefit from skilled PT intervention to maximize safe functional mobility, minimize fall risk and decrease caregiver burden for planned discharge home with 24 hour assist.  Anticipate patient will benefit from follow-up HHPT vs outpatient PT, pending progress at discharge.  PT - End of Session Activity Tolerance: Tolerates < 10 min activity with changes in vital signs Endurance Deficit: Yes Endurance Deficit Description: cardiorespiratory - HR and RR increase quickly with little activity PT Assessment Rehab Potential (ACUTE/IP ONLY): Excellent Barriers to Discharge:  Inaccessible home environment Barriers to Discharge Comments: 2 STE with R rail PT Patient demonstrates impairments in the following area(s): Balance;Edema;Endurance;Motor;Pain;Safety;Skin Integrity PT Transfers Functional Problem(s): Bed Mobility;Bed to Chair;Car;Furniture PT Locomotion Functional Problem(s): Ambulation;Wheelchair Mobility;Stairs PT Plan PT Intensity: Minimum of 1-2 x/day ,45 to 90 minutes PT Frequency: 5 out of 7 days PT Duration Estimated Length of Stay: 10-14 days PT Treatment/Interventions: Ambulation/gait training;Disease management/prevention;Pain management;Stair training;Visual/perceptual remediation/compensation;Balance/vestibular training;DME/adaptive equipment instruction;Patient/family education;Therapeutic Activities;Wheelchair propulsion/positioning;Functional electrical stimulation;Psychosocial support;Therapeutic Exercise;Community reintegration;Functional mobility training;Skin care/wound management;UE/LE Strength taining/ROM;Discharge planning;Neuromuscular re-education;Splinting/orthotics;UE/LE Coordination activities PT Transfers Anticipated Outcome(s): Mod I with PFRW PT Locomotion Anticipated Outcome(s): Supervision short distance ambulation with PFRW and mod I w/c PT Recommendation Follow Up Recommendations: Home health PT;Outpatient PT (pending progress) Patient destination: Home (or return to acute care for surgery) Equipment Recommended: To be determined  Skilled Therapeutic Intervention PT Evaluation: PT evaluation complete - pt presents with very low activity tolerance, impaired standing balance, difficulty with transfers, gait, w/c propulsion, and stairs. Pt will benefit from IPR PT to maximize safety with functional mobility.   Gait Training: PT instructs pt in ambulation with RW x 30' req min A and verbal cues to stay close to [place]: low endurance and heavy reliance on platform walker noted. PT instructs pt in ascending/descending one low step  with PFRW backwards x 2 reps req min A, with verbal cues to get foot all the way onto step.   W/C Management: PT instructs pt in w/c propulsion with B UEs 200'  x 2 reps req SBA on way to gym and min A on way back due to impulsiveness/high speed indoors, despite verbal cues to slow down.   Therapeutic Activity: Pt received up in w/c, agreeable to transfer into recliner for improved comfort req CGA to stand-hop transfer with L PFRW for safety. Pt transfers recliner to bed and bed to w/c and w/c to recliner with same level of assist. Pt completes supine to/from sit transfer in bed and rolling R in bed independent.   Continue per PT POC.    PT Evaluation Precautions/Restrictions Precautions Precautions: Shoulder;Fall Type of Shoulder Precautions: s/p anterior shoulder subluxation and reductions x 2, pendulums, gentle ROM L shoulder--abd and FF, may use elbow to hand freely, may use L UE for mobilization and use PFRW Shoulder Interventions: Shoulder sling/immobilizer Precaution Booklet Issued: No Precaution Comments: WBAT L upper extremity with platform - Per [name], PA Required Braces or Orthoses: Other Brace/Splint Other Brace/Splint: External fixator on LLE  Restrictions Weight Bearing Restrictions: Yes LUE Weight Bearing: Non weight bearing LLE Weight Bearing: Non weight bearing General Chart Reviewed: Yes Family/Caregiver Present: Yes (wife) Vital Signs  Pain Pain Assessment Pain Assessment: 0-10 Pain Score: 6  Pain Type: Surgical pain Pain Location: Leg Pain Orientation: Left;Distal Pain Descriptors / Indicators: Throbbing;Constant Pain Frequency: Intermittent Pain Onset: On-going Patients Stated Pain Goal: 2 Pain Intervention(s): Medication (See eMAR);Rest Multiple Pain Sites: Yes 2nd Pain Site Pain Score: 2 Pain Type: Acute pain Pain Location: Arm Pain Orientation: Left Pain Descriptors / Indicators: Aching;Sore Pain Onset: With Activity Pain Intervention(s):  Rest Home Living/Prior Functioning Home Living Living Arrangements: Spouse/significant other Available Help at Discharge: Family;Available 24 hours/day Type of Home: House Home Access: Stairs to enter [employer] of Steps: 2 Entrance Stairs-Rails: Right Home Layout: Two level Alternate Level Stairs-Number of Steps: no plans to access upper level, all needs at main level Additional Comments: No barrier or door from master bedroom to bathroom, open architectural design (pictures available from spouse)  Lives With: Spouse Prior Function Level of Independence: Independent with basic ADLs  Able to Take Stairs?: Yes Driving: Yes Vocation: Full time employment Vocation Requirements: Office time, Psychiatric nurse, private Leisure: Hobbies-yes (Comment) Comments: Tinker around farm, tractor, [employer], cows (3), Sales promotion account executive, Vision/Perception  Perception Comments: appears wfl Retail buyer Comments: appears wfl  Cognition Overall Cognitive Status: Within Functional Limits for tasks assessed Arousal/Alertness: Awake/alert Orientation Level: Oriented X4 Attention: Focused;Sustained Focused Attention: Appears intact Sustained Attention: Appears intact Memory: Appears intact Awareness: Appears intact Problem Solving: Appears intact Safety/Judgment: Appears intact Sensation Sensation Light Touch: Appears Intact Stereognosis: Not tested Hot/Cold: Not tested Proprioception: Appears Intact (L ankle NT) Coordination Gross Motor Movements are Fluid and Coordinated: No Fine Motor Movements are Fluid and Coordinated: Not tested Coordination and Movement Description: antalgic and discoordinated gross motor movement from L LE external fixator Finger Nose Finger Test: wfl bilaterally - slight intention tremor Motor  Motor Motor: Within Functional Limits Motor - Skilled Clinical Observations: antalgic movement due to L LE  Mobility Bed Mobility Bed Mobility: Rolling  Right;Sit to Supine;Supine to Sit Rolling Right: 7: Independent Supine to Sit: 7: Independent Sit to Supine: 7: Independent Transfers Transfers: Yes Sit to Stand: 4: Min guard Stand to Sit: 4: Min guard Stand Pivot Transfers: 4: Min guard Locomotion  Ambulation Ambulation: Yes Ambulation/Gait Assistance: 4: Min Wellsite geologist (Feet): 30 Feet Assistive device: Left platform walker Ambulation/Gait Assistance Details: Manual facilitation for placement;Verbal cues for technique Gait Gait: Yes Gait Pattern: Impaired  Gait Pattern:  (step-hop, heavy reliance on arms, slightly too far from walker for safety) Gait velocity: guarded and cautious Stairs / Additional Locomotion Stairs: Yes Stairs Assistance: 4: Min assist Stairs Assistance Details: Manual facilitation for placement Stair Management Technique: Backwards;With walker (L platform RW) Number of Stairs: 1 Height of Stairs: 3 Wheelchair Mobility Wheelchair Mobility: Yes Wheelchair Assistance: 4: Advertising account executive Details: Verbal cues for technique;Manual facilitation for Horticulturist, commercial: Both upper extremities Wheelchair Parts Management: Needs assistance Distance: 200  Trunk/Postural Assessment  Cervical Assessment Cervical Assessment: Within Functional Limits Thoracic Assessment Thoracic Assessment: Within Functional Limits Lumbar Assessment Lumbar Assessment: Within Functional Limits Postural Control Postural Control: Within Functional Limits  Balance Balance Balance Assessed: Yes Static Sitting Balance Static Sitting - Balance Support: Right upper extremity supported;Left upper extremity supported;Feet supported Static Sitting - Level of Assistance: 5: Stand by assistance Dynamic Sitting Balance Dynamic Sitting - Balance Support: Right upper extremity supported;Left upper extremity supported;Feet supported Dynamic Sitting - Level of Assistance: 5: Stand by  assistance Static Standing Balance Static Standing - Balance Support: Bilateral upper extremity supported;During functional activity Static Standing - Level of Assistance: 4: Min assist Dynamic Standing Balance Dynamic Standing - Balance Support: Bilateral upper extremity supported;During functional activity Dynamic Standing - Level of Assistance: 4: Min assist Extremity Assessment  RUE Assessment RUE Assessment: Within Functional Limits LUE Assessment LUE Assessment: Exceptions to WFL LUE AROM (degrees) Overall AROM Left Upper Extremity: Deficits;Due to precautions LUE Overall AROM Comments: Limited AROM at shoulder due to precautions s/p dislocation; elbow & grip wfl LUE Strength LUE Overall Strength: Deficits;Due to precautions LUE Overall Strength Comments: shoulder flexion/abduction grossly 2+/5, elbow grossly 4/5 (make test), grip 5/5 RLE Assessment RLE Assessment: Within Functional Limits LLE Assessment LLE Assessment: Exceptions to WFL LLE AROM (degrees) Overall AROM Left Lower Extremity: Deficits;Due to precautions LLE Overall AROM Comments: ankle NT due to external fixator: knee & hip wfl LLE Strength LLE Overall Strength: Deficits;Due to precautions LLE Overall Strength Comments: hip flexion/extension grossly 3+/5, hip abduction/adduction >= 2/5; ankle NT due to external fixator   FIM:  FIM - Locomotion: Wheelchair Distance: 200 FIM - Locomotion: Ambulation Ambulation/Gait Assistance: 4: Min assist   Refer to Care Plan for Long Term Goals  Recommendations for other services: None  Discharge Criteria: Patient will be discharged from PT if patient refuses treatment 3 consecutive times without medical reason, if treatment goals not met, if there is a change in medical status, if patient makes no progress towards goals or if patient is discharged from hospital.  The above assessment, treatment plan, treatment alternatives and goals were discussed and mutually agreed  upon: by patient and by family  [facility] M 04/01/2015, 2:12 PM

## 2015-04-01 NOTE — Evaluation (Signed)
Speech Language Pathology Assessment and Plan  Patient Details  Name: Justin [name] MRN: [id number] Date of Birth: November 21, 1950   Today's Date: 04/01/2015 SLP Individual Time: 9562-1308 SLP Individual Time Calculation (min): 45 min   Problem List:  Patient Active Problem List   Diagnosis Date Noted  . Dislocation of left shoulder joint 03/31/2015  . Embolic cerebral infarction 03/31/2015  . Cerebral infarction due to embolism of cerebral artery   . Embolic stroke   . PFO (patent foramen ovale)   . Open displaced pilon fracture of left tibia, type III 03/23/2015  . Open fracture of left tibia and fibula 03/22/2015  . LINQ reveal 08/04/2013  . Mild hyperlipidemia 05/11/2013  . Colon cancer screening 05/11/2013  . H/O: CVA (cerebrovascular accident) 04/22/2013  . Bell's palsy 04/22/2013   Past Medical History:  Past Medical History  Diagnosis Date  . Bell's palsy 1980's; [id number]; 2000's; 12/2012; 03/2013    "multiple times" (04/21/2013)  . TIA (transient ischemic attack) 04/21/2013  . Stroke   . Hypercholesteremia   . Dislocation of left shoulder joint 03/31/2015   Past Surgical History:  Past Surgical History  Procedure Laterality Date  . Tee without cardioversion N/A 04/24/2013    Procedure: TRANSESOPHAGEAL ECHOCARDIOGRAM (TEE);  Surgeon: Thayer Headings, MD;  Location: [place];  Service: Cardiovascular;  Laterality: N/A;  . Implanted loop recorder    . Colonoscopy N/A 07/13/2013    Procedure: COLONOSCOPY;  Surgeon: Danie Binder, MD;  [facility];  Service: Endoscopy;  Laterality: N/A;  8:30 AM  . Loop recorder implant N/A 04/24/2013    Procedure: LOOP RECORDER IMPLANT;  Surgeon: Deboraha Sprang, MD;  [facility];  Service: Cardiovascular;  Laterality: N/A;  . Shoulder closed reduction Left 03/22/2015    Procedure: CLOSED REDUCTION SHOULDER;  Surgeon: Rod Can, MD;  Location: [place];  Service: Orthopedics;  Laterality: Left;  . External fixation leg  Left 03/22/2015    Procedure: EXTERNAL FIXATION left ankle;  Surgeon: Rod Can, MD;  Location: [place];  Service: Orthopedics;  Laterality: Left;  . I&d extremity Left 03/22/2015    Procedure: IRRIGATION AND DEBRIDEMENT OPEN TIBIA -FIBULA  FRACTURE;  Surgeon: Rod Can, MD;  Location: [place];  Service: Orthopedics;  Laterality: Left;  . External fixation leg Left 03/24/2015    Procedure: IRRIGATION AND DEBRIDEMENT  EXTERNAL FIXATURE REVISION LEFT ANKLE ;  Surgeon: Altamese [place], MD;  Location: [place];  Service: Orthopedics;  Laterality: Left;    Assessment / Plan / Recommendation Clinical Impression  Justin [name] is a 64 y.o. male with history of TIA, Left parietal infarct 04/2013 s/p loop recorder implantation, PFO, Bell's palsy; who fell off a ladder on 03/22/15 with subsequent left grade 3A open left pilon fibula/ tibia fracture and left anterior shoulder dislocation. No LOC, CP or palpitations reported prior to fall. Post op, patient noted to be lethargic with delayed responses as well as visual deficits. Stat CT head done revealing acute left PCA territory infarct. MRI/MRA brain done revealing acute medial left occipital lobe infarct with additional smaller cortical infarcts bilateral occipital lobes and scattered cortical/white matter infarcts in watershed distribution bilaterally. Visual changes improving and lethargy has resolved.  Pt admitted to [facility] 03/31/2015. SLP evaluation completed on 04/01/2015 with the following results: Pt presents with grossly intact cognitive-linguistic skills for all domains assessed including recall, executive function, awareness, and attention.  Pt reports return to cognitive baseline.  His speech is fluent and appropriate with  no evidence of word finding deficits or dysarthria.  He also reports good toleration of regular textures and thin liquids.  As a results, no further ST needs are indicated at this point.    Skilled Therapeutic Interventions           Cognitive-linguistic evaluation completed with results and recommendations reviewed with family.     SLP Assessment  Patient does not need any further Speech Lanaguage Pathology Services              Pain Pain Assessment Pain Assessment: No/denies pain  Prior Functioning Cognitive/Linguistic Baseline: Within functional limits Type of Home: House  Lives With: Spouse Available Help at Discharge: Family;Available 24 hours/day Education: completed high school  Vocation: Full time employment   Discharge Criteria: Patient will be discharged from SLP if patient refuses treatment 3 consecutive times without medical reason, if treatment goals not met, if there is a change in medical status, if patient makes no progress towards goals or if patient is discharged from hospital.  The above assessment, treatment plan, treatment alternatives and goals were discussed and mutually agreed upon: by patient  Emilio Math 04/01/2015, 12:29 PM

## 2015-04-01 NOTE — Care Management Note (Signed)
[place] Individual Statement of Services  Patient Name:  Justin [name]  Date:  04/01/2015  Welcome to the [facility].  Our goal is to provide you with an individualized program based on your diagnosis and situation, designed to meet your specific needs.  With this comprehensive rehabilitation program, you will be expected to participate in at least 3 hours of rehabilitation therapies Monday-Friday, with modified therapy programming on the weekends.  Your rehabilitation program will include the following services:  Physical Therapy (PT), Occupational Therapy (OT), Speech Therapy (ST), 24 hour per day rehabilitation nursing, Therapeutic Recreaction (TR), Case Management (Social Worker), Rehabilitation Medicine, Nutrition Services and Pharmacy Services  Weekly team conferences will be held on Tuesday to discuss your progress.  Your Social Worker will talk with you frequently to get your input and to update you on team discussions.  Team conferences with you and your family in attendance may also be held.  Expected length of stay: 10-14 days  Overall anticipated outcome: supervision/mod/i level  Depending on your progress and recovery, your program may change. Your Social Worker will coordinate services and will keep you informed of any changes. Your Social Worker's name and contact numbers are listed  below.  The following services may also be recommended but are not provided by the [facility] will be made to provide these services after discharge if needed.  Arrangements include referral to agencies that provide these services.  Your insurance has been verified to be:  [place] working on getting reinstated Your primary doctor is:  [employer]  Pertinent information will be shared with  your doctor and your insurance company.  Social Worker:  Lennart Pall, [place] [phone] or ([phone]   Information discussed with and copy given to patient by: Elease Hashimoto, 04/01/2015, 10:05 AM

## 2015-04-02 ENCOUNTER — Inpatient Hospital Stay (HOSPITAL_COMMUNITY): Payer: BLUE CROSS/BLUE SHIELD | Admitting: Physical Therapy

## 2015-04-02 DIAGNOSIS — M25562 Pain in left knee: Secondary | ICD-10-CM

## 2015-04-02 NOTE — Progress Notes (Signed)
Justin [name] is a 64 y.o. male Jan 19, 1951 [id number]  Subjective: No new complaints. No new problems. Slept well. Feeling OK.  Objective: Vital signs in last 24 hours: Temp:  [98.5 F (36.9 C)-99 F (37.2 C)] 98.5 F (36.9 C) (06/18 0545) Pulse Rate:  [87-108] 87 (06/18 0545) Resp:  [18] 18 (06/18 0545) BP: (113-129)/(73-79) 129/79 mmHg (06/18 0545) SpO2:  [95 %-96 %] 95 % (06/18 0545) Weight change:  Last BM Date: 03/31/15  Intake/Output from previous day: 06/17 0701 - 06/18 0700 In: 720 [P.O.:720] Out: 770 [Urine:770] Last cbgs: CBG (last 3)  No results for input(s): GLUCAP in the last 72 hours.   Physical Exam General: No apparent distress   HEENT: not dry Lungs: Normal effort. Lungs clear to auscultation, no crackles or wheezes. Cardiovascular: Regular rate and rhythm, no edema Abdomen: S/NT/ND; BS(+) Musculoskeletal:  unchanged Neurological: No new neurological deficits Wounds: N/A    Skin: clear  Aging changes Mental state: Alert, oriented, cooperative    Lab Results: BMET    Component Value Date/Time   NA 137 03/29/2015 0505   K 3.8 03/29/2015 0505   CL 106 03/29/2015 0505   CO2 22 03/29/2015 0505   GLUCOSE 113* 03/29/2015 0505   BUN 15 03/29/2015 0505   CREATININE 1.01 03/29/2015 0505   CREATININE 0.96 11/22/2014 0800   CALCIUM 9.0 03/29/2015 0505   GFRNONAA >60 03/29/2015 0505   GFRNONAA 85 11/22/2014 0800   GFRAA >60 03/29/2015 0505   GFRAA >89 11/22/2014 0800   CBC    Component Value Date/Time   WBC 8.0 03/29/2015 0505   RBC 3.92* 03/29/2015 0505   HGB 11.1* 03/29/2015 0505   HCT 33.5* 03/29/2015 0505   PLT 292 03/29/2015 0505   MCV 85.5 03/29/2015 0505   MCH 28.3 03/29/2015 0505   MCHC 33.1 03/29/2015 0505   RDW 14.5 03/29/2015 0505   LYMPHSABS 2.0 11/22/2014 0800   MONOABS 0.3 11/22/2014 0800   EOSABS 0.1 11/22/2014 0800   BASOSABS 0.0 11/22/2014 0800    Studies/Results: No results found.  Medications: I have reviewed  the patient's current medications.  Assessment/Plan:   1. Functional deficits secondary to embolic bilateral CVA's, left tib-fib fx, and left shoulder dislocation 2. DVT Prophylaxis/Anticoagulation: Pharmaceutical: Lovenox 3. Pain Management: Continue oxycodone prn with robaxin as needed for muscle spasms.  4. Mood: LCSW to follow for evaluation and support.  5. Neuropsych: This patient is capable of making decisions on his own behalf. 6. Skin/Wound Care: Routine pin care bid. Clean currently  7. Fluids/Electrolytes/Nutrition: Monitor I/O. Labs pending. Po intake appears to be good.  8. ABLA: Monitor with routine checks.  9. Closed reduction of Left shoulder dislocation: WBAT LUE with platform walker with gentle L shoulder motion- pendulums, abduction and FF.Unrestricted ROM L elbow, forearm, wrist and hand . 10. Open distal Tib/Fib fracture s/p I & d with revision: NWB. LLE.  -internal fixation in 10-14 days   Length of stay, days: 2  Walker Kehr , MD 04/02/2015, 9:08 AM

## 2015-04-02 NOTE — Progress Notes (Signed)
Physical Therapy Session Note  Patient Details  Name: Justin [name] MRN: [id number] Date of Birth: December 08, 1950  Today's Date: 04/02/2015 PT Individual Time: 1500-1530 PT Individual Time Calculation (min): 30 min   Short Term Goals: Week 1:  PT Short Term Goal 1 (Week 1): Pt will demonstrate w/c to/from bed transfer with L PFRW req supervision.  PT Short Term Goal 2 (Week 1): Pt will complete 2 steps with PFRW and 1 person assist PT Short Term Goal 3 (Week 1): Pt will ambulate 29' with PFRW and min A.  PT Short Term Goal 4 (Week 1): Pt will self propel manual w/c at appropriate speeds indoors req SBA for safety (200') PT Short Term Goal 5 (Week 1): Pt will tolerate 10 consecutive minutes on Nu-step without rest break for improved cardiorespiratory endurance.   Skilled Therapeutic Interventions/Progress Updates:  Pt was seen bedside in the pm. Pt performed multiple sit to stand transfers with min guard and verbal cues with PFRW. Pt ambulated 50 feet x 2 with PFRW and S to min guard with verbal cues. Pt performed L hip flex standing, 3 sets x 10 reps each. Pt left sitting up in chair with call bell within reach.   Therapy Documentation Precautions:  Precautions Precautions: Shoulder, Fall Type of Shoulder Precautions: s/p anterior shoulder subluxation and reductions x 2, pendulums, gentle ROM L shoulder--abd and FF, may use elbow to hand freely, may use L UE for mobilization and use PFRW Shoulder Interventions: Shoulder sling/immobilizer Precaution Booklet Issued: No Precaution Comments: WBAT L upper extremity with platform - Per [name], PA Required Braces or Orthoses: Other Brace/Splint Other Brace/Splint: External fixator on LLE  Restrictions Weight Bearing Restrictions: Yes LUE Weight Bearing: Weight bearing as tolerated LLE Weight Bearing: Non weight bearing General:   Pain: Pt c/o 5/10 L ankle.    Locomotion : Ambulation Ambulation/Gait Assistance: 4: Min guard;5:  Supervision   See FIM for current functional status  Therapy/Group: Individual Therapy  [name] 04/02/2015, 3:46 PM

## 2015-04-03 ENCOUNTER — Inpatient Hospital Stay (HOSPITAL_COMMUNITY): Payer: BLUE CROSS/BLUE SHIELD | Admitting: Physical Therapy

## 2015-04-03 ENCOUNTER — Inpatient Hospital Stay (HOSPITAL_COMMUNITY): Payer: BLUE CROSS/BLUE SHIELD | Admitting: Occupational Therapy

## 2015-04-03 DIAGNOSIS — S82202H Unspecified fracture of shaft of left tibia, subsequent encounter for open fracture type I or II with delayed healing: Secondary | ICD-10-CM

## 2015-04-03 NOTE — Progress Notes (Signed)
Occupational Therapy Session Note  Patient Details  Name: Justin [name] MRN: [id number] Date of Birth: 12-28-1950  Today's Date: 04/03/2015 OT Individual Time:  - 1100-1210  (70 min)  1st session                                                     1230-1300  (30 min)  2nd session  (Missed 20 minutes due to fatigue)  Short Term Goals: Week 1:  OT Short Term Goal 1 (Week 1): STG=LTG due to anticipated short LOS      Skilled Therapeutic Interventions/Progress Updates:  1st session:  Addressed OT skilled intervention for functional mobility, transfers to toilet and shower, sit to stand, safety with RW.  Pt. Ambulated to shower with one posterior LOB with shower chair to aid him from falling.  Pt  walker hung on tiled floor to cause LOB.  Wife present.   Educated pt and wife on importance of being next to pt even when about to sit down.  Pt bathed self using LH sponge and SBA for safety.  OT provided set up for ambulation out to recliner to dress.  Pt.'s  completed dressing with min assist.    Pt. Needed assist getting pants over Left leg brace.  Pt. Performed grooming at sink in standing.  Left in recliner with lunch tray and wife in room.    2nd session:  Accommodated wife's request to see pt early so she would not have to come back later at [place] w[place] to apartment.  Marked doorway width for 21 inches to enter pt's toilet closet at home.  Pt performed side stepping strategy with min assist for balance.     Suggestion was made to remove door to toilet closet to widen door entrance.    Did shower stall transfer with back in approach and step over ledge.   Width of shower door is 22 inches.  Wife observed session.    Wife will bring measurements for built in shower seat height, and distance from shower door to shower seat for future practice and problem solving for safest transfer.  Pt fatigued at end and missed 20 minutes of session.  Transferred to recliner and left with all needs in  reach.     Therapy Documentation Precautions:  Precautions Precautions: Shoulder, Fall Type of Shoulder Precautions: s/p anterior shoulder subluxation and reductions x 2, pendulums, gentle ROM L shoulder--abd and FF, may use elbow to hand freely, may use L UE for mobilization and use PFRW Shoulder Interventions: Shoulder sling/immobilizer Precaution Booklet Issued: No Precaution Comments: WBAT L upper extremity with platform - Per [name], PA Required Braces or Orthoses: Other Brace/Splint Other Brace/Splint: External fixator on LLE  Restrictions Weight Bearing Restrictions: Yes LUE Weight Bearing: Weight bearing as tolerated LLE Weight Bearing: Non weight bearing   Pain: Pain Assessment Pain Score: 5/10  LLE  (1st and 2nd session) ADL: ADL ADL Comments: see FIM        See FIM for current functional status  Therapy/Group: Individual Therapy  Lisa Roca 04/03/2015, 7:50 AM

## 2015-04-03 NOTE — Progress Notes (Signed)
Physical Therapy Session Note  Patient Details  Name: Justin [name] MRN: [id number] Date of Birth: 1950-12-31  Today's Date: 04/03/2015 PT Individual Time: 0800-0915 PT Individual Time Calculation (min): 75 min   Short Term Goals: Week 1:  PT Short Term Goal 1 (Week 1): Pt will demonstrate w/c to/from bed transfer with L PFRW req supervision.  PT Short Term Goal 2 (Week 1): Pt will complete 2 steps with PFRW and 1 person assist PT Short Term Goal 3 (Week 1): Pt will ambulate 51' with PFRW and min A.  PT Short Term Goal 4 (Week 1): Pt will self propel manual w/c at appropriate speeds indoors req SBA for safety (200') PT Short Term Goal 5 (Week 1): Pt will tolerate 10 consecutive minutes on Nu-step without rest break for improved cardiorespiratory endurance.   Skilled Therapeutic Interventions/Progress Updates:  Pt was seen bedside in the am. Pt transferred recliner to w/c with PFRW and S. Pt propelled w/c 150 + feet with R UE and LE, S and verbal cues. Pt performed hip flex and LAQs in w/c, 3 sets x 10 reps each, 5 lbs on R LE and no weight on L LE. Pt transferred w/c to edge of mat with PFRW and S. Pt transferred edge of mat to supine with S. While supine on mat, pt performed heel slides, hip abd/add, SAQs, 3 sets x 10 reps each with 5 lbs on R LE and no weight on L LE. Pt performed 3 sets x 10 reps each SLRs with no weight B LEs. Pt transferred supine to edge of mat with S. Pt transferred edge of mat to w/c with PFRW and S. Pt ascended/descended 1 4" step with PFRW and min A with vc, going backwards up the step. Pt performed transfers w/c to mat at 30" height with PFRW and S. Pt propelled w/c about 100 feet with S and R UE/LE. Pt performed car transfers with PFRW and S with verbal cues. Pt ambulated x 2 for 50 feet each time with min guard to S and PFRW. Pt returned to room. Pt transferred w/c to recliner with [place] and S. Pt left sitting up in recliner with call bell within reach.   Therapy  Documentation Precautions:  Precautions Precautions: Shoulder, Fall Type of Shoulder Precautions: s/p anterior shoulder subluxation and reductions x 2, pendulums, gentle ROM L shoulder--abd and FF, may use elbow to hand freely, may use L UE for mobilization and use PFRW Shoulder Interventions: Shoulder sling/immobilizer Precaution Booklet Issued: No Precaution Comments: WBAT L upper extremity with platform - Per [name], PA Required Braces or Orthoses: Other Brace/Splint Other Brace/Splint: External fixator on LLE  Restrictions Weight Bearing Restrictions: Yes LUE Weight Bearing: Weight bearing as tolerated LLE Weight Bearing: Non weight bearing General:   Pain: Pt c/o 5/10 pain L ankle.    Locomotion : Ambulation Ambulation/Gait Assistance: 5: Supervision;4: Min guard   See FIM for current functional status  Therapy/Group: Individual Therapy  [name] 04/03/2015, 12:21 PM

## 2015-04-03 NOTE — Progress Notes (Addendum)
BHAVIN MONJARAZ is a 64 y.o. male Jan 23, 1951 [id number]  Subjective: No new complaints. No new problems. Slept well. Feeling better.  Objective: Vital signs in last 24 hours: Temp:  [98.5 F (36.9 C)-99 F (37.2 C)] 98.5 F (36.9 C) (06/19 0500) Pulse Rate:  [91-105] 91 (06/19 0500) Resp:  [18-20] 18 (06/19 0500) BP: (126-130)/(77-85) 130/85 mmHg (06/19 0500) SpO2:  [96 %-98 %] 96 % (06/19 0500) Weight change:  Last BM Date: 04/02/15  Intake/Output from previous day: 06/18 0701 - 06/19 0700 In: 840 [P.O.:840] Out: 900 [Urine:900] Last cbgs: CBG (last 3)  No results for input(s): GLUCAP in the last 72 hours.   Physical Exam General: No apparent distress.  In PT HEENT: not dry Lungs: Normal effort. Lungs clear to auscultation, no crackles or wheezes. Cardiovascular: Regular rate and rhythm, no edema Abdomen: S/NT/ND; BS(+) Musculoskeletal:  unchanged Neurological: No new neurological deficits Wounds: dressed Skin: clear  Aging changes Mental state: Alert, oriented, cooperative    Lab Results: BMET    Component Value Date/Time   NA 137 03/29/2015 0505   K 3.8 03/29/2015 0505   CL 106 03/29/2015 0505   CO2 22 03/29/2015 0505   GLUCOSE 113* 03/29/2015 0505   BUN 15 03/29/2015 0505   CREATININE 1.01 03/29/2015 0505   CREATININE 0.96 11/22/2014 0800   CALCIUM 9.0 03/29/2015 0505   GFRNONAA >60 03/29/2015 0505   GFRNONAA 85 11/22/2014 0800   GFRAA >60 03/29/2015 0505   GFRAA >89 11/22/2014 0800   CBC    Component Value Date/Time   WBC 8.0 03/29/2015 0505   RBC 3.92* 03/29/2015 0505   HGB 11.1* 03/29/2015 0505   HCT 33.5* 03/29/2015 0505   PLT 292 03/29/2015 0505   MCV 85.5 03/29/2015 0505   MCH 28.3 03/29/2015 0505   MCHC 33.1 03/29/2015 0505   RDW 14.5 03/29/2015 0505   LYMPHSABS 2.0 11/22/2014 0800   MONOABS 0.3 11/22/2014 0800   EOSABS 0.1 11/22/2014 0800   BASOSABS 0.0 11/22/2014 0800    Studies/Results: No results found.  Medications: I  have reviewed the patient's current medications.  Assessment/Plan:   1. Functional deficits secondary to embolic bilateral CVA's, left tib-fib fx, and left shoulder dislocation 2. DVT Prophylaxis/Anticoagulation: Pharmaceutical: Lovenox 3. Pain Management: Continue oxycodone prn with robaxin as needed for muscle spasms.  4. Mood: LCSW to follow for evaluation and support.  5. Neuropsych: This patient is capable of making decisions on his own behalf. 6. Skin/Wound Care: Routine pin care bid. Clean currently  7. Fluids/Electrolytes/Nutrition: Monitor I/O. Labs pending. Po intake appears to be good.  8. ABLA: Monitor with routine checks.  9. Closed reduction of Left shoulder dislocation: WBAT LUE with platform walker with gentle L shoulder motion- pendulums, abduction and FF.Unrestricted ROM L elbow, forearm, wrist and hand . 10. Open distal Tib/Fib fracture s/p I & d with revision: NWB. LLE.  -internal fixation in 10-14 days  Cont current Rx  Length of stay, days: 3  Walker Kehr , MD 04/03/2015, 9:21 AM

## 2015-04-04 ENCOUNTER — Inpatient Hospital Stay (HOSPITAL_COMMUNITY): Payer: BLUE CROSS/BLUE SHIELD

## 2015-04-04 ENCOUNTER — Inpatient Hospital Stay (HOSPITAL_COMMUNITY): Payer: BLUE CROSS/BLUE SHIELD | Admitting: Occupational Therapy

## 2015-04-04 DIAGNOSIS — S82202S Unspecified fracture of shaft of left tibia, sequela: Secondary | ICD-10-CM

## 2015-04-04 DIAGNOSIS — S82402S Unspecified fracture of shaft of left fibula, sequela: Secondary | ICD-10-CM

## 2015-04-04 MED ORDER — VITAMIN D3 25 MCG (1000 UNIT) PO TABS
1000.0000 [IU] | ORAL_TABLET | Freq: Two times a day (BID) | ORAL | Status: DC
  Administered 2015-04-04 – 2015-04-07 (×7): 1000 [IU] via ORAL
  Filled 2015-04-04 (×9): qty 1

## 2015-04-04 MED ORDER — VITAMIN D (ERGOCALCIFEROL) 1.25 MG (50000 UNIT) PO CAPS
50000.0000 [IU] | ORAL_CAPSULE | ORAL | Status: DC
  Administered 2015-04-04: 50000 [IU] via ORAL
  Filled 2015-04-04: qty 1

## 2015-04-04 NOTE — Progress Notes (Signed)
[place] PHYSICAL MEDICINE & REHABILITATION     PROGRESS NOTE    Subjective/Complaints: Left shoulder sore from therapy. Yesterday. Was able to work through.   ROS: Pt denies fever, rash/itching, headache, blurred or double vision, nausea, vomiting, abdominal pain, diarrhea, chest pain, shortness of breath, palpitations, dysuria, dizziness, neck or back pain, bleeding, anxiety, or depression   Objective: Vital Signs: Blood pressure 137/85, pulse 80, temperature 98.6 F (37 C), temperature source Oral, resp. rate 18, height 5\' 9"  (1.753 m), weight 83.915 kg (185 lb), SpO2 96 %. No results found. No results for input(s): WBC, HGB, HCT, PLT in the last 72 hours. No results for input(s): NA, K, CL, GLUCOSE, BUN, CREATININE, CALCIUM in the last 72 hours.  Invalid input(s): CO CBG (last 3)  No results for input(s): GLUCAP in the last 72 hours.  Wt Readings from Last 3 Encounters:  03/31/15 83.915 kg (185 lb)  03/28/15 85 kg (187 lb 6.3 oz)  02/21/15 85.639 kg (188 lb 12.8 oz)    Physical Exam:  Constitutional: He is oriented to person, place, and time. He appears well-developed and well-nourished.  HENT:  Head: Normocephalic and atraumatic.  Eyes: Conjunctivae are normal. Pupils are equal, round, and reactive to light.  Neck: Normal range of motion. Neck supple.  Cardiovascular: Normal rate and regular rhythm.  No murmur heard. Respiratory: Effort normal and breath sounds normal. No respiratory distress. He has no wheezes.  GI: Soft. Bowel sounds are normal. He exhibits no distension. There is no tenderness.  Musculoskeletal:  External fixator on Left ankle with dry dressing.   Left shoulder pain with AROM/PROM--can't lift above 60 degrees abduction  Neurological: He is alert and oriented to person, place, and time.  Speech clear. Able to follow commands without difficulty. Finger to nose intact RUE. Right lower quadranopia . RUE and RLE strength grossly . Sensation  appears grossly intact. Has good insight and awareness  Skin: Skin is warm and dry. Rash improved. Pin sites clean Psychiatric: He has a normal mood and affect. His behavior is normal. Judgment and thought content normal  Assessment/Plan: 1. Functional deficits secondary to embolic bilateral CVA, left tib-fib fx with ex-fix, left shoulder dislocation which require 3+ hours per day of interdisciplinary therapy in a comprehensive inpatient rehab setting. Physiatrist is providing close team supervision and 24 hour management of active medical problems listed below. Physiatrist and rehab team continue to assess barriers to discharge/monitor patient progress toward functional and medical goals. FIM: FIM - Bathing Bathing Steps Patient Completed: Chest, Right Arm, Left Arm, Abdomen, Front perineal area, Buttocks, Right upper leg, Left upper leg, Right lower leg (including foot) Bathing: 4: Min-Patient completes 8-9 67f 10 parts or 75+ percent  FIM - Upper Body Dressing/Undressing Upper body dressing/undressing steps patient completed: Pull shirt around back of front closure shirt/dress, Button/unbutton shirt, Thread/unthread right sleeve of front closure shirt/dress Upper body dressing/undressing: 4: Min-Patient completed 75 plus % of tasks FIM - Lower Body Dressing/Undressing Lower body dressing/undressing steps patient completed: Thread/unthread right underwear leg Lower body dressing/undressing: 2: Max-Patient completed 25-49% of tasks  FIM - Toileting Toileting steps completed by patient: Adjust clothing prior to toileting, Performs perineal hygiene, Adjust clothing after toileting Toileting Assistive Devices: Grab bar or rail for support Toileting: 5: Supervision: Safety issues/verbal cues  FIM - Radio producer Devices: Grab bars, Insurance account manager Transfers: 4-To toilet/BSC: Min A (steadying Pt. > 75%), 4-From toilet/BSC: Min A (steadying Pt. > 75%)  FIM -  Bed/Chair  Financial planner Devices: Copy: 5: Supine > Sit: Supervision (verbal cues/safety issues), 5: Sit > Supine: Supervision (verbal cues/safety issues), 5: Bed > Chair or W/C: Supervision (verbal cues/safety issues), 5: Chair or W/C > Bed: Supervision (verbal cues/safety issues)  FIM - Locomotion: Wheelchair Distance: 200 Locomotion: Wheelchair: 5: Travels 150 ft or more: maneuvers on rugs and over door sills with supervision, cueing or coaxing FIM - Locomotion: Ambulation Locomotion: Ambulation Assistive Devices: Engineer, agricultural Ambulation/Gait Assistance: 5: Supervision, 4: Min guard Locomotion: Ambulation: 2: Travels 50 - 149 ft with minimal assistance (Pt.>75%)  Comprehension Comprehension Mode: Auditory Comprehension: 7-Follows complex conversation/direction: With no assist  Expression Expression Mode: Verbal Expression: 7-Expresses complex ideas: With no assist  Social Interaction Social Interaction: 7-Interacts appropriately with others - No medications needed.  Problem Solving Problem Solving: 7-Solves complex problems: Recognizes & self-corrects  Memory Memory: 7-Complete Independence: No helper    Medical Problem List and Plan: 1. Functional deficits secondary to embolic bilateral CVA's, left tib-fib fx, and left shoulder dislocation 2. DVT Prophylaxis/Anticoagulation: Pharmaceutical: Lovenox 3. Pain Management: Continue oxycodone prn with robaxin as needed for muscle spasms.  4. Mood: LCSW to follow for evaluation and support.  5. Neuropsych: This patient is capable of making decisions on his own behalf. 6. Skin/Wound Care: continue Routine pin care bid. Sites clean 7. Fluids/Electrolytes/Nutrition: Monitor I/O. Labs reviewed Po intake appears to be good.  8. ABLA: Monitor with routine checks.  9. Closed reduction of Left shoulder dislocation: WBAT LUE with platform walker with gentle L shoulder motion-  pendulums, abduction and FF.Unrestricted ROM L elbow, forearm, wrist and hand . 10. Open distal Tib/Fib fracture s/p I & d with revision: NWB LLE   -internal fixation in 10-14 days---d/w surgery  -sites clean LOS (Days) 4 A FACE TO FACE EVALUATION WAS PERFORMED  Itsel Opfer T 04/04/2015 8:27 AM

## 2015-04-04 NOTE — Progress Notes (Signed)
[facility] Progress Note  Subjective  Doing well Sore In PT gym now   Review of Systems  Constitutional: Negative for fever and chills.  Respiratory: Negative for shortness of breath and wheezing.   Cardiovascular: Negative for chest pain and palpitations.  Gastrointestinal: Negative for nausea, vomiting and abdominal pain.  Neurological: Negative for headaches.     Objective    BP 137/85 mmHg  Pulse 80  Temp(Src) 98.6 F (37 C) (Oral)  Resp 18  Ht 5\' 9"  (1.753 m)  Wt 83.915 kg (185 lb)  BMI 27.31 kg/m2  SpO2 96%  Intake/Output      06/19 0701 - 06/20 0700 06/20 0701 - 06/21 0700   P.O. 720    Total Intake(mL/kg) 720 (8.6)    Urine (mL/kg/hr) 1750 (0.9)    Stool 0 (0)    Total Output 1750     Net -1030          Urine Occurrence 2 x    Stool Occurrence 1 x       Exam  Gen: NAD, appears well, in therapy gym : Ext:  Left Upper Extremity                            Motor and sensory functions grossly intact             Ext warm             + radial pulse               Elbow ROM improved               Pt tolerated gentle abduction and FF of shoulder        Left Lower Extremity               Ex fix stable and intact, dressing stable                           Moderate swelling still present to L lower extremity               skin does not really wrinkle along posterior leg yet              DPN, SPN, TN sensation grossly intact             Ext warm             EHL, FHL, lesser toe motor functions intact     Assessment and Plan   POD/HD#: 65   64 y/o RHD male s/p fall off ladder    1. Grade 3 open left distal tib-fib fracture status post I&D and external fixation revision, closed left anterior shoulder dislocation status post re-reduction                          WBAT L upper extremity with platform walker               NWB L LEx                      Gentle L shoulder motion- pendulums, abduction and FF             Unrestricted  ROM L elbow, forearm, wrist and hand               Pt can use  L arm to assist with mobilization               Continue with ice and elevation             pinsite care daily or every other day  [facility] for pt to shower with ex fix uncovered, re-wrap after shower    Recheck soft tissue on 04/06/2015  2. Pain management:             Continue with current management  3. ABL anemia/Hemodynamics             Stable    4. Medical issues              acute ischemia left medial occipital lobe, likely embolism to the left PCA.                         Per Stroke team                         Pt started on plavix as this is his 2nd cryptogenic stroke                                                   hypercoag labs show anomalies with protein s, protein c adn antithrombin III.    5. DVT/PE prophylaxis:             continue lovenox, can dc on 04/07/2015  6. ID:               IV abx completed for open fracture treatment  7. Metabolic Bone Disease:             vitamin d levels borderline low                         Add vitamin d 3 1000 IUs bid                           Add vitamin d 2 50000 IUs daily x 8 weeks                           Hgb A1c 5.9%  8. Activity:             Activity as tolerated             Non-Weightbearing left lower extremity             WBAT L upper extremity with platform                 9. FEN/Foley/Lines:           reg diet as tolerated   10.Ex-fix/Splint care:             ok to manipulate extremity by fixator             Change pinsite dressings as needed  11. Skin rash             improved               12. Dispo:  continue with rehab  Re-eval on 04/06/2015     Justin Pigg, PA-C [facility] [phone] (O) 04/04/2015 8:46 AM

## 2015-04-04 NOTE — Progress Notes (Signed)
Physical Therapy Session Note  Patient Details  Name: Justin [name] MRN: [id number] Date of Birth: 27-Jan-1951  Today's Date: 04/04/2015 PT Individual Time: 0800-0900 PT Individual Time Calculation (min): 60 min   Short Term Goals: Week 1:  PT Short Term Goal 1 (Week 1): Pt will demonstrate w/c to/from bed transfer with L PFRW req supervision.  PT Short Term Goal 2 (Week 1): Pt will complete 2 steps with PFRW and 1 person assist PT Short Term Goal 3 (Week 1): Pt will ambulate 76' with PFRW and min A.  PT Short Term Goal 4 (Week 1): Pt will self propel manual w/c at appropriate speeds indoors req SBA for safety (200') PT Short Term Goal 5 (Week 1): Pt will tolerate 10 consecutive minutes on Nu-step without rest break for improved cardiorespiratory endurance.   Skilled Therapeutic Interventions/Progress Updates:    Session focused on overall endurance/activity tolerance, functional transfers including toileting with L PFRW at S level for safety with balance, gait training with LFPRW and close S for balance and cues for safety x 15' x2, and x 50', supine therex for LE strengthening (5# ankle weight on RLE and no weight on LLE) including heel slides, hip abduction/adduction, SAQ, and SLR x 10 reps x 3 sets with rest breaks as needed due to fatigue, and w/c propulsion for overall functional mobility and endurance training (R hemi technique to limit use of LUE). Positioned in recliner end of session with all needs in reach.   Therapy Documentation Precautions:  Precautions Precautions: Shoulder, Fall Type of Shoulder Precautions: s/p anterior shoulder subluxation and reductions x 2, pendulums, gentle ROM L shoulder--abd and FF, may use elbow to hand freely, may use L UE for mobilization and use PFRW Shoulder Interventions: Shoulder sling/immobilizer Precaution Booklet Issued: No Precaution Comments: WBAT L upper extremity with platform - Per [name], PA Required Braces or Orthoses: Other  Brace/Splint Other Brace/Splint: External fixator on LLE  Restrictions Weight Bearing Restrictions: Yes LUE Weight Bearing: Weight bearing as tolerated LLE Weight Bearing: Non weight bearing   Pain: Premedicated for pain in LLE and LUE with c/o overall "soreness" but does not limit session.  See FIM for current functional status  Therapy/Group: Individual Therapy  [name], PT, DPT  04/04/2015, 9:08 AM

## 2015-04-04 NOTE — Progress Notes (Signed)
Occupational Therapy Session Note  Patient Details  Name: Justin [name] MRN: [id number] Date of Birth: [birth year]  Today's Date: 04/04/2015 OT Individual Time: 1100-1200 and 1300-1415 OT Individual Time Calculation (min): 60 min and 75 min   Short Term Goals: Week 1:  OT Short Term Goal 1 (Week 1): STG=LTG due to anticipated short LOS  Skilled Therapeutic Interventions/Progress Updates:    Session One: Pt seen for OT ADL bathing and dressing session. Pt in recliner upon arrival, agreeable to tx. Pt verbalized being cleared for shower and encouraged from MD to shower over ex-fix and rinse wound. Bandages doffed by therapist. Pt ambulated into shower with platform walker and supervision. Pt completed seated showering task supervision-mod I. Pt dressed seated on shower chair, with steadying assist to pull pants up. Pt completed standing grooming task at the sink with supervision, using B UEs simultaneously without LOB. Pt returned to recliner with supervision. While RN applied dressing, ROM and UE strengthening exercises completed level I theraband. Pt voiced increased muscle tightness/ soreness in L UE, unable to bring L UE through full AROM, however UE able to be ranged through full ROM without pain. Pt completed chest expansion and internal/external rotation completed with theraband with VCs for technique. Pt left sitting in recliner at end of session, all needs in reach.   Session Two: Pt seen for OT session focusing on functional transfers, functional standing balance, and activity tolerance. Pt sitting up in recliner upon arrival, agreeable to tx. Pt transferred to w/c with supervision using platform walker and self propelled w/c to ADL apartment using R UE and LE. In apartment, pt completed simulated shower stall transfer over 4" step. Problem solving regarding best approach to enter shower, with pt attempting approach from the side and hopping backwards. Pt completed transfers using both  approaches with CGA. Recommending pt enter in sideways at this time, however, may change passed on pt's progression and increased confidence with mobility. Pt taken to therapy gym in w/c and completed squat pivot transfer to therapy mat with VCs for technique. Pt stood to place horse shoes on overhead basketball net in order to increase functional standing balance and endurance. Pt stood with steadying assist, and completed task using R UE, pt completed task with L UE, however, declined use of L UE due to discomfort/ "tightness" in L UE bicep area. Pt tolerated x2 standing trials of ~2 minutes each with seated rest break btwn sets. Pt returned to supine on mat to have R LE ACE bandage redone as he complained of it being too tight, RN gave permission for therapist to re-wrap. Pt and caregiver provided education regarding figure 8 wrapping for edema management. Pt then completed gentle ROM exercises/ stretches for L UE, pt educated regarding self ROM exercises/ stretches. Pt denies pain in shoulder, however complains of muscle tightness/ soreness in bicep area. Pt returned to EOM with supervision and completed standing horse shoe toss with steadying assist, required to reach in various planes to obtain horseshoe utilizing B UEs during task with good functional standing balance. Pt returned to w/c at end of session with supervision. He self propelled to room, and ambulated into room with platform RW and close supervision and returned to recliner. Pt left in recliner, LEs elevated, and all needs in reach.    Education provided regarding energy conservation, POC, OT goals, ROM exercises, and d/c planning.   Therapy Documentation Precautions:  Precautions Precautions: Shoulder, Fall Type of Shoulder Precautions: s/p anterior shoulder subluxation and reductions  x 2, pendulums, gentle ROM L shoulder--abd and FF, may use elbow to hand freely, may use L UE for mobilization and use PFRW Shoulder Interventions: Shoulder  sling/immobilizer Precaution Booklet Issued: No Precaution Comments: WBAT L upper extremity with platform - Per [name], PA Required Braces or Orthoses: Other Brace/Splint Other Brace/Splint: External fixator on LLE  Restrictions Weight Bearing Restrictions: Yes LUE Weight Bearing: Weight bearing as tolerated LLE Weight Bearing: Non weight bearing Pain: Pain Assessment Pain Score: 7  Pain Type: Acute pain;Surgical pain Pain Location: Leg Pain Orientation: Left Pain Descriptors / Indicators: Aching Pain Frequency: Intermittent Pain Onset: On-going Patients Stated Pain Goal: 3 Pain Intervention(s): RN made aware;Repositioned;Ambulation/increased activity;Shower Multiple Pain Sites: No ADL: ADL ADL Comments: see FIM  See FIM for current functional status  Therapy/Group: Individual Therapy  Lewis, Daylani Deblois C 04/04/2015, 7:26 AM

## 2015-04-05 ENCOUNTER — Inpatient Hospital Stay (HOSPITAL_COMMUNITY): Payer: BLUE CROSS/BLUE SHIELD | Admitting: Occupational Therapy

## 2015-04-05 ENCOUNTER — Inpatient Hospital Stay (HOSPITAL_COMMUNITY): Payer: BLUE CROSS/BLUE SHIELD

## 2015-04-05 ENCOUNTER — Inpatient Hospital Stay (HOSPITAL_COMMUNITY): Payer: BLUE CROSS/BLUE SHIELD | Admitting: *Deleted

## 2015-04-05 NOTE — Progress Notes (Addendum)
[place] PHYSICAL MEDICINE & REHABILITATION     PROGRESS NOTE    Subjective/Complaints: Left shoulder still sore. Having difficulty lifting up.   ROS: Pt denies fever, rash/itching, headache, blurred or double vision, nausea, vomiting, abdominal pain, diarrhea, chest pain, shortness of breath, palpitations, dysuria, dizziness, neck or back pain, bleeding, anxiety, or depression   Objective: Vital Signs: Blood pressure 138/76, pulse 93, temperature 97.5 F (36.4 C), temperature source Oral, resp. rate 18, height 5\' 9"  (1.753 m), weight 83.915 kg (185 lb), SpO2 96 %. No results found. No results for input(s): WBC, HGB, HCT, PLT in the last 72 hours. No results for input(s): NA, K, CL, GLUCOSE, BUN, CREATININE, CALCIUM in the last 72 hours.  Invalid input(s): CO CBG (last 3)  No results for input(s): GLUCAP in the last 72 hours.  Wt Readings from Last 3 Encounters:  03/31/15 83.915 kg (185 lb)  03/28/15 85 kg (187 lb 6.3 oz)  02/21/15 85.639 kg (188 lb 12.8 oz)    Physical Exam:  Constitutional: He is oriented to person, place, and time. He appears well-developed and well-nourished.  HENT:  Head: Normocephalic and atraumatic.  Eyes: Conjunctivae are normal. Pupils are equal, round, and reactive to light.  Neck: Normal range of motion. Neck supple.  Cardiovascular: Normal rate and regular rhythm.  No murmur heard. Respiratory: Effort normal and breath sounds normal. No respiratory distress. He has no wheezes.  GI: Soft. Bowel sounds are normal. He exhibits no distension. There is no tenderness.  Musculoskeletal:  External fixator on Left ankle with dry dressing.   Left shoulder pain with AROM/PROM--can't lift above 50 degrees abduction  Neurological: He is alert and oriented to person, place, and time.  Speech clear. Able to follow commands without difficulty. Finger to nose intact RUE. Right lower quadrantnopsia improved. RUE and RLE strength grossly . Sensation  appears grossly intact. Has good insight and awareness  Skin: Skin is warm and dry. Rash improved. Pin sites clean Psychiatric: He has a normal mood and affect. His behavior is normal. Judgment and thought content normal  Assessment/Plan: 1. Functional deficits secondary to embolic bilateral CVA, left tib-fib fx with ex-fix, left shoulder dislocation which require 3+ hours per day of interdisciplinary therapy in a comprehensive inpatient rehab setting. Physiatrist is providing close team supervision and 24 hour management of active medical problems listed below. Physiatrist and rehab team continue to assess barriers to discharge/monitor patient progress toward functional and medical goals.  FIM: FIM - Bathing Bathing Steps Patient Completed: Chest, Right Arm, Left Arm, Abdomen, Front perineal area, Buttocks, Right upper leg, Left upper leg, Right lower leg (including foot), Left lower leg (including foot) Bathing: 5: Supervision: Safety issues/verbal cues  FIM - Upper Body Dressing/Undressing Upper body dressing/undressing steps patient completed: Pull shirt around back of front closure shirt/dress, Button/unbutton shirt, Thread/unthread left sleeve of front closure shirt/dress Upper body dressing/undressing: 4: Min-Patient completed 75 plus % of tasks FIM - Lower Body Dressing/Undressing Lower body dressing/undressing steps patient completed: Thread/unthread right underwear leg, Thread/unthread left underwear leg Lower body dressing/undressing: 4: Min-Patient completed 75 plus % of tasks  FIM - Toileting Toileting steps completed by patient: Adjust clothing prior to toileting, Performs perineal hygiene, Adjust clothing after toileting Toileting Assistive Devices: Grab bar or rail for support Toileting: 5: Supervision: Safety issues/verbal cues  FIM - Radio producer Devices: Grab bars, Insurance account manager Transfers: 4-To toilet/BSC: Min A (steadying Pt. > 75%),  4-From toilet/BSC: Min A (steadying Pt. > 75%)  FIM - Control and instrumentation engineer Devices: Copy: 6: Supine > Sit: No assist, 6: Sit > Supine: No assist, 5: Bed > Chair or W/C: Supervision (verbal cues/safety issues), 5: Chair or W/C > Bed: Supervision (verbal cues/safety issues)  FIM - Locomotion: Wheelchair Distance: 200 Locomotion: Wheelchair: 5: Travels 150 ft or more: maneuvers on rugs and over door sills with supervision, cueing or coaxing FIM - Locomotion: Ambulation Locomotion: Ambulation Assistive Devices: Engineer, agricultural Ambulation/Gait Assistance: 5: Supervision Locomotion: Ambulation: 2: Travels 50 - 149 ft with supervision/safety issues  Comprehension Comprehension Mode: Auditory Comprehension: 7-Follows complex conversation/direction: With no assist  Expression Expression Mode: Verbal Expression: 7-Expresses complex ideas: With no assist  Social Interaction Social Interaction: 7-Interacts appropriately with others - No medications needed.  Problem Solving Problem Solving: 7-Solves complex problems: Recognizes & self-corrects  Memory Memory: 7-Complete Independence: No helper    Medical Problem List and Plan: 1. Functional deficits secondary to embolic bilateral CVA's, left tib-fib fx, and left shoulder dislocation 2. DVT Prophylaxis/Anticoagulation: Pharmaceutical: Lovenox through 6/23? 3. Pain Management: Continue oxycodone prn with robaxin as needed for muscle spasms.  4. Mood: LCSW to follow for evaluation and support.  5. Neuropsych: This patient is capable of making decisions on his own behalf. 6. Skin/Wound Care: continue Routine pin care bid. Sites clean 7. Fluids/Electrolytes/Nutrition: Monitor I/O. Labs reviewed Po intake appears to be good.   -vit d supp per ortho 8. ABLA: Monitor with routine checks.  9. Closed reduction of Left shoulder dislocation: WBAT LUE with platform walker with gentle L shoulder  motion- pendulums, abduction and FF.Unrestricted ROM L elbow, forearm, wrist and hand .  -RTC injury? 10. Open distal Tib/Fib fracture s/p I & d with revision: NWB LLE   -internal fixation timing---end of week  -sites clean LOS (Days) 5 A FACE TO FACE EVALUATION WAS PERFORMED  Justin [name] T 04/05/2015 7:54 AM

## 2015-04-05 NOTE — Progress Notes (Signed)
Physical Therapy Session Note  Patient Details  Name: Justin [name] MRN: [id number] Date of Birth: [birth year]  Today's Date: 04/05/2015 PT Individual Time: 0800-0915 PT Individual Time Calculation (min): 75 min   Short Term Goals: Week 1:  PT Short Term Goal 1 (Week 1): Pt will demonstrate w/c to/from bed transfer with L PFRW req supervision.  PT Short Term Goal 2 (Week 1): Pt will complete 2 steps with PFRW and 1 person assist PT Short Term Goal 3 (Week 1): Pt will ambulate 27' with PFRW and min A.  PT Short Term Goal 4 (Week 1): Pt will self propel manual w/c at appropriate speeds indoors req SBA for safety (200') PT Short Term Goal 5 (Week 1): Pt will tolerate 10 consecutive minutes on Nu-step without rest break for improved cardiorespiratory endurance.   Skilled Therapeutic Interventions/Progress Updates:   session focused on functional endurance and activity tolerance with mobility, functional gait in room to and from bathroom with close S for safety with balance using L PFRW, transfers with PFRW and squat pivot technique to simulate household situations at S level, stair negotiation training and problem solving best and safest technique for home entry, and supine therex for strengthening and ROM including heel slides, SAQ, hip abduction/adduction, SLR and LAQ x 10 reps x 3 sets each with 5# weight on RLE and no weight on LLE. Demonstrated various techniques for stair negotiation (2 steps with R rail) using PFRW going backwards, rail on R, w/c bump up and then decided on placing chair (w/c) at top of step and pt only having to ascend 1 step with PFRW backwards and then sit in the w/c. Pt states he feels more comfortable with this idea vs using PFRW going backwards due to his wife's small stature. Pt able to practice going up/down backwards x 4 reps with close S/steady A for balance. Returned to room end of session with all needs in reach.   Measured pt for rental w/c to plan for d/c  home.  Therapy Documentation Precautions:  Precautions Precautions: Shoulder, Fall Type of Shoulder Precautions: s/p anterior shoulder subluxation and reductions x 2, pendulums, gentle ROM L shoulder--abd and FF, may use elbow to hand freely, may use L UE for mobilization and use PFRW Shoulder Interventions: Shoulder sling/immobilizer Precaution Booklet Issued: No Precaution Comments: WBAT L upper extremity with platform - Per [name], PA Required Braces or Orthoses: Other Brace/Splint Other Brace/Splint: External fixator on LLE  Restrictions Weight Bearing Restrictions: Yes LUE Weight Bearing: Weight bearing as tolerated LLE Weight Bearing: Non weight bearing  Pain: Reports soreness but premedicated and able to work though without issue.  See FIM for current functional status  Therapy/Group: Individual Therapy  [name], PT, DPT  04/05/2015, 12:15 PM

## 2015-04-05 NOTE — Progress Notes (Signed)
Occupational Therapy Session Note  Patient Details  Name: Justin [name] MRN: [id number] Date of Birth: Jul 19, 1951  Today's Date: 04/05/2015 OT Individual Time: 0930-1030 and 1300-1400 OT Individual Time Calculation (min): 60 min and 60 min   Short Term Goals: Week 1:  OT Short Term Goal 1 (Week 1): STG=LTG due to anticipated short LOS  Skilled Therapeutic Interventions/Progress Updates:    Session One: Pt seen for ADL bathing and dressing session. Pt sitting up in recliner upon arrival, voicing desire to complete showering task. Pt ambulated into bathroom and completed shower transfer with PRW and supervision. Pt bathed supervision- mod I, completing lateral leans for buttock hygiene. Pt dressed seated on shower chair with supervision and VCs for safety when standing to pull pants up. Pt ambulated back to recliner with supervision and cues for management of PRW over door threshold. While nursing performed dressing change, gentle AAROM performed to L shoulder and education provided regarding self ROM exercises. Pt then completed ankle plantar/doreflexion exercises and leg raises on R LE x 10 reps with rest break in between. Pt educated regarding figure 8 ACE bandage wrapping for edema management. Pt left in recliner at end of session, all needs in reach.   Session Two: Pt seen for OT therapy focusing on functional transfers, functional standing balance, and activity tolerance. Pt in recliner upon arrival, agreeable to tx. He used PRW to transfer to w/[name] with supervision. Pt self propelled w/[name] to therapy gym using R UE and LE. In gym, pt practiced simulated shower stall transfer, hopping over 9" step using PRW with supervision. Pt completed transfer with backward approach hopping onto step. Pt voiced feeling comfortable with transfer. Pt completed x5 reps of hopping forward<> back onto 9" step. Pt then voiced desire to have leg bandage/ ace wrap reapplied. OT reapplied bandage with emphasis on  education pt regarding wrapping for edema management, pt voiced understanding. Pt then self propelled w/[name] to therapy day room, where he ambulated with PRW and supervision to water plants, demonstration functional dynamic and static standing. Pt tolerated ~2-3 minutes of standing without s/s of fatigue. Pt then stood at elevated table and completed pendulum shoulder exercises for L UE with demonstration and VCs provided for technique. Pendulum exercises completed in clockwise, counter clockwise, and forward<> back motion. Pt returned to w/[name] and propelled back to room, where he transferred into recliner with supervision. Pt left in recliner at end of session, all needs in reach.  Pt educated regarding energy conservation, need for assist, importance of planning ahead during day, functional transfers, bandage wrapping, shoulder ROM exercises, and d/[name] planning.   Therapy Documentation Precautions:  Precautions Precautions: Shoulder, Fall Type of Shoulder Precautions: s/p anterior shoulder subluxation and reductions x 2, pendulums, gentle ROM L shoulder--abd and FF, may use elbow to hand freely, may use L UE for mobilization and use PFRW Shoulder Interventions: Shoulder sling/immobilizer Precaution Booklet Issued: No Precaution Comments: WBAT L upper extremity with platform - Per [name], PA Required Braces or Orthoses: Other Brace/Splint Other Brace/Splint: External fixator on LLE  Restrictions Weight Bearing Restrictions: Yes LUE Weight Bearing: Weight bearing as tolerated LLE Weight Bearing: Non weight bearing Pain: Pain Assessment Pain Score: 7  Pain Location: Leg Pain Orientation: Left Pain Descriptors / Indicators: Aching Pain Intervention(s): Repositioned;Ambulation/increased activity ADL: ADL ADL Comments: see FIM  See FIM for current functional status  Therapy/Group: Individual Therapy  Lewis, Justin [name] 04/05/2015, 7:15 AM

## 2015-04-05 NOTE — Progress Notes (Signed)
Recreational Therapy Session Note  Patient Details  Name: JAYDIS DUCHENE MRN: [id number] Date of Birth: 04/23/1951 Today's Date: 04/05/2015   Order received, chart reviewed.  Discussion with pt about TR services and use of leisure time post discharge with potential adaptations.  Full eval deferred as pt expected to discharge to acute care for surgery in the next few day.  Will continue to monitor through team. Mccoy Testa 04/05/2015, 3:16 PM

## 2015-04-06 ENCOUNTER — Inpatient Hospital Stay (HOSPITAL_COMMUNITY): Payer: BLUE CROSS/BLUE SHIELD | Admitting: Occupational Therapy

## 2015-04-06 ENCOUNTER — Inpatient Hospital Stay (HOSPITAL_COMMUNITY): Payer: BLUE CROSS/BLUE SHIELD

## 2015-04-06 NOTE — Progress Notes (Signed)
Physical Therapy Session Note  Patient Details  Name: Justin [name] MRN: [id number] Date of Birth: Jun 26, 1951  Today's Date: 04/06/2015 PT Individual Time: 0900-1000 PT Individual Time Calculation (min): 60 min   Short Term Goals: Week 1:  PT Short Term Goal 1 (Week 1): Pt will demonstrate w/c to/from bed transfer with L PFRW req supervision.  PT Short Term Goal 2 (Week 1): Pt will complete 2 steps with PFRW and 1 person assist PT Short Term Goal 3 (Week 1): Pt will ambulate 9' with PFRW and min A.  PT Short Term Goal 4 (Week 1): Pt will self propel manual w/c at appropriate speeds indoors req SBA for safety (200') PT Short Term Goal 5 (Week 1): Pt will tolerate 10 consecutive minutes on Nu-step without rest break for improved cardiorespiratory endurance.   Skilled Therapeutic Interventions/Progress Updates:   Session focused on family education and discharge planning with patient and pt's wife. PA [name] in room upon start of session and discussed d/c plan to go home end of week and return for sx 7/5. Reviewed with pt and wife, equipment recommendations, demonstrated functional transfers at S progressing to mod I level with PFRW, short distance household gait with PFRW, car transfer and recommendations to have pillows in the car for elevating LLE during ride, and demonstrated simulated stair set-up for home entry using w/c at top to sit in and hopping backwards up 1 step with PFRW at S level. Pt and wife feel comfortable with current plan and decline any further concerns. Also reviewed HEP with pt's wife and checked her off on safety plan to assist pt with basic transfers in the room. Notified CSW of change in d/c plan and follow up recommendations (wait for HHPT until after sx).   Therapy Documentation Precautions:  Precautions Precautions: Shoulder, Fall Type of Shoulder Precautions: s/p anterior shoulder subluxation and reductions x 2, pendulums, gentle ROM L shoulder--abd and FF,  may use elbow to hand freely, may use L UE for mobilization and use PFRW Shoulder Interventions: Shoulder sling/immobilizer Precaution Booklet Issued: No Precaution Comments: WBAT L upper extremity with platform - Per [name], PA Required Braces or Orthoses: Other Brace/Splint Other Brace/Splint: External fixator on LLE  Restrictions Weight Bearing Restrictions: Yes LUE Weight Bearing: Weight bearing as tolerated LLE Weight Bearing: Non weight bearing Pain: Premedicated for pain.   Locomotion : Ambulation Ambulation/Gait Assistance: 5: Supervision   See FIM for current functional status  Therapy/Group: Individual Therapy  [name], PT, DPT  04/06/2015, 12:07 PM

## 2015-04-06 NOTE — Progress Notes (Signed)
Occupational Therapy Session Note  Patient Details  Name: Justin [name] MRN: [id number] Date of Birth: 07/19/1951  Today's Date: 04/06/2015 OT Individual Time: [phone] and 1330-1430 OT Individual Time Calculation (min): 60 min and 60 min    Short Term Goals: Week 1:  OT Short Term Goal 1 (Week 1): STG=LTG due to anticipated short LOS  Skilled Therapeutic Interventions/Progress Updates:    Session One: Pt seen for ADL bathing and dressing session with emphasis on functional transfers and family education. Pt in recliner upon arrival, agreeable to tx. Pt showed pictures to therapist of home bathroom. Following education and recommendation from OT in previous session, Pt now plans to use tub shower combo for bathing use at home. Pt transferred from recliner to w/c with supervision using PRW and self-propelled w/c to therapy gym using R UE and LE. Pt ambulated into apartment bathroom with supervision, going in sideways as pt's home bathroom is not wide enough to accommodate RW. Pt completed tub shower transfer with supervision and VCs for technique. Pt then completed toilet transfer, ambulating with PRW. VCs provided for safety and problem solving for controlled descent. Pt has counter to L sid eon home toilet, however due to shoulder pain, is not able to use it functionally to help assist in control sitting. Pt ambulated out of bathroom in same manner as he entered. Wife present for education regarding transfer and verbalized feeling more comfortable with tub shower transfer vs shower stall transfer. Pt returned to room in w/c where he gathered clothing from drawers from w/c level mod I. He ambulated into bathroom and completed shower transfer with supervision. He bathed mod I, completing buttock hygiene seated via lateral leans. Pt dressed seated on shower chair with supervision when standing to complete LB dressing. Pt ambulated out of bathroom to recliner with supervision. In recliner, pt completed  L UE PROM/AAROM  and RLE ankle  Pumps, alphabet letters" with R foot while nursing completed dressing changes. Pt left sitting in recliner at end of session, all needs in reach.    Session Two: Pt seen for OT session focusing on functional/community w/c mobility and functional activity tolerance. Pt sitting up in recliner upon arrival, agreeable to tx. Pt voiced desire to practice w/c management in household/ community environment. Pt transferred into w/c with supervision using [place]. Pt self propelled w/c throughout hospital and off unit, navigating on/ off elevator and through hospital gift shop with supervision and VCs for w/c management. Pt navigated over uneven surfaces including door jams and elevator,  With supervision. Pt required rest breaks throughout due to decrease activity tolerance, and min A when propelling up long ramp. Pt demonstrated good carry over of education from previous sessions and educated further regarding energy conservation and building up functional activity tolerance. Pt returned to unit and practiced w/c management and accessing kitchen cabinets from w/c level. Pt provided with reacher and demonstrated ability to remove and replace items in kitchen cabinets with VCs for safety awareness. Education provided regarding home kitchen layout, recommending to put commonly used items on lower shelves. Pt then transferred from w/c to low soft surface couch in ADL apartment. Pt required min A after getting ex-fix pin stuck on w/c. Pt agreeable to put brightly colored tape around pins of ex-fix to draw attention to pins during functional task. Tape applied by therapist. Pt returned to w/c with supervision, with pt setting up w/c in prep for transfers with min VCs. Pt self propelled w/c back to room mod I.  Pt stood at sink with steadying assist to complete shoulder pendulum exercises, demonstrating good recall of exercises from yesterdays session. Pt educated regarding exercises to complete for  shoulder mobilization upon d/c, voicing and demonstrating understanding. Pt returned to recliner at end of session with supervision. Pt left in recliner at end of session, all needs in reach.   Therapy Documentation Precautions:  Precautions Precautions: Shoulder, Fall Type of Shoulder Precautions: s/p anterior shoulder subluxation and reductions x 2, pendulums, gentle ROM L shoulder--abd and FF, may use elbow to hand freely, may use L UE for mobilization and use PFRW Shoulder Interventions: Shoulder sling/immobilizer Precaution Booklet Issued: No Precaution Comments: WBAT L upper extremity with platform - Per [name], PA Required Braces or Orthoses: Other Brace/Splint Other Brace/Splint: External fixator on LLE  Restrictions Weight Bearing Restrictions: Yes LUE Weight Bearing: Weight bearing as tolerated LLE Weight Bearing: Non weight bearing  Pain Assessment Pain Assessment: 0-10 Pain Score: 7  Pain Type: Acute pain Pain Location: Leg Pain Orientation: Left Pain Descriptors / Indicators: Aching Pain Frequency: Constant Pain Onset: On-going Patients Stated Pain Goal: 3 Pain Intervention(s): Repositioned;Ambulation/increased activity Multiple Pain Sites: No ADL: ADL ADL Comments: see FIM     See FIM for current functional status  Therapy/Group: Individual Therapy  Lewis, Osie Merkin C 04/06/2015, 6:58 AM

## 2015-04-06 NOTE — Progress Notes (Signed)
Recreational Therapy Session Note  Patient Details  Name: Justin [name] MRN: [id number] Date of Birth: 1951/07/21 Today's Date: 04/06/2015   Pt participated in animal assisted activity/therapy seated w/c level with Mod I.  Lorma Heater 04/06/2015, 3:37 PM

## 2015-04-06 NOTE — Progress Notes (Signed)
[facility] Progress Note  Subjective  Doing well Good spirits   Review of Systems  Constitutional: Negative for fever and chills.  Respiratory: Negative for shortness of breath and wheezing.   Cardiovascular: Negative for chest pain and palpitations.  Gastrointestinal: Negative for nausea, vomiting and abdominal pain.  Neurological: Negative for headaches.     Objective   BP 133/71 mmHg  Pulse 87  Temp(Src) 97.7 F (36.5 C) (Oral)  Resp 19  Ht 5\' 9"  (1.753 m)  Wt 83.915 kg (185 lb)  BMI 27.31 kg/m2  SpO2 94%  Intake/Output      06/21 0701 - 06/22 0700 06/22 0701 - 06/23 0700   P.O. 960    Total Intake(mL/kg) 960 (11.4)    Urine (mL/kg/hr) 1300 (0.6)    Stool 0 (0)    Total Output 1300     Net -340          Urine Occurrence 3 x    Stool Occurrence 3 x       Exam  Gen: awake, alert, NAD, sitting in bedside chair, wife with pt: Ext:       Left Lower Extremity   Ex fix stable  All pinsites look great  Traumatic medial wound stable  Moderate swelling still present  Improving swelling but still not adequate for surgery  DPN, SPN, TN sensation grossly intact  EHL, FHL, Lesser toe motor functions intact  Ext warm  + DP pulse   Left Upper Extremity                            Motor and sensory functions grossly intact             Ext warm             + radial pulse               Elbow ROM improved               Pt tolerated gentle abduction and FF of shoulder    Assessment and Plan   POD/HD#: 86   64 y/o RHD male s/p fall off ladder    1. Grade 3 open left distal tib-fib fracture status post I&D and external fixation revision, closed left anterior shoulder dislocation status post re-reduction                          WBAT L upper extremity with platform walker               NWB L LEx                      Gentle L shoulder motion- pendulums, abduction and FF             Unrestricted ROM L elbow, forearm, wrist and hand      Pt can use L arm to assist with mobilization               Continue with ice and elevation             pinsite care daily or every other day             Ok for pt to shower with ex fix uncovered, re-wrap after shower  plan for OR on 04/19/2015 as soft tissues not ready for this week  Discussed with pt and wife and they are ok with this   They will call office with any concerns in the interim  Ok to dc pt home at this point if cleared by CIR team   Pt will continue plavix up to and through surgery, we will operate through plavix and use tourniquet if necessary   2. Pain management:             Continue with current management  3. ABL anemia/Hemodynamics             Stable    4. Medical issues              acute ischemia left medial occipital lobe, likely embolism to the left PCA.                         Per Stroke team                         Pt started on plavix as this is his 2nd cryptogenic stroke                                                   hypercoag labs show anomalies with protein s, protein c adn antithrombin III.    5. DVT/PE prophylaxis:             continue lovenox, can dc on 04/07/2015  6. ID:               IV abx completed for open fracture treatment  7. Metabolic Bone Disease:             vitamin d levels borderline low                         Add vitamin d 3 1000 IUs bid                           Add vitamin d 2 50000 IUs daily x 8 weeks                           Hgb A1c 5.9%  8. Activity:             Activity as tolerated             Non-Weightbearing left lower extremity             WBAT L upper extremity with platform                 9. FEN/Foley/Lines:           reg diet as tolerated   10.Ex-fix/Splint care:             ok to manipulate extremity by fixator             Change pinsite dressings as needed  11. Skin rash             improved                12. Dispo:  ok to dc home at this point  OR  on 04/19/2015    [name], PA-C [facility] [phone] (O) 04/06/2015 9:15 AM

## 2015-04-06 NOTE — Patient Care Conference (Signed)
Inpatient RehabilitationTeam Conference and Plan of Care Update Date: 04/05/2015   Time: 2:20 PM    Patient Name: Justin [name]      Medical Record Number: [id number]  Date of Birth: 05-05-1951 Sex: Male         Room/Bed: 4M10C/4M10C-01 Payor Info: Payor: BLUE CROSS BLUE SHIELD / Plan: BCBS OTHER / Product Type: *No Product type* /    Admitting Diagnosis: L CVA L pilon fx and L shoulder dislocation   Admit Date/Time:  03/31/2015  4:01 PM Admission Comments: No comment available   Primary Diagnosis:  Embolic cerebral infarction Principal Problem: Embolic cerebral infarction  Patient Active Problem List   Diagnosis Date Noted  . Dislocation of left shoulder joint 03/31/2015  . Embolic cerebral infarction 03/31/2015  . Cerebral infarction due to embolism of cerebral artery   . Embolic stroke   . PFO (patent foramen ovale)   . Open displaced pilon fracture of left tibia, type III 03/23/2015  . Open fracture of left tibia and fibula 03/22/2015  . LINQ reveal 08/04/2013  . Mild hyperlipidemia 05/11/2013  . Colon cancer screening 05/11/2013  . H/O: CVA (cerebrovascular accident) 04/22/2013  . Bell's palsy 04/22/2013    Expected Discharge Date: Expected Discharge Date: 04/08/15  Team Members Present: Physician leading conference: Dr. Alger [name] Social Worker Present: Justin Pall, LCSW Nurse Present: Justin Pupa, RN PT Present: Justin [name], PT OT Present: Justin [name], OT;Other (comment) Justin [name], OT) SLP Present: Justin [name], SLP PPS Coordinator present : Justin Nakayama, RN, CRRN     Current Status/Progress Goal Weekly Team Focus  Medical   left tibfib fx s/p ex fix. post-op cva   stabilize medically for transfer back to acute/surgery  wound care, cva proph, pain mgt   Bowel/Bladder   Continent to bowel and bladder.  To continue continent to bowel and bladder with min. assisst.  To continue assessing  bladder and bowel function Q shift.   Swallow/Nutrition/  Hydration             ADL's   Supervision functional mobility and transfers; supervision-min A standing LB dressing tasks  Overall supervision- mod I  Dynamic standing balance, functional activity tolerance, shower transfer   Mobility   S to steady A overall using PFRW  mod I overall; min A stairs  strengthening, endurance, gait training to tolerance, stairs, w/c propulsion   Communication             Safety/Cognition/ Behavioral Observations            Pain   Complain of pain on left leg.Using Oxi IR 10 mg. Q 4 PRN.Tramadol 50 mg and Robaxin 500 Q 6 hrs. PRN.  To keep pain level less than 3 on scale 1 to 10.  To assess for pain levels Q 2 hrs,and response to activity.   Skin   External fixator on left leg with every other day dressing change and PRN.  To keep skin free of infection and free of breakdown.  To continue with dressing changes as directed,and assess skin Q shift.    Rehab Goals Patient on target to meet rehab goals: Yes *See Care Plan and progress notes for long and short-term goals.  Barriers to Discharge: timing of surgery    Possible Resolutions to Barriers:  communication with surgery regarding date    Discharge Planning/Teaching Needs:  home with wife who can provide 24/7 assistance      Team Discussion:  Recovering and compenstaing much better than  anticipated.  Timing of surger TBD.  Goals set for mod i and close to these now.  Revisions to Treatment Plan:  None   Continued Need for Acute Rehabilitation Level of Care: The patient requires daily medical management by a physician with specialized training in physical medicine and rehabilitation for the following conditions: Daily direction of a multidisciplinary physical rehabilitation program to ensure safe treatment while eliciting the highest outcome that is of practical value to the patient.: Yes Daily medical management of patient stability for increased activity during participation in an intensive  rehabilitation regime.: Yes Daily analysis of laboratory values and/or radiology reports with any subsequent need for medication adjustment of medical intervention for : Post surgical problems;Neurological problems  Justin [name] 04/06/2015, 12:42 PM

## 2015-04-06 NOTE — Progress Notes (Signed)
[place] PHYSICAL MEDICINE & REHABILITATION     PROGRESS NOTE    Subjective/Complaints: Feeling well. Last night was a little "noisy" so he didn't sleep real well.   ROS: Pt denies fever, rash/itching, headache, blurred or double vision, nausea, vomiting, abdominal pain, diarrhea, chest pain, shortness of breath, palpitations, dysuria, dizziness, neck or back pain, bleeding, anxiety, or depression   Objective: Vital Signs: Blood pressure 133/71, pulse 87, temperature 97.7 F (36.5 C), temperature source Oral, resp. rate 19, height 5\' 9"  (1.753 m), weight 83.915 kg (185 lb), SpO2 94 %. No results found. No results for input(s): WBC, HGB, HCT, PLT in the last 72 hours. No results for input(s): NA, K, CL, GLUCOSE, BUN, CREATININE, CALCIUM in the last 72 hours.  Invalid input(s): CO CBG (last 3)  No results for input(s): GLUCAP in the last 72 hours.  Wt Readings from Last 3 Encounters:  03/31/15 83.915 kg (185 lb)  03/28/15 85 kg (187 lb 6.3 oz)  02/21/15 85.639 kg (188 lb 12.8 oz)    Physical Exam:  Constitutional: He is oriented to person, place, and time. He appears well-developed and well-nourished.  HENT:  Head: Normocephalic and atraumatic.  Eyes: Conjunctivae are normal. Pupils are equal, round, and reactive to light.  Neck: Normal range of motion. Neck supple.  Cardiovascular: Normal rate and regular rhythm.   Respiratory: Effort normal and breath sounds normal. No respiratory distress. He has no wheezes.  GI: Soft. Bowel sounds are normal. He exhibits no distension. There is no tenderness.  Musculoskeletal:  External fixator on Left ankle with dry dressing. Still some mild swelling about the leg. Left shoulder pain with AROM/PROM--can't lift above 50 degrees abduction  Neurological: He is alert and oriented to person, place, and time.  Speech clear. Able to follow commands without difficulty. Finger to nose intact RUE. Right lower quadrantnopsia improved.  RUE and RLE strength grossly . Sensation appears grossly intact. Has good insight and awareness  Skin: Skin is warm and dry. Pin sites clean Psychiatric: He has a normal mood and affect. His behavior is normal. Judgment and thought content normal  Assessment/Plan: 1. Functional deficits secondary to embolic bilateral CVA, left tib-fib fx with ex-fix, left shoulder dislocation which require 3+ hours per day of interdisciplinary therapy in a comprehensive inpatient rehab setting. Physiatrist is providing close team supervision and 24 hour management of active medical problems listed below. Physiatrist and rehab team continue to assess barriers to discharge/monitor patient progress toward functional and medical goals.  FIM: FIM - Bathing Bathing Steps Patient Completed: Chest, Right Arm, Left Arm, Abdomen, Front perineal area, Buttocks, Right upper leg, Left upper leg, Right lower leg (including foot), Left lower leg (including foot) Bathing: 5: Supervision: Safety issues/verbal cues  FIM - Upper Body Dressing/Undressing Upper body dressing/undressing steps patient completed: Pull shirt around back of front closure shirt/dress, Button/unbutton shirt, Thread/unthread left sleeve of front closure shirt/dress, Thread/unthread right sleeve of front closure shirt/dress Upper body dressing/undressing: 5: Set-up assist to: Obtain clothing/put away FIM - Lower Body Dressing/Undressing Lower body dressing/undressing steps patient completed: Thread/unthread right underwear leg, Thread/unthread left underwear leg Lower body dressing/undressing: 5: Set-up assist to: Obtain clothing  FIM - Toileting Toileting steps completed by patient: Adjust clothing prior to toileting, Performs perineal hygiene, Adjust clothing after toileting Toileting Assistive Devices: Grab bar or rail for support Toileting: 5: Supervision: Safety issues/verbal cues  FIM - Radio producer Devices: Grab  bars, Insurance account manager Transfers: 4-To toilet/BSC: Min A (steadying  Pt. > 75%), 4-From toilet/BSC: Min A (steadying Pt. > 75%)  FIM - Bed/Chair Transfer Bed/Chair Transfer Assistive Devices: Copy: 6: Supine > Sit: No assist, 6: Sit > Supine: No assist, 5: Bed > Chair or W/C: Supervision (verbal cues/safety issues), 5: Chair or W/C > Bed: Supervision (verbal cues/safety issues)  FIM - Locomotion: Wheelchair Distance: 200 Locomotion: Wheelchair: 6: Travels 150 ft or more, turns around, maneuvers to table, bed or toilet, negotiates 3% grade: maneuvers on rugs and over door sills independently FIM - Locomotion: Ambulation Locomotion: Ambulation Assistive Devices: Engineer, agricultural Ambulation/Gait Assistance: 5: Supervision Locomotion: Ambulation: 2: Travels 50 - 149 ft with supervision/safety issues  Comprehension Comprehension Mode: Auditory Comprehension: 7-Follows complex conversation/direction: With no assist  Expression Expression Mode: Verbal Expression: 7-Expresses complex ideas: With no assist  Social Interaction Social Interaction: 7-Interacts appropriately with others - No medications needed.  Problem Solving Problem Solving: 7-Solves complex problems: Recognizes & self-corrects  Memory Memory: 7-Complete Independence: No helper    Medical Problem List and Plan: 1. Functional deficits secondary to embolic bilateral CVA's, left tib-fib fx, and left shoulder dislocation 2. DVT Prophylaxis/Anticoagulation: Pharmaceutical: Lovenox through 6/23? 3. Pain Management: Continue oxycodone prn with robaxin as needed for muscle spasms.  4. Mood: LCSW to follow for evaluation and support.  5. Neuropsych: This patient is capable of making decisions on his own behalf. 6. Skin/Wound Care: continue Routine pin care bid. Sites clean 7. Fluids/Electrolytes/Nutrition: Monitor I/O. Labs reviewed Po intake appears to be good.   -vit d supp per ortho 8. ABLA: Monitor  with routine checks.  9. Closed reduction of Left shoulder dislocation: WBAT LUE with platform walker with gentle L shoulder motion- pendulums, abduction and FF.Unrestricted ROM L elbow, forearm, wrist and hand .  -RTC injury? 10. Open distal Tib/Fib fracture s/p I & d with revision: NWB LLE   -internal fixation timing---end of week?  -pin sites clean LOS (Days) 6 A FACE TO FACE EVALUATION WAS PERFORMED  Fulton Merry T 04/06/2015 8:14 AM

## 2015-04-06 NOTE — Progress Notes (Signed)
Social Work Patient ID: Justin [name], male   DOB: Jan 14, 1951, 64 y.o.   MRN: [id number]   Met with pt and wife yesterday following team conference and again this morning.  Understand now that surgery has been scheduled for 04/19/15, therefore, plan to d/c pt home on Friday @ mod I goals overall.  Have reviewed DME and HH f/u needs.  Pt and wife feeling ready for d/c.  Niki Cosman

## 2015-04-06 NOTE — Discharge Instructions (Addendum)
Inpatient Rehab Discharge Instructions  [place] Discharge date and time:    Activities/Precautions/ Functional Status: Activity: no lifting, driving, or strenuous exercise  till cleared by MD Diet: cardiac diet Wound Care: Wash with soap and water. Pat dry and apply dry dressing. Change every other day or if soiled.   Functional status:  ___ No restrictions     ___ Walk up steps independently ___ 24/7 supervision/assistance   ___ Walk up steps with assistance ___ Intermittent supervision/assistance  ___ Bathe/dress independently ___ Walk with walker     ___ Bathe/dress with assistance ___ Walk Independently    ___ Shower independently ___ Walk with assistance    ___ Shower with assistance _X__ No alcohol     ___ Return to work/school ________    COMMUNITY REFERRALS UPON DISCHARGE:    Home Health:     RN                          Agency:  [facility] Phone: [phone]    Medical Equipment/Items Ordered:  Wheelchair, cushion, rolling walker and tub bench                                                      Agency/Supplier: [facility] @ [phone]     Special Instructions: 1.No weight on left leg. Keep leg elevated when seated or in bed.  2.    My questions have been answered and I understand these instructions. I will adhere to these goals and the provided educational materials after my discharge from the hospital.  Patient/Caregiver Signature _______________________________ Date __________  Clinician Signature _______________________________________ Date __________  Please bring this form and your medication list with you to all your follow-up doctor's appointments.                                             [facility] Discharge Instructions:  WEIGHT BEARING STATUS: Nonweightbearing Left leg  RANGE OF MOTION/ACTIVITY: activity as tolerated while maintaining weightbearing restrictions.   PAIN MEDICATION USE AND  EXPECTATIONS  You have likely been given narcotic medications to help control your pain.  After a traumatic event that results in an fracture (broken bone) with or without surgery, it is ok to use narcotic pain medications to help control one's pain.  We understand that everyone responds to pain differently and each individual patient will be evaluated on a regular basis for the continued need for narcotic medications. Ideally, narcotic medication use should last no more than 6-8 weeks (coinciding with fracture healing).   As a patient it is your responsibility as well to monitor narcotic medication use and report the amount and frequency you use these medications when you come to your office visit.   We would also advise that if you are using narcotic medications, you should take a dose prior to therapy to maximize you participation.  IF YOU ARE ON NARCOTIC MEDICATIONS IT IS NOT PERMISSIBLE TO OPERATE A MOTOR VEHICLE (MOTORCYCLE/CAR/TRUCK/MOPED) OR HEAVY MACHINERY DO NOT MIX NARCOTICS WITH OTHER CNS (CENTRAL NERVOUS SYSTEM) DEPRESSANTS SUCH AS ALCOHOL  Diet: as you were eating previously.  Can use over the counter stool softeners  and bowel preparations, such as Miralax, to help with bowel movements.  Narcotics can be constipating.  Be sure to drink plenty of fluids  Wound Care:see instructions below   STOP SMOKING OR USING NICOTINE PRODUCTS!!!!  As discussed nicotine severely impairs your body's ability to heal surgical and traumatic wounds but also impairs bone healing.  Wounds and bone heal by forming microscopic blood vessels (angiogenesis) and nicotine is a vasoconstrictor (essentially, shrinks blood vessels).  Therefore, if vasoconstriction occurs to these microscopic blood vessels they essentially disappear and are unable to deliver necessary nutrients to the healing tissue.  This is one modifiable factor that you can do to dramatically increase your chances of healing your injury.    (This  means no smoking, no nicotine gum, patches, etc)  DO NOT USE NONSTEROIDAL ANTI-INFLAMMATORY DRUGS (NSAID'S)  Using products such as Advil (ibuprofen), Aleve (naproxen), Motrin (ibuprofen) for additional pain control during fracture healing can delay and/or prevent the healing response.  If you would like to take over the counter (OTC) medication, Tylenol (acetaminophen) is ok.  However, some narcotic medications that are given for pain control contain acetaminophen as well. Therefore, you should not exceed more than 4000 mg of tylenol in a day if you do not have liver disease.  Also note that there are may OTC medicines, such as cold medicines and allergy medicines that my contain tylenol as well.  If you have any questions about medications and/or interactions please ask your doctor/PA or your pharmacist.      ICE AND ELEVATE INJURED/OPERATIVE EXTREMITY  Using ice and elevating the injured extremity above your heart can help with swelling and pain control.  Icing in a pulsatile fashion, such as 20 minutes on and 20 minutes off, can be followed.    Do not place ice directly on skin. Make sure there is a barrier between to skin and the ice pack.    Using frozen items such as frozen peas works well as the conform nicely to the are that needs to be iced.  USE AN ACE WRAP OR TED HOSE FOR SWELLING CONTROL  In addition to icing and elevation, Ace wraps or TED hose are used to help limit and resolve swelling.  It is recommended to use Ace wraps or TED hose until you are informed to stop.    When using Ace Wraps start the wrapping distally (farthest away from the body) and wrap proximally (closer to the body)   Example: If you had surgery on your leg or thing and you do not have a splint on, start the ace wrap at the toes and work your way up to the thigh        If you had surgery on your upper extremity and do not have a splint on, start the ace wrap at your fingers and work your way up to the upper  arm  IF YOU ARE IN A SPLINT OR CAST DO NOT [place]   If your splint gets wet for any reason please contact the office immediately. You may shower in your splint or cast as long as you keep it dry.  This can be done by wrapping in a cast cover or garbage back (or similar)  Do Not stick any thing down your splint or cast such as pencils, money, or hangers to try and scratch yourself with.  If you feel itchy take benadryl as prescribed on the bottle for itching  IF YOU ARE IN A  CAM BOOT (BLACK BOOT)  You may remove boot periodically. Perform daily dressing changes as noted below.  Wash the liner of the boot regularly and wear a sock when wearing the boot. It is recommended that you sleep in the boot until told otherwise  CALL THE OFFICE WITH ANY QUESTIONS OR CONCERTS: [phone]     Discharge Pin Site Instructions  Dress pins daily with Kerlix roll starting on POD 2. Wrap the Kerlix so that it tamps the skin down around the pin-skin interface to prevent/limit motion of the skin relative to the pin.  (Pin-skin motion is the primary cause of pain and infection related to external fixator pin sites).  Remove any crust or coagulum that may obstruct drainage with a saline moistened gauze or soap and water.  After POD 3, if there is no discernable drainage on the pin site dressing, the interval for change can by increased to every other day.  You may shower with the fixator, cleaning all pin sites gently with soap and water.  If you have a surgical wound this needs to be completely dry and without drainage before showering.  The extremity can be lifted by the fixator to facilitate wound care and transfers.  Notify the office/Doctor if you experience increasing drainage, redness, or pain from a pin site, or if you notice purulent (thick, snot-like) drainage.  Discharge Wound Care Instructions  Do NOT apply any ointments, solutions or lotions to pin sites or surgical wounds.   These prevent needed drainage and even though solutions like hydrogen peroxide kill bacteria, they also damage cells lining the pin sites that help fight infection.  Applying lotions or ointments can keep the wounds moist and can cause them to breakdown and open up as well. This can increase the risk for infection. When in doubt call the office.  Surgical incisions should be dressed daily.  If any drainage is noted, use one layer of adaptic, then gauze, Kerlix, and an ace wrap.  Once the incision is completely dry and without drainage, it may be left open to air out.  Showering may begin 36-48 hours later.  Cleaning gently with soap and water.  Traumatic wounds should be dressed daily as well.    One layer of adaptic, gauze, Kerlix, then ace wrap.  The adaptic can be discontinued once the draining has ceased    If you have a wet to dry dressing: wet the gauze with saline the squeeze as much saline out so the gauze is moist (not soaking wet), place moistened gauze over wound, then place a dry gauze over the moist one, followed by Kerlix wrap, then ace wrap.

## 2015-04-07 ENCOUNTER — Inpatient Hospital Stay (HOSPITAL_COMMUNITY): Payer: BLUE CROSS/BLUE SHIELD | Admitting: Occupational Therapy

## 2015-04-07 ENCOUNTER — Inpatient Hospital Stay (HOSPITAL_COMMUNITY): Payer: BLUE CROSS/BLUE SHIELD

## 2015-04-07 LAB — CREATININE, SERUM: Creatinine, Ser: 1.06 mg/dL (ref 0.61–1.24)

## 2015-04-07 MED ORDER — VITAMIN D (ERGOCALCIFEROL) 1.25 MG (50000 UNIT) PO CAPS: 50000.0000 [IU] | ORAL_CAPSULE | ORAL | Status: DC

## 2015-04-07 MED ORDER — TRAMADOL HCL 50 MG PO TABS: 50.0000 mg | ORAL_TABLET | Freq: Four times a day (QID) | ORAL | Status: DC | PRN

## 2015-04-07 MED ORDER — VITAMIN D3 25 MCG (1000 UNIT) PO TABS: 1000.0000 [IU] | ORAL_TABLET | Freq: Two times a day (BID) | ORAL | Status: DC

## 2015-04-07 MED ORDER — OXYCODONE HCL 10 MG PO TABS: 5.0000 mg | ORAL_TABLET | Freq: Four times a day (QID) | ORAL | Status: DC | PRN

## 2015-04-07 MED ORDER — CLOPIDOGREL BISULFATE 75 MG PO TABS
75.0000 mg | ORAL_TABLET | Freq: Every day | ORAL | Status: DC
Start: 2015-04-07 — End: 2015-05-23

## 2015-04-07 MED ORDER — SENNOSIDES-DOCUSATE SODIUM 8.6-50 MG PO TABS: 2.0000 | ORAL_TABLET | Freq: Every day | ORAL | Status: DC

## 2015-04-07 NOTE — Progress Notes (Signed)
[facility] PHYSICAL MEDICINE & REHABILITATION     PROGRESS NOTE    Subjective/Complaints: No new issues. Pain controlled. Feels comfortable with mgt of pin sites.    ROS: Pt denies fever, rash/itching, headache, blurred or double vision, nausea, vomiting, abdominal pain, diarrhea, chest pain, shortness of breath, palpitations, dysuria, dizziness, neck or back pain, bleeding, anxiety, or depression   Objective: Vital Signs: Blood pressure 140/84, pulse 84, temperature 98.6 F (37 C), temperature source Oral, resp. rate 18, height 5\' 9"  (1.753 m), weight 83.915 kg (185 lb), SpO2 96 %. No results found. No results for input(s): WBC, HGB, HCT, PLT in the last 72 hours.  Recent Labs  04/07/15 0526  CREATININE 1.06   CBG (last 3)  No results for input(s): GLUCAP in the last 72 hours.  Wt Readings from Last 3 Encounters:  03/31/15 83.915 kg (185 lb)  03/28/15 85 kg (187 lb 6.3 oz)  02/21/15 85.639 kg (188 lb 12.8 oz)    Physical Exam:  Constitutional: He is oriented to person, place, and time. He appears well-developed and well-nourished.  HENT:  Head: Normocephalic and atraumatic.  Eyes: Conjunctivae are normal. Pupils are equal, round, and reactive to light.  Neck: Normal range of motion. Neck supple.  Cardiovascular: Normal rate and regular rhythm.   Respiratory: Effort normal and breath sounds normal. No respiratory distress. He has no wheezes.  GI: Soft. Bowel sounds are normal. He exhibits no distension. There is no tenderness.  Musculoskeletal:  External fixator on Left ankle with dry dressing. Still some mild swelling about the leg. Left shoulder pain with AROM/PROM--can't lift above 50 degrees abduction  Neurological: He is alert and oriented to person, place, and time.  Speech clear. Able to follow commands without difficulty. Finger to nose intact RUE. Right lower quadrantnopsia improved. RUE and RLE strength grossly . Sensation appears grossly intact. Has  good insight and awareness  Skin: Skin is warm and dry. Pin sites clean Psychiatric: He has a normal mood and affect. His behavior is normal. Judgment and thought content normal  Assessment/Plan: 1. Functional deficits secondary to embolic bilateral CVA, left tib-fib fx with ex-fix, left shoulder dislocation which require 3+ hours per day of interdisciplinary therapy in a comprehensive inpatient rehab setting. Physiatrist is providing close team supervision and 24 hour management of active medical problems listed below. Physiatrist and rehab team continue to assess barriers to discharge/monitor patient progress toward functional and medical goals.  Pt can go home today from my standpoint.  FIM: FIM - Bathing Bathing Steps Patient Completed: Chest, Right Arm, Left Arm, Abdomen, Front perineal area, Buttocks, Right upper leg, Left upper leg, Right lower leg (including foot), Left lower leg (including foot) Bathing: 6: More than reasonable amount of time  FIM - Upper Body Dressing/Undressing Upper body dressing/undressing steps patient completed: Pull shirt around back of front closure shirt/dress, Thread/unthread left sleeve of front closure shirt/dress, Thread/unthread right sleeve of front closure shirt/dress Upper body dressing/undressing: 6: More than reasonable amount of time FIM - Lower Body Dressing/Undressing Lower body dressing/undressing steps patient completed: Thread/unthread right underwear leg, Thread/unthread left underwear leg, Don/Doff right sock, Don/Doff right shoe Lower body dressing/undressing: 7: Complete Independence: No helper  FIM - Toileting Toileting steps completed by patient: Adjust clothing prior to toileting, Performs perineal hygiene, Adjust clothing after toileting Toileting Assistive Devices: Grab bar or rail for support Toileting: 5: Supervision: Safety issues/verbal cues  FIM - Radio producer Devices: Grab bars,  Insurance account manager Transfers: 5-To  toilet/BSC: Supervision (verbal cues/safety issues), 5-From toilet/BSC: Supervision (verbal cues/safety issues)  FIM - Control and instrumentation engineer Devices: Orthosis Bed/Chair Transfer: 5: Set-up assist to: Apply orthosis/W/C set-up  FIM - Locomotion: Wheelchair Distance: 200 Locomotion: Wheelchair: 5: Travels 50 - 149 ft, turns around, maneuvers to table, bed or toilet, negotiates 3% grade: modified independent FIM - Locomotion: Ambulation Locomotion: Ambulation Assistive Devices: Engineer, agricultural Ambulation/Gait Assistance: 5: Supervision Locomotion: Ambulation: 5: Travels 150 ft or more with supervision/safety issues  Comprehension Comprehension Mode: Auditory Comprehension: 7-Follows complex conversation/direction: With no assist  Expression Expression Mode: Verbal Expression: 7-Expresses complex ideas: With no assist  Social Interaction Social Interaction Mode: Asleep Social Interaction: 7-Interacts appropriately with others - No medications needed.  Problem Solving Problem Solving Mode: Asleep Problem Solving: 7-Solves complex problems: Recognizes & self-corrects  Memory Memory: 7-Complete Independence: No helper    Medical Problem List and Plan: 1. Functional deficits secondary to embolic bilateral CVA's, left tib-fib fx, and left shoulder dislocation 2. DVT Prophylaxis/Anticoagulation: Pharmaceutical: Lovenox through 6/23? 3. Pain Management: Continue oxycodone prn with robaxin as needed for muscle spasms.  4. Mood: LCSW to follow for evaluation and support.  5. Neuropsych: This patient is capable of making decisions on his own behalf. 6. Skin/Wound Care: continue Routine pin care bid. Sites clean 7. Fluids/Electrolytes/Nutrition: Monitor I/O. Labs reviewed Po intake appears to be good.   -vit d supp per ortho 8. ABLA: Monitor with routine checks.  9. Closed reduction of Left shoulder dislocation: WBAT LUE  with platform walker with gentle L shoulder motion- pendulums, abduction and FF.Unrestricted ROM L elbow, forearm, wrist and hand .  -RTC injury? 10. Open distal Tib/Fib fracture s/p I & d with revision: NWB LLE   -internal fixation on 04/19/15  -pin sites clean, wife/pt educated LOS (Days) 7 A FACE TO FACE EVALUATION WAS PERFORMED  SWARTZ,ZACHARY T 04/07/2015 8:18 AM

## 2015-04-07 NOTE — Progress Notes (Signed)
Physical Therapy Treatment Note &  Discharge Summary  Patient Details  Name: Justin [name] MRN: [id number] Date of Birth: December 08, 1950  Today's Date: 04/07/2015 PT Individual Time: 0800-0900 PT Individual Time Calculation (min): 60 min  Premedicated - reports soreness. Session focused on grad day activities to prepare for d/c tomorrow. Reviewed basic transfers including furniture transfers and transfers with and without AD at mod I level. W/c propulsion on unit and off unit to simulate community mobility including up/down incline at mod I level. Pt able to recall safety recommendations and techniques taught by therapist yesterday when he went outside and off/on elevator. Stair negotiation for functional strengthening and home entry simulation backwards up/down 1 step x 5 reps using PFRW for support at S level. Finished session with review of LE therex for HEP including heel slides, hip abduction/addution, and SLR x 10 reps each x 3 sets for functional strengthening to aid with overall mobility and endurance. Pt made mod I in room with PFRW and w/c for mobility and transfers.   Patient has met 10 of 10 long term goals due to improved activity tolerance, improved balance, increased strength, decreased pain, ability to compensate for deficits and improved awareness.  Patient to discharge at household ambulatory level with PFRW mod I and mod I w/c level. level.   Patient's care wife is independent to provide the necessary supervision and set-up  assistance at discharge.  Reasons goals not met: all goals met at this time.  Recommendation:  Patient will benefit from ongoing skilled PT services in [place] after scheduled surgery on 7/5 to continue to advance safe functional mobility, address ongoing impairments in strength, ROM, balance, endurance, functional mobility, and minimize fall risk.  Equipment: [phone] w/c with basic cushion and elevating legrests; L PFRW  Reasons for discharge: treatment goals met  and discharge from hospital  Patient/family agrees with progress made and goals achieved: Yes  PT Discharge Precautions/Restrictions Precautions Precautions: Shoulder;Fall Type of Shoulder Precautions: s/p anterior shoulder subluxation and reductions x 2, pendulums, gentle ROM L shoulder--abd and FF, may use elbow to hand freely, may use L UE for mobilization and use PFRW Precaution Comments: WBAT L upper extremity with platform - Per [name], PA Required Braces or Orthoses: Other Brace/Splint Other Brace/Splint: External fixator on LLE  Restrictions Weight Bearing Restrictions: Yes LUE Weight Bearing: Weight bearing as tolerated LLE Weight Bearing: Non weight bearing    Cognition Overall Cognitive Status: Within Functional Limits for tasks assessed Safety/Judgment: Appears intact Sensation Sensation Light Touch: Appears Intact Proprioception: Appears Intact Coordination Gross Motor Movements are Fluid and Coordinated: Yes Motor  Motor Motor: Within Functional Limits  Locomotion  Ambulation Ambulation/Gait Assistance: 6: Modified independent (Device/Increase time)  Trunk/Postural Assessment  Cervical Assessment Cervical Assessment: Within Functional Limits Thoracic Assessment Thoracic Assessment: Within Functional Limits Lumbar Assessment Lumbar Assessment: Within Functional Limits Postural Control Postural Control: Within Functional Limits  Balance Balance Balance Assessed: Yes Static Sitting Balance Static Sitting - Level of Assistance: 6: Modified independent (Device/Increase time) Dynamic Sitting Balance Dynamic Sitting - Level of Assistance: 6: Modified independent (Device/Increase time) Static Standing Balance Static Standing - Level of Assistance: 6: Modified independent (Device/Increase time) Dynamic Standing Balance Dynamic Standing - Level of Assistance: 6: Modified independent (Device/Increase time) Extremity Assessment      RLE Assessment RLE  Assessment: Within Functional Limits LLE Assessment LLE Assessment: Exceptions to [facility] LLE Strength LLE Overall Strength Comments: hip and knee 45/; ankle NT due to external fixator  See FIM for current  functional status  [name], PT, DPT  04/07/2015, 11:37 AM

## 2015-04-07 NOTE — Progress Notes (Signed)
Occupational Therapy Session Note  Patient Details  Name: Justin [name] MRN: [id number] Date of Birth: September 19, 1951  Today's Date: 04/07/2015 OT Individual Time: 0900-1025 and 1430-1530 OT Individual Time Calculation (min): 85 min and 60 min   Short Term Goals: Week 1:  OT Short Term Goal 1 (Week 1): STG=LTG due to anticipated short LOS  Skilled Therapeutic Interventions/Progress Updates:    Session One: Pt seen for OT session focusing on functional transfers, UE ROM, functional standing balance, w/c management, and functional activity tolerance. Pt in recliner upon arrival, voicing fatigue from PT session however agreeable to tx. Pt transferred recliner to w/c mod I via squat pivot transfer. He self propelled w/c to therapy day room mod I where he completed towel pushes with B UEs in all planes. Exercises completed 2 sets of 10 reps in each plane. Exercises completed from w/c level where pt forced to come into upright sitting off back of w/cand reach to stretch B UEs. Pt educated regarding exercises for ROM as part of HEP. Pt then taken to therapy gym where he transferred onto mat mod I. Pt completed bean bag toss standing without AD to toss, required to reach to lower L to obtain bean bag. Pt then completed task standing on foam mat with steadying assist, however, pt voiced feeling  Very unsteady on mat and desired to complete remainder of task standing on regular flooring. Pt tolerated ~3-4 minutes of standing before requesting seated rest break. He voiced increased fatigue and pain in L LE following task, pt returned to supine on mat with leg propped on wedge mat. Pt then gathered bean bags standing at [place] using reacher to gather items with supervision.  In ADL apartment, pt competed simulated tub shower and toilet transfers. Pt ambulated into bathroom with sidesteps using PFRW and supervision to compete transfer. Pt completed toilet transfers mod I, discussion of safety awareness regarding  standing vs sitting position for hygiene and clothing management, agreed that sitting was safest position to complete task.    Pt returned to room at end of session, and completed grooming task standing at the sink with PFRW to compete oral care. Pt returned to recliner at end of session, all needs in reach.  Pt educated regarding home safety modifications, energy conservation, and d/c planning.   Session Two: Pt seen for ADL bathing and dressing session. Pt sitting up in recliner upon arrival, agreeable to tx. Pt ambulated into bathroom with PFRW mod I. Pt performed bathing mod I, competing lateral leans to complete buttock hygiene. Pt dressed seated on shower chair mod I. Pt returned to recliner, where wife performed bandage wrapping. Pt and wife educated regarding DME that was brought to room for d/c. Information provided regarding set-up for tub transfer bench. Pt's new PFRW adjusted for proper height. Pt left in recliner at end of session, all needs in reach.  Education provided regarding planning of day/ prioritizing, home set-up, and DME set-up and recommendations.  Therapy Documentation Precautions:  Precautions Precautions: Shoulder, Fall Type of Shoulder Precautions: s/p anterior shoulder subluxation and reductions x 2, pendulums, gentle ROM L shoulder--abd and FF, may use elbow to hand freely, may use L UE for mobilization and use PFRW Shoulder Interventions: Shoulder sling/immobilizer Precaution Booklet Issued: No Precaution Comments: WBAT L upper extremity with platform - Per [name], PA Required Braces or Orthoses: Other Brace/Splint Other Brace/Splint: External fixator on LLE  Restrictions Weight Bearing Restrictions: Yes LUE Weight Bearing: Weight bearing as tolerated LLE Weight Bearing:  Non weight bearing Pain: Pain Assessment Pain Assessment: 0-10 Pain Score: 4  Pain Location: Leg Pain Orientation: Left Pain Descriptors / Indicators: Aching Pain Intervention(s):  Repositioned;Ambulation/increased activity ADL: ADL ADL Comments: see FIM  See FIM for current functional status  Therapy/Group: Individual Therapy  Lewis, Azam Gervasi C 04/07/2015, 7:17 AM

## 2015-04-07 NOTE — Progress Notes (Signed)
Occupational Therapy Discharge Summary  Patient Details  Name: Justin [name] MRN: [id number] Date of Birth: 1950/12/06   Patient has met 10 of 10 long term goals due to improved activity tolerance, improved balance, postural control, ability to compensate for deficits and functional use of  LEFT upper extremity.  Patient to discharge at overall Modified Independent level. Recommending pt to have supervision to compete shower transfers. Patient's care partner is independent to provide the necessary physical assistance at discharge.     Recommendation:  Patient will benefit from ongoing skilled OT services following surgery on 04/19/15. Pt will benefit from OPOT in order to address limited L shoulder ROM. HEP provided for ROM exercises to complete until OPOT services have begun.  Equipment: Tub transfer bench  Reasons for discharge: treatment goals met and discharge from hospital  Patient/family agrees with progress made and goals achieved: Yes  OT Discharge Precautions/Restrictions  Precautions Precautions: Shoulder;Fall Type of Shoulder Precautions: s/p anterior shoulder subluxation and reductions x 2, pendulums, gentle ROM L shoulder--abd and FF, may use elbow to hand freely, may use L UE for mobilization and use PFRW Precaution Comments: WBAT L upper extremity with platform - Per [name], PA Required Braces or Orthoses: Other Brace/Splint Other Brace/Splint: External fixator on LLE  Restrictions Weight Bearing Restrictions: Yes LUE Weight Bearing: Weight bearing as tolerated LLE Weight Bearing: Non weight bearing ADL ADL ADL Comments: see FIM Vision/Perception  Vision- History Baseline Vision/History: Wears glasses Wears Glasses: At all times Patient Visual Report: Peripheral vision impairment;Other (comment) Vision- Assessment Vision Assessment?: Vision impaired- to be further tested in functional context Additional Comments: Pt reports blurry vision in R lower  quadrant  Cognition Overall Cognitive Status: Within Functional Limits for tasks assessed Arousal/Alertness: Awake/alert Orientation Level: Oriented X4 Focused Attention: Appears intact Sustained Attention: Appears intact Memory: Appears intact Awareness: Appears intact Problem Solving: Appears intact Safety/Judgment: Appears intact Sensation Sensation Light Touch: Appears Intact Coordination Gross Motor Movements are Fluid and Coordinated: Yes Fine Motor Movements are Fluid and Coordinated: Yes Motor  Motor Motor: Within Functional Limits Mobility  Transfers Transfers: Sit to Stand;Stand to Sit Sit to Stand: 6: Modified independent (Device/Increase time) Stand to Sit: 6: Modified independent (Device/Increase time)  Trunk/Postural Assessment  Cervical Assessment Cervical Assessment: Within Functional Limits Thoracic Assessment Thoracic Assessment: Within Functional Limits Lumbar Assessment Lumbar Assessment: Within Functional Limits Postural Control Postural Control: Within Functional Limits  Balance Balance Balance Assessed: Yes Static Sitting Balance Static Sitting - Balance Support: Feet supported Static Sitting - Level of Assistance: 6: Modified independent (Device/Increase time) Dynamic Sitting Balance Dynamic Sitting - Balance Support: During functional activity Dynamic Sitting - Level of Assistance: 6: Modified independent (Device/Increase time) Dynamic Sitting - Balance Activities: Lateral lean/weight shifting;Forward lean/weight shifting;Reaching across midline;Reaching for weighted objects Sitting balance - Comments: Sitting to complete bathing task  Static Standing Balance Static Standing - Level of Assistance: 6: Modified independent (Device/Increase time) Static Standing - Comment/# of Minutes: Standing with PFRW to complete grooming task at sink Dynamic Standing Balance Dynamic Standing - Balance Support: During functional activity Dynamic Standing -  Level of Assistance: 6: Modified independent (Device/Increase time) Dynamic Standing - Balance Activities: Lateral lean/weight shifting;Forward lean/weight shifting;Reaching across midline;Reaching for weighted objects Dynamic Standing - Comments: Standing with PFRW to complete grooming task at sink Extremity/Trunk Assessment RUE Assessment RUE Assessment: Within Functional Limits (grip strength slightly less than L, however, WFL) LUE Assessment LUE Assessment: Exceptions to [facility] LUE AROM (degrees) Overall AROM Left Upper Extremity: Deficits;Due to  precautions LUE Overall AROM Comments: Limited AROM at shoulder due to precautions s/p dislocation; elbow & grip wfl LUE Strength LUE Overall Strength: Deficits;Due to precautions LUE Overall Strength Comments: shoulder flexion/abduction grossly 2+/5, elbow grossly 4/5 (make test), grip 5/5  See FIM for current functional status  Lewis, Eulla Kochanowski C 04/07/2015, 12:36 PM

## 2015-04-07 NOTE — Progress Notes (Signed)
Social Work Patient ID: Justin [name], male   DOB: 08-05-1951, 64 y.o.   MRN: [id number] Pam-PA reports pt going home today versus tomorrow.  Pt and wife pleased with this plan. Have informed [facility] for delivery of equipment and contacted [facility].

## 2015-04-07 NOTE — Discharge Summary (Signed)
Physician Discharge Summary  Patient ID: Justin [name] MRN: [id number] DOB/AGE: 62/18/1952 62 y.o.  Admit date: 03/31/2015 Discharge date: 04/07/2015  Discharge Diagnoses:  Principal Problem:   Embolic cerebral infarction Active Problems:   Open fracture of left tibia and fibula   Dislocation of left shoulder joint   Discharged Condition: Stable   Labs:  Basic Metabolic Panel: BMP Latest Ref Rng 04/07/2015 03/29/2015 03/24/2015  Glucose 65 - 99 mg/dL - 113(H) -  BUN 6 - 20 mg/dL - 15 -  Creatinine 0.61 - 1.24 mg/dL 1.06 1.01 -  Sodium 135 - 145 mmol/L - 137 137  Potassium 3.5 - 5.1 mmol/L - 3.8 3.9  Chloride 101 - 111 mmol/L - 106 -  CO2 22 - 32 mmol/L - 22 -  Calcium 8.9 - 10.3 mg/dL - 9.0 -     CBC: CBC Latest Ref Rng 03/29/2015 03/28/2015 03/27/2015  WBC 4.0 - 10.5 K/uL 8.0 7.3 6.5  Hemoglobin 13.0 - 17.0 g/dL 11.1(L) 10.0(L) 9.9(L)  Hematocrit 39.0 - 52.0 % 33.5(L) 30.0(L) 29.9(L)  Platelets 150 - 400 K/uL 292 235 207     CBG: No results for input(s): GLUCAP in the last 168 hours.  Brief HPI:   Justin [name] is a 64 y.o. male with history of TIA, Left parietal infarct 04/2013 s/p loop recorder implantation, PFO, Bell's palsy; who fell off a ladder on 03/22/15 with subsequent left grade 3A open left pilon fibula/ tibia fracture and left anterior shoulder dislocation.  He was taken to OR emergently for left shoulder close reduction as well as I & D left distal Tib-Fib with placement of external fixator. Dr. Marcelino Scot consulted for input and patient taken to OR on 06/09 for I & D with ORIF left pilon, tibia and Fibula as well as CR of left shoulder redislocation. To be NWB LUE/LLE. Post op, patient noted to be lethargic with delayed responses as well as visual deficits. Stat CT head done revealing acute left PCA territory infarct. MRI/MRA brain done revealing acute medial left occipital lobe infarct with additional smaller cortical infarcts bilateral occipital lobes and  scattered cortical/white matter infarcts in watershed distribution bilaterally and Dr. Leonie Man recommended changing ASA to plavix for secondary stroke prevention of Embolic CVA of questionable etiology as his Loop recorder showed no evidence of A Fib.  Visual changes improving and lethargy has resolved.  External fixator to stay in place LLE till surgery in the next 2 weeks. Therapy ongoing and CIR was recommended for follow up therapy.    Hospital Course: Justin [name] was admitted to rehab 03/31/2015 for inpatient therapies to consist of PT and OT at least three hours five days a week. Past admission physiatrist, therapy team and rehab RN have worked together to provide customized collaborative inpatient rehab. Blood pressures have been well controlled and activity tolerance has greatly improved.  Follow up CBC showed ABLA to be resolving and renal status is stable. He is able to tolerate weightbearing  thorough LUE and pain in LLE is improving with prn oxycodone use.  Po intake has been good and he is continent of bowel and bladder.  He has had some serous drainage from pin sites that has resolved. Left foot incision is clean, dry and intact. He has made steady progress during his rehab stay with increase in confidence and is currently independent at wheelchair level in a supervised setting. He will continue to receive follow up [facility] for wound care by [facility]. He has been  set up for    Rehab course: During patient's stay in rehab weekly team conferences were held to monitor patient's progress, set goals and discuss barriers to discharge. At admission, patient required min assist with basic self care tasks and mobility. Speech therapy evaluation revealed cognition to be at baseline therefore not needed. He has had improvement in activity tolerance, balance, postural control, as well as ability to compensate for deficits.  He is able to complete bathing and dressing tasks independently. He requires  supervision with toileting. He is independent for transfers. He is able to ambulate household distance with PFRW and supervision.     Disposition: Home.   Diet: Heart Healthy.   Special Instructions: 1. No weight on Left leg. 2. Cleanse pin sites with soap and water. Pat dry and apply dry padded dressing.      Medication List    STOP taking these medications        acyclovir 800 MG tablet  Commonly known as:  ZOVIRAX     aspirin 325 MG tablet     predniSONE 10 MG tablet  Commonly known as:  DELTASONE     zoster vaccine live (PF) 19400 UNT/0.65ML injection  Commonly known as:  ZOSTAVAX      TAKE these medications        cholecalciferol 1000 UNITS tablet  Commonly known as:  VITAMIN D  Take 1 tablet (1,000 Units total) by mouth 2 (two) times daily.     clopidogrel 75 MG tablet  Commonly known as:  PLAVIX  Take 1 tablet (75 mg total) by mouth daily.     Oxycodone HCl 10 MG Tabs--45 pills   Take 0.5-1 tablets (5-10 mg total) by mouth every 6 (six) hours as needed for severe pain or breakthrough pain.     senna-docusate 8.6-50 MG per tablet  Commonly known as:  Senokot-S  Take 2 tablets by mouth at bedtime.     simvastatin 10 MG tablet  Commonly known as:  ZOCOR  Take 10 mg by mouth daily.     traMADol 50 MG tablet--45 pills  Commonly known as:  ULTRAM  Take 1 tablet (50 mg total) by mouth every 6 (six) hours as needed for moderate pain.     Vitamin D (Ergocalciferol) 50000 UNITS Caps capsule  Commonly known as:  DRISDOL  Take 1 capsule (50,000 Units total) by mouth every 7 (seven) days.           Follow-up Information    Follow up with [name], MD On 06/13/2015.   Specialty:  Physical Medicine and Rehabilitation   Why:  Be there at 10 am  for 10:20  appointment   Contact information:   [address], [place] [address] [phone]       Follow up with Rozanna Box, MD On 04/18/2015.   Specialty:  Orthopedic Surgery   Why:   only if problems arise, otherwise surgery on 04/19/2015   Contact information:   [address] [place] [address] [phone]       Follow up with SETHI,PRAMOD, MD. Call today.   Specialties:  Neurology, Radiology   Why:  for follow up appointment   Contact information:   [address] [place]  [address] [phone]       Signed: Bary Leriche 04/07/2015, 3:42 PM

## 2015-04-07 NOTE — Progress Notes (Signed)
Social Work Discharge Note Discharge Note  The overall goal for the admission was met for:   Discharge location: Yes-HOME WITH WIFE WHO CAN PROVIDE 24 HR CARE  Length of Stay: Yes-8 DAYS  Discharge activity level: Yes-SUPERVISION LEVEL  Home/community participation: Yes  Services provided included: MD, RD, PT, OT, SLP, RN, CM, Pharmacy and SW  Financial Services: Other: [employer]  Follow-up services arranged: Home Health: [facility] -RN UNTIL AFTER SURGERY, DME: [facility], [place] and Patient/Family has no preference for HH/DME agencies  Comments (or additional information):PT DID WELL, SURGERY NOT UNTIL WEEK OF 7/3,WIFE COMFORTABLE WITH PT'S CARE AND READY TO [place]  Patient/Family verbalized understanding of follow-up arrangements: Yes  Individual responsible for coordination of the follow-up plan: SELF & MARCIA-WIFE  Confirmed correct DME delivered: Elease Hashimoto 04/07/2015    Elease Hashimoto

## 2015-04-07 NOTE — Progress Notes (Signed)
Pt. Got d/c instructions and prescriptions pt. Ready to go home with his wife.

## 2015-04-08 ENCOUNTER — Ambulatory Visit (INDEPENDENT_AMBULATORY_CARE_PROVIDER_SITE_OTHER): Payer: BLUE CROSS/BLUE SHIELD | Admitting: *Deleted

## 2015-04-08 ENCOUNTER — Encounter: Payer: Self-pay | Admitting: Internal Medicine

## 2015-04-08 DIAGNOSIS — I639 Cerebral infarction, unspecified: Secondary | ICD-10-CM | POA: Diagnosis not present

## 2015-04-10 ENCOUNTER — Encounter: Payer: Self-pay | Admitting: Internal Medicine

## 2015-04-12 LAB — CUP PACEART REMOTE DEVICE CHECK: Date Time Interrogation Session: 20160625040500

## 2015-04-13 NOTE — Progress Notes (Signed)
Loop recorder 

## 2015-04-20 ENCOUNTER — Telehealth: Payer: Self-pay | Admitting: Neurology

## 2015-04-20 ENCOUNTER — Encounter (HOSPITAL_COMMUNITY): Payer: Self-pay | Admitting: Vascular Surgery

## 2015-04-20 ENCOUNTER — Encounter (HOSPITAL_COMMUNITY): Payer: Self-pay | Admitting: *Deleted

## 2015-04-20 NOTE — H&P (View-Only) (Signed)
Orthopaedic Trauma Service Consult Note  Requesting: B. Swinteck, MD (ortho) Reason: Open L distal tib-fib fracture, L shoulder dislocation   PCP: Dr. Cushing Cards: Dr. Klein  HPI  Very pleasant 64-year-old right-hand-dominant white male on his barn yesterday evening changing some lites when he fell off of his ladder. Patient does not recall the exact way that he fell however he believes that his left leg got caught between the rungs and he fell on his left shoulder. Patient had immediate onset of pain. He attempted to get up but sought deformity at his left ankle. He crawled approximately 100 yards back to his house to call 911. He was brought to [facility] for evaluation. He was found to have an open left distal tibia fracture as well as a left shoulder dislocation. Patient was seen and evaluated by Dr. Swinteck in the emergency department and was taken urgently to the OR for closed reduction of his left shoulder as well as irrigation and debridement of his open left distal tibia fracture with application of a spanning external fixator. Given the complexity of the patient's injury it was felt that the patient would need the expertise of a fellowship trained orthopedic traumatologist for care of his injuries. As such we are consulted for management of patient's injuries.  Patient is seen on 5 N. complains only of left shoulder and left ankle pain. He does have some very mild paresthesias to the dorsum of his left foot. Soreness left shoulder. Denies any injuries to the right side of his body. Does not recall losing consciousness prior to during or after his fall. Denies any chest pain or palpitations. No abdominal pain.   Patient does have a implanted loop recorder which was placed after patient sustained a stroke approximately 2 years ago without an identifiable cause. Patient sees Dr. Klein for this.+ PFO on bubble study back in 2014. No cardiac issues since his stroke in 2014.    Patient has  been on Ancef, penicillin and gentamicin for his fracture since admission.   Case and management discussed with Dr. handy   Review of Systems  Constitutional: Negative for fever and chills.  Eyes: Negative for blurred vision.  Respiratory: Negative for shortness of breath and wheezing.   Cardiovascular: Negative for chest pain and palpitations.  Gastrointestinal: Negative for nausea, vomiting and abdominal pain.  Genitourinary: Negative for dysuria and urgency.  Musculoskeletal:       Left ankle and left shoulder pain  Neurological: Positive for tingling and sensory change. Negative for headaches.       Left foot    Past Medical History  Diagnosis Date  . Bell's palsy 1980's; 1990's; 2000's; 12/2012; 03/2013    "multiple times" (04/21/2013)  . TIA (transient ischemic attack) 04/21/2013  . Stroke   . Hypercholesteremia     Past Surgical History  Procedure Laterality Date  . Tee without cardioversion N/A 04/24/2013    Procedure: TRANSESOPHAGEAL ECHOCARDIOGRAM (TEE);  Surgeon: Philip J Nahser, MD;  Location: [facility] ENDOSCOPY;  Service: Cardiovascular;  Laterality: N/A;  . Implanted loop recorder    . Colonoscopy N/A 07/13/2013    Procedure: COLONOSCOPY;  Surgeon: Sandi L Fields, MD;  Location: [facility];  Service: Endoscopy;  Laterality: N/A;  8:30 AM  . Loop recorder implant N/A 04/24/2013    Procedure: LOOP RECORDER IMPLANT;  Surgeon: Steven C Klein, MD;  Location: [facility] CATH LAB;  Service: Cardiovascular;  Laterality: N/A;   Allergies No Known Allergies   Medications Prior to   Admission  Medication Sig Dispense Refill  . aspirin 325 MG tablet Take 325 mg by mouth daily.    . simvastatin (ZOCOR) 10 MG tablet Take 10 mg by mouth daily.    . acyclovir (ZOVIRAX) 800 MG tablet Take 1 tablet (800 mg total) by mouth 4 (four) times daily. (Patient not taking: Reported on 03/22/2015) 20 tablet 1  . aspirin 325 MG tablet Take 1 tablet (325 mg total) by mouth daily. (Patient not taking: Reported  on 03/22/2015) 30 tablet 1  . predniSONE (DELTASONE) 10 MG tablet 6 tabs on day 1, 5 tabs on day 2, 4 tabs on day 3, 3 tabs on day 4, 2 tabs on day 5, and 1 tab on day 6. (Patient not taking: Reported on 03/22/2015) 21 tablet 0  . simvastatin (ZOCOR) 10 MG tablet Take 1 tablet (10 mg total) by mouth at bedtime. (Patient not taking: Reported on 03/22/2015) 90 tablet 3  . zoster vaccine live, PF, (ZOSTAVAX) 19400 UNT/0.65ML injection Inject 19,400 Units into the skin once. (Patient not taking: Reported on 02/21/2015) 1 each 0     Family History  Problem Relation Age of Onset  . Heart disease Mother   . Alcohol abuse Mother   . Colon cancer Neg Hx    History   Social History  . Marital Status: Married    Spouse Name: N/A  . Number of Children: 3  . Years of Education: college   Occupational History  . [employer]    Social History Main Topics  . Smoking status: Never Smoker   . Smokeless tobacco: Never Used  . Alcohol Use: Yes     Comment: 03/22/2013 "glass of wine couple times/yr"  . Drug Use: No  . Sexual Activity: Yes   Other Topics Concern  . Not on file   Social History Narrative      Objective   BP 126/89 mmHg  Pulse 95  Temp(Src) 97.7 F (36.5 C) (Oral)  Resp 20  Ht 5' 9" (1.753 m)  Wt 83.915 kg (185 lb)  BMI 27.31 kg/m2  SpO2 97%  Intake/Output      06/07 0701 - 06/08 0700 06/08 0701 - 06/09 0700   I.V. (mL/kg) 1550 (18.5)    IV Piggyback 100    Total Intake(mL/kg) 1650 (19.7)    Urine (mL/kg/hr) 450    Stool 0    Blood 100    Total Output 550     Net +1100            Labs Results for [name], [name] (MRN [id number]) as of 03/23/2015 09:19  Ref. Range 03/23/2015 05:09  Sodium Latest Ref Range: 135-145 mmol/L 141  Potassium Latest Ref Range: 3.5-5.1 mmol/L 4.1  Chloride Latest Ref Range: 101-111 mmol/L 105  CO2 Latest Ref Range: 22-32 mmol/L 26  BUN Latest Ref Range: 6-20 mg/dL 15  Creatinine Latest Ref Range: 0.61-1.24 mg/dL 1.12  Calcium Latest Ref  Range: 8.9-10.3 mg/dL 8.7 (L)  EGFR (Non-African Amer.) Latest Ref Range: >60 mL/min >60  EGFR (African American) Latest Ref Range: >60 mL/min >60  Glucose Latest Ref Range: 65-99 mg/dL 162 (H)  Anion gap Latest Ref Range: 5-15  10   Results for [name], [name] (MRN [id number]) as of 03/23/2015 09:19  Ref. Range 03/22/2015 20:36 03/22/2015 21:22 03/22/2015 23:00 03/23/2015 00:00 03/23/2015 05:09  Glucose Latest Ref Range: 65-99 mg/dL 171 (H)    162 (H)    Exam  Gen: Awake and alert, no acute distress,   resting comfortably in bed Lungs: Clear to auscultation bilaterally Cardiac: Regular rate and rhythm, S1 and S2 Abd: Soft, nontender, nondistended,+ bowel sounds Pelvis: no traumatic wounds or rash, no ecchymosis, stable to manual stress, nontender Ext:       Left upper extremity Inspection:   Patient in sling   No gross deformities noted in the shoulder, elbow, forearm, wrist or hand   No open wounds or lesions   No ecchymosis Bony eval:   Tender to palpation left shoulder    Clavicle, elbow, forearm, wrist and hand are nontender    no crepitus with evaluation Soft tissue:    Soft tissues unremarkable.    Soft tissue structures are on shoulder not stressed in terms of stability ROM:    Full elbow, forearm, wrist and hand motion are noted Sensation:    Radial, ulnar, median, axillary nerve sensory function intact Motor:    Radial, ulnar, median, axillary nerves motor functions intact Vascular:    Extremity is warm    Palpable radial pulse    No Significant swelling to the left upper extremity       Left lower extremity Inspection:   Patient in a delta frame external fixator   2 tibial half pins, one trans-calcaneal pin   Delta frame is augmented with a bar between the 2 peripheral bars   Dressing to the left lower leg is stable some mild oozing noted   Hip and knee are unremarkable Bony eval:    Hip and knee are nontender, no crepitus with evaluation    Distal tibia and fibular  are tender Soft tissue:    Soft Tissue around the knee and hip are unremarkable     I did not remove the dressing to evaluate his traumatic wounds as the patient was just in the OR several hours ago ROM:    Unable to assess ankle range of motion as the patient is in a spanning external fixator Sensation:    DPN, SPN and TN sensory functions are grossly intact. Some slight decrease along the DPN distribution Motor:    EHL, FHL and lesser toe motor functions are intact Vascular:   Extremity is warm   Palpable dorsalis pedis pulse   No significant swelling noted at this time   Compartments are soft and nontender, no pain out of proportion with passive stretching       Right upper extremity UEx shoulder, elbow, wrist, digits- no skin wounds, nontender, no instability, no blocks to motion  Sens  Ax/R/M/U intact  Mot   Ax/ R/ PIN/ M/ AIN/ U intact  Rad 2+       Right lower extremity            No traumatic wounds, ecchymosis, or rash  Nontender  No effusions  Knee stable to varus/ valgus and anterior/posterior stress   Hip is stable, no pain with axial loading or logrolling  Ankle is unremarkable as well. Ankle is stable with ligamentous stressing. Full range of motion is noted  Sens DPN, SPN, TN intact  Motor EHL, ext, flex, evers 5/5  DP 2+, No swelling/edema    Assessment and Plan   POD/HD#: 1  1. Grade 3 open left distal tib-fib fracture status post I&D and external fixation, closed left anterior shoulder dislocation status post reduction   Return to the OR tomorrow for repeat irrigation and debridement open left tibia injury and external fixator revision, may consider placing a flexible nail and fibula tomorrow  CT scan   pending left ankle  Would anticipate delayed fixation about 10-14 days given the magnitude of wound  Also due to the size of his wound we did discuss the possibility of early evaluation by plastic surgery given the tenuous nature of medial sided soft  tissue.  Injury films show that there is very little articular block which may make plate fixation somewhat difficult however we will again review the CT scan. It may be that patient is treated definitively in his ex-fix  Patient does remain at risk for complications such as nonunion and deep infection given the open nature of his injury.  Patient will remain on IV antibodies for now. He will remain on them until we definitively close his wound for 72 hours post definitive closure   Patient does not smoke and does not report a history of diabetes which is a favorable history and the presence of this open injury.  On review of his labs his glucose has been somewhat elevated in the 170s and 160s. Will check a hemoglobin A1c to ensure that he is not an undiagnosed diabetic. This may simply be a stress reaction due to his trauma   With respect to his left upper extremity we will proceed with MRI at this time to evaluate for any ligamentous or other structural injury. It would be optimal for the patient to be able to use his arm to weight-bear through given his left lower extremity injury.  Continue with sling for now   Ice and elevation to extremities  Elevate left leg above heart for swelling/edema control  2. Pain management:  Continue with current management  3. ABL anemia/Hemodynamics  CBC in a.m.  4. Medical issues   Stable  + PFO on bubble study back in 2014. No cardiac issues since his stroke in 2014. No identifiable cause of the stroke noted   5. DVT/PE prophylaxis:  We'll place on Lovenox after next surgery  6. ID:   Continue with Ancef, gentamicin and penicillin until further notice  7. Metabolic Bone Disease:  Check vitamin D levels  8. Activity:  Activity as tolerated  Non-Weightbearing left upper extremity and left lower extremity  Okay to transfer from bed to chair with assist  9. FEN/Foley/Lines:  Advance to regular diet  Npo after midnight  10.Ex-fix/Splint  care:  Okay to manipulate left leg by fixator  11. Dispo:  Return to the OR tomorrow for repeat irrigation and debridement of left distal tibia and fibula fractures    Shomari Matusik W. Laronica Bhagat, PA-C [facility] [phone] ([phone] (O) 03/23/2015 9:12 AM    Patient will remain on IV antibodies for now. He will remain on them until we definitively close his wound for 72 hours post definitive closure              Patient does not smoke and does not report a history of diabetes which is a favorable history and the presence of this open injury.             On review of his labs his glucose has been somewhat elevated in the 170s and 160s. Will check a hemoglobin A1c to ensure that he is not an undiagnosed diabetic. This may simply be a stress reaction due to his trauma              With respect to his left upper extremity we will proceed with MRI at this time to evaluate for any ligamentous or other structural injury. It would be optimal for the patient to be able to use his arm to weight-bear through given his left lower extremity injury.             Continue with sling for now              Ice and elevation to extremities             Elevate left leg above heart for swelling/edema control  2. Pain management:             Continue with current management  3. ABL anemia/Hemodynamics             CBC in a.m.  4. Medical issues                Stable             + PFO on bubble study back in 2014. No cardiac issues since his stroke in 2014. No identifiable cause of the stroke noted   5. DVT/PE prophylaxis:             We'll place on Lovenox after next surgery  6. ID:               Continue with Ancef, gentamicin and penicillin until further notice  7. Metabolic Bone Disease:             Check vitamin D levels  8. Activity:             Activity as tolerated             Non-Weightbearing left upper extremity and left lower extremity             Okay to transfer from bed to chair with assist  9. FEN/Foley/Lines:             Advance to regular diet             Npo after midnight  10.Ex-fix/Splint care:             Okay to manipulate left leg by fixator  11. Dispo:             Return to the OR tomorrow for repeat irrigation and debridement of left distal tibia and fibula fractures    [name], PA-C [facility] [phone] (O) 03/23/2015 9:12 AM

## 2015-04-20 NOTE — Progress Notes (Signed)
Anesthesia Chart Review: SAME DAY WORK-UP.  Justin [name] is a 64 year old male scheduled for ORIF left distal tibia fracture tomorrow by Dr. Marcelino Scot. He fell off of a ladder in his barn on 03/24/15.   History includes non-smoker, hypercholesterolemia, left cortical parietal infarct 04/21/13 s/p loop recorder, PFO by 04/2013 TEE, Bell's palsy. closed reduction left shoulder dislocation, placement LLE external fixator with I&D left open pilon fracture 03/22/15 and ORIF of left pilon fracture, tib-fib, I&D left open fracture, I&D external fixation revision left ankle 03/24/15. During the evening of 03/24/15 he awoke complaining of visual difficulty with right visual filed cut and right facial weakness (later found not to be a new finding). STAT head CT showed acute left posterior cerebral artery territory infarct. Acute infarct confirmed by MRI.   Last neurology note by Dr. Antony Contras on 03/27/15 stated: "I had a long discussion with the Justin [name] and his wife regarding now his second embolic stroke which is of cryptogenic etiology and atrial fibrillation has not been found on his loop recorder. He does have a PFO and I discussed the recent FDA panel decision for the Amplatzer PFO closure device showing overall risk-benefit is in favor of using the device for closure. There is still no formal FDA approval for the device but perhaps the Justin [name] may qualify for compassionate use. We will like him to heal from his current fractures and may consider this electively after 2-3 months. He does remain at risk for recurrent strokes and TIAs and needs ongoing aggressive risk factor control. change aspirin to Plavix for secondary stroke prevention. Follow up as outpatient in 2 months and will refer to cardiology for PFO closure electively. Stroke service will sign off. Kindly call for questions."  Meds include Plavix, oxycodone, Zocor, tramadol. Per Justin [name], Dr. Marcelino Scot instructed Justin [name] to remain on Plavix due to his recent CVA.    03/25/15 EKG: ST at 138 bpm, non-specific ST/T wave abnormality.   03/25/15 Echo:  Study Conclusions - Left ventricle: The cavity size was normal. Wall thickness was increased in a pattern of mild LVH. The estimated ejection fraction was 65%. Wall motion was normal; there were no regional wall motion abnormalities. - Right ventricle: The cavity size was mildly decreased. Systolic function was mildly reduced. - Pulmonary arteries: PA peak pressure: 43 mm Hg (S). - Pericardium, extracardiac: A trivial pericardial effusion was identified posterior to the heart. - Impressions: No cardiac source of embolism was identified, but cannot be ruled out on the basis of this examination. (TEE 04/24/2013 showed PFO.)   Loop recorder was placed by EP cardiologist Dr. Caryl Comes, last visit 02/21/15.  No interval afib noted during interrogation at that time. Loop recorder re-interrogated during his 03/2015 admission following his CVA. There was no afib/flutter. Multiple episode of tachycardia, all appeared sinus.   03/25/15 Carotid duplex:  Summary: - The vertebral arteries appear patent with antegrade flow. - Findings consistent with 1-39 percent stenosis involving the right internal carotid artery and the left internal carotid artery.  03/25/15 BLE venous doppler: No DVT.  03/25/15 Brain MRI: IMPRESSION: 1. Acute medial left occipital lobe infarct with minimal blood products along the anterior periphery of the infarct. 2. Additional scattered infarcts are present over the watershed distributions bilaterally. This may be secondary to the watershed type infarct first is multiple emboli. 3. A left parietal infarct focus demonstrates additional blood products. 4. Minimal ethmoid sinus disease. 5. Normal MRA circle of Willis without evidence for significant proximal stenosis, aneurysm, or branch vessel  occlusion.  He is for labs on arrival.  LFTs were elevated last month.  CMET is included in his  pre-operative labs.   I called and spoke with Dr. Marcelino Scot this afternoon (~ 3 PM) to see if he had spoken with neurology about Justin [name]'s upcoming surgery since he had a recent CVA < 90 days ago and will ultimately need referral for consideration of PFO closure. Dr. Marcelino Scot was planning to keep Justin [name] on Plavix but will contact neurology today for pre-operative recommendations (delay surgery versus proceed with known increased risk for recurrent CVA but hopefully further decreased risk since he is remaining on Plavix).  I asked that his office give me an update once he has heard back from neurology.  I also updated anesthesiologist Dr. Orene Desanctis. If I don't hear back from Dr. Marcelino Scot before I leave the office this evening then he will have to update Justin [name]'s anesthesiologist tomorrow morning.  Samarion Hugh [facility] Short Stay Center/Anesthesiology Phone [phone] 04/20/2015 5:26 PM

## 2015-04-20 NOTE — Progress Notes (Signed)
Spoke with pt's wife for pre-op call, per pt request. Pt had a stroke after 2nd surgery in June, pt's wife states he still has some peripheral vision issues and was told some memory issues. No new symptoms noted since. Pt was instructed to stay on his Plavix by Dr. Marcelino Scot due to the fact that he recently had the stroke.

## 2015-04-20 NOTE — Progress Notes (Signed)
Called Dr. Carlean Jews office to request pre-op orders, spoke with Marita Kansas.

## 2015-04-20 NOTE — Telephone Encounter (Signed)
Marita Kansas called and requested to speak with Dr. Leonie Man regarding the patient. She states that the patient is having surgery tomorrow and Dr. Marcelino Scot has some questions for Dr. Leonie Man before the procedure. Please call and advise.

## 2015-04-20 NOTE — Interval H&P Note (Signed)
History and Physical Interval Note:  04/20/2015 10:30 AM  Justin [name]  has presented today for surgery, with the diagnosis of left distal tibia fracture  The various methods of treatment have been discussed with the patient and family. After consideration of risks, benefits and other options for treatment, the patient has consented to  Procedure(s): OPEN REDUCTION INTERNAL FIXATION (ORIF) LEFT DISTAL TIBIA FRACTURE (Left) as a surgical intervention .  The patient's history has been reviewed, patient examined, no change in status, stable for surgery.  I have reviewed the patient's chart and labs.  Questions were answered to the patient's satisfaction.    No significant interval change since last inpatient stay Pt will continue with plavix given 2nd stroke of unknown etiology    Jeffry Vogelsang W

## 2015-04-21 ENCOUNTER — Inpatient Hospital Stay (HOSPITAL_COMMUNITY)
Admission: RE | Admit: 2015-04-21 | Payer: BLUE CROSS/BLUE SHIELD | Source: Ambulatory Visit | Admitting: Orthopedic Surgery

## 2015-04-21 HISTORY — DX: Patent foramen ovale: Q21.12

## 2015-04-21 HISTORY — DX: Atrial septal defect: Q21.1

## 2015-04-21 SURGERY — OPEN REDUCTION INTERNAL FIXATION (ORIF) TIBIA FRACTURE
Anesthesia: General | Laterality: Left

## 2015-04-21 NOTE — Telephone Encounter (Signed)
I called yesterday and today but unable to leave a message

## 2015-04-28 ENCOUNTER — Encounter: Payer: Self-pay | Admitting: Internal Medicine

## 2015-05-09 ENCOUNTER — Ambulatory Visit (INDEPENDENT_AMBULATORY_CARE_PROVIDER_SITE_OTHER): Payer: BLUE CROSS/BLUE SHIELD

## 2015-05-09 DIAGNOSIS — I639 Cerebral infarction, unspecified: Secondary | ICD-10-CM | POA: Diagnosis not present

## 2015-05-10 NOTE — Discharge Summary (Signed)
Orthopaedic Trauma Service (OTS)  Patient ID: Justin [name] MRN: [id number] DOB/AGE: 01/27/1951 64 y.o.  Admit date: 03/22/2015 Discharge date:03/31/2015  Admission Diagnoses: Open left Pilon fracture Left shoulder dislocation History of Bell's palsy History of CVA  Discharge Diagnoses:  Principal Problem:   Open displaced pilon fracture of left tibia, type III Active Problems:   Bell's palsy   Open fracture of left tibia and fibula   Cerebral infarction due to embolism of cerebral artery   Embolic stroke   PFO (patent foramen ovale)   Dislocation of left shoulder joint   Procedures Performed: 03/22/2015- Dr. Lyla Glassing   1. Closed reduction of left shoulder dislocation. 2. Debridement of skin, subcutaneous tissue, muscle, and bone, left     open pilon fracture. 3. Placement of spanning left lower extremity external fixator. 4. Closure of left lower extremity traumatic laceration, 11 cm in     length.  03/24/2015- Dr. Marcelino Scot 1. Open reduction and internal fixation of left pilon, tibia and     fibula. 2. Incision and drainage of left open fracture with debridement of     bone. 3. Closed reduction of left shoulder dislocation. 4. Stress fluoro examination of the left shoulder following reduction  03/25/2015- carotid ultrasound Summary:  - The vertebral arteries appear patent with antegrade flow. - Findings consistent with 1-39 percent stenosis involving the   right internal carotid artery and the left internal carotid   artery.  03/25/2015- bilateral lower extremity ultrasound Summary:  - No evidence of deep vein thrombosis involving the right lower   extremity. - No evidence of deep vein thrombosis involving the left lower   extremity.  03/25/2015- transthoracic echocardiogram Study Conclusions  - Left ventricle: The cavity size was normal. Wall thickness was   increased in a pattern of mild LVH. The estimated ejection   fraction was 65%. Wall motion was  normal; there were no regional   wall motion abnormalities. - Right ventricle: The cavity size was mildly decreased. Systolic   function was mildly reduced. - Pulmonary arteries: PA peak pressure: 43 mm Hg (S). - Pericardium, extracardiac: A trivial pericardial effusion was   identified posterior to the heart. - Impressions: No cardiac source of embolism was identified, but   cannot be ruled out on the basis of this examination.   Discharged Condition: good, transferred to inpatient rehabilitation  Hospital Course:   64 year old right-hand-dominant male sustained an injury after falling off a ladder. Patient was working on his bone when he fell off ladder resulting in open fracture to his left ankle. As well as a dislocation to his left shoulder. Patient managed to crawl back to his house and call for help. Patient was brought to the hospital taken to the operating room by Dr. Delfino Lovett on the date of presentation. Patient had a close reduction of the shoulder and application of a spanning external fixator that day.  Due to the complexity of the constellation of his injuries was felt that a fellowship trained orthopedic traumatologist was needed to manage his injuries. On our date of consult and did obtain post reduction labs which showed persistent dislocation of his left shoulder and stable appearing left distal tibia fracture. Patient taken back to the OR today for repeat reduction of his left shoulder was performed successfully. Was taken through full range of motion intraoperatively was found to be stable. Repeat I and D with flexible nailing of his fibula was performed at that time and adjustment of his fixator. Patient tolerated this  well. Overnight in the postoperative day #1 following his second procedure the nurses noted some on usual behavior with some excessive somnolence and some blurred vision. Code stroke was called neurology was contacted and patient was found to have acute ischemia of  his left medial occipital lobe. Extensive workup was performed by neurology. We also did a consult cardiology to interrogate his loop recorder. No arrhythmias were found. This was patient's second cryptogenic stroke in several years. After his first one he was found to have a patent foramen ovale. Patient was on baby aspirin prior to admission. This was changed to Plavix after neurology eval  Patient progressed well over the next several days and was able to process pain with therapy. Inpatient rehabilitation was consulted appropriateness of admission to their facility. Patient was found to be a candidate and on postoperative day #7 was deemed to be stable for discharge to inpatient rehabilitation.  Consults: cardiology, neurology and rehabilitation medicine  Dr. Leonie Man- neurology Dr. Erlinda Hong- neurology Dr. Armida Sans- neurology Dr. Naaman Plummer- physical medicine and rehabilitation Dr. Ellyn Hack- cardiology  Significant Diagnostic Studies:   Imaging  CT head without contrast      IMPRESSION: Acute LEFT posterior cerebral artery territory infarct.   Small area LEFT parietal encephalomalacia consistent with remote ischemia. Minimal white matter changes most consistent chronic small vessel ischemic disease.  MRA head without contrast      IMPRESSION: 1. Acute medial left occipital lobe infarct with minimal blood products along the anterior periphery of the infarct. 2. Additional scattered infarcts are present over the watershed distributions bilaterally. This may be secondary to the watershed type infarct first is multiple emboli. 3. A left parietal infarct focus demonstrates additional blood products. 4. Minimal ethmoid sinus disease. 5. Normal MRA circle of Willis without evidence for significant proximal stenosis, aneurysm, or branch vessel occlusion.  MR venogram pelvis with and without contrast IMPRESSION: Unremarkable MR venogram of the pelvis, with flow signal maintained within the proximal  femoral veins, bilateral iliac veins, and the visualized IVC  MR left shoulder without contrast 1. Small osseous Bankart lesion with avulsion of the anterior and inferior aspects of the labrum. 2. Avulsion of the glenoid attachment of the inferior glenohumeral ligament. 3. Multiple muscle strains around the shoulder. 4. Tear of the posterior capsule 5. Prominent Hill-Sachs lesion  CT left ankle without contrast FINDINGS: Improved position and alignment of the severely comminuted intra-articular distal tibia fracture. There is a single percutaneous smooth pain and transfixing the medial malleolus fracture. There is also a percutaneous smooth intramedullary pin across the distal fibular fracture.   The external fixator is noted. The ankle mortise is relatively normally maintained. Maximum depression at the articular surface of the tibia is 4.8 mm.   The fibular fracture is in good position and alignment.   There is extensive air throughout the soft tissues and joint consistent with an open fracture.   Treatments: IV hydration, antibiotics: Ancef, gentamycin and penicillin G, analgesia: acetaminophen, Dilaudid and OxyIR, anticoagulation: Plavix and LMW heparin, therapies: PT, OT and RN and surgery: As above  Discharge Exam:   Orthopaedic Trauma Service Progress Note  Subjective  Doing well No complaints Ready to go to rehabilitation  ROS  As above  Objective   BP 130/77 mmHg  Pulse 96  Temp(Src) 98.4 F (36.9 C) (Oral)  Resp 18  Ht 5\' 9"  (1.753 m)  Wt 85 kg (187 lb 6.3 oz)  BMI 27.66 kg/m2  SpO2 95%  Intake/Output  06/15 0701 - 06/16 0700 06/16 0701 - 06/17 0700    P.O. 720     Total Intake(mL/kg) 720 (8.5)     Urine (mL/kg/hr) 510 (0.3)     Stool 0 (0)     Total Output 510      Net +210            Urine Occurrence 2 x     Stool Occurrence 1 x       Labs   No new labs  Exam  Gen: awake and alert, resting comfortably, NAD Skin:  maculopapular rash to posterior trunk, much improved   Lungs:clear anterior fields Cardiac: RRR, s1 and s2 Abd: +BS, NTND Ext:   Left Upper Extremity                            Motor and sensory functions grossly intact             Ext warm             + radial pulse               Elbow ROM improved               Pt tolerated gentle abduction and FF of shoulder        Left Lower Extremity               Ex fix stable and intact, dressing stable                           Moderate swelling still present to L lower extremity                            DPN, SPN, TN sensation grossly intact             Ext warm             EHL, FHL, lesser toe motor functions intact    Assessment and Plan   POD/HD#: 40   64 y/o RHD male s/p fall off ladder    1. Grade 3 open left distal tib-fib fracture status post I&D and external fixation revision, closed left anterior shoulder dislocation status post re-reduction              ex fix revised and repeat I&D performed             Flexible fibular nail placed and tibia pinned with k-wire               Shoulder reduced again and was stable with stressing in OR               CT reviewed, would likely benefit ORIF of distal tibia due to malalignment of posterior plafond,  posterior approach given fx pattern and traumatic wound.                              Will recheck soft tissue on Monday                            WBAT L upper extremity with platform walker               NWB L LEx  Gentle L shoulder motion- pendulums, abduction and FF             Unrestricted ROM L elbow, forearm, wrist and hand               Pt can use L arm to assist with mobilization               Continue with ice and elevation             pinsite care as needed  2. Pain management:             Continue with current management  3. ABL anemia/Hemodynamics             Stable    4. Medical issues              acute ischemia left medial occipital  lobe, likely embolism to the left PCA.                         Per Stroke team                         Pt started on plavix as this is his 2nd cryptogenic stroke                           workup has been negative to date                         hypercoag labs still pending   5. DVT/PE prophylaxis:             continue lovenox   6. ID:               IV abx completed for open fracture treatment  7. Metabolic Bone Disease:             vitamin d levels borderline low                         Add vitamin d 3 1000 IUs bid                           Add vitamin d 2 50000 IUs daily x 8 weeks                           Hgb A1c 5.9%  8. Activity:             Activity as tolerated             Non-Weightbearing left lower extremity             WBAT L upper extremity with platform                 9. FEN/Foley/Lines:           reg diet as tolerated   10.Ex-fix/Splint care:             ok to manipulate extremity by fixator             Change pinsite dressings as needed  11. Skin rash             Possible drug rash             IV abx completed  lyrica stopped               currently on vistaril PRN for itching             Hold on hydrocortisone cream                12. Dispo:                        Stable for transfer to CIR if bed available               Recheck soft tissues on Monday    Disposition: Inpatient rehabilitation   Meds  Please see inpatient rehabilitation H&P for updated active medication list   Medication List    STOP taking these medications        acyclovir 800 MG tablet  Commonly known as:  ZOVIRAX     aspirin 325 MG tablet     predniSONE 10 MG tablet  Commonly known as:  DELTASONE     zoster vaccine live (PF) 19400 UNT/0.65ML injection  Commonly known as:  ZOSTAVAX      TAKE these medications        simvastatin 10 MG tablet  Commonly known as:  ZOCOR  Take 10 mg by mouth daily.           Follow-up Information    Follow up with  SETHI,PRAMOD, MD In 2 months.   Specialties:  Neurology, Radiology   Why:  stroke clinic, office will call you for follow up appointment   Contact information:   [place] [address] [phone]       Discharge Instructions and Plan:  64 y/o RHD male s/p fall off ladder   1. Grade 3 open left distal tib-fib fracture status post I&D and external fixation revision, closed left anterior shoulder dislocation status post re-reduction              ex fix revised and repeat I&D performed             Flexible fibular nail placed and tibia pinned with k-wire               Shoulder reduced again and was stable with stressing in OR               CT reviewed, would likely benefit ORIF of distal tibia due to malalignment of posterior plafond,  posterior approach given fx pattern and traumatic wound.                              Will recheck soft tissue on Monday                            WBAT L upper extremity with platform walker               NWB L LEx                      Gentle L shoulder motion- pendulums, abduction and FF             Unrestricted ROM L elbow, forearm, wrist and hand               Pt can use L arm to assist with mobilization  Continue with ice and elevation             pinsite care as needed  2. Pain management:             Continue with current management  3. ABL anemia/Hemodynamics             Stable    4. Medical issues              acute ischemia left medial occipital lobe, likely embolism to the left PCA.                         Per Stroke team                         Pt started on plavix as this is his 2nd cryptogenic stroke                           workup has been negative to date                         hypercoag labs still pending   5. DVT/PE prophylaxis:             continue lovenox   6. ID:               IV abx completed for open fracture treatment  7. Metabolic Bone Disease:             vitamin d  levels borderline low                         Add vitamin d 3 1000 IUs bid                           Add vitamin d 2 50000 IUs daily x 8 weeks                           Hgb A1c 5.9%  8. Activity:             Activity as tolerated             Non-Weightbearing left lower extremity             WBAT L upper extremity with platform                 9. FEN/Foley/Lines:           reg diet as tolerated   10.Ex-fix/Splint care:             ok to manipulate extremity by fixator             Change pinsite dressings as needed  11. Skin rash             Possible drug rash             IV abx completed             lyrica stopped               currently on vistaril PRN for itching             Hold on hydrocortisone cream                12. Dispo:  Stable for transfer to CIR if bed available               Recheck soft tissues on Monday    Signed:  [name], PA-C [facility] [phone] (P) 05/10/2015, 8:51 AM

## 2015-05-16 ENCOUNTER — Encounter: Payer: Self-pay | Admitting: Internal Medicine

## 2015-05-18 NOTE — Progress Notes (Signed)
Loop recorder 

## 2015-05-20 ENCOUNTER — Encounter (HOSPITAL_COMMUNITY): Payer: Self-pay | Admitting: *Deleted

## 2015-05-20 MED ORDER — CEFAZOLIN SODIUM-DEXTROSE 2-3 GM-% IV SOLR: 2.0000 g | INTRAVENOUS | Status: DC

## 2015-05-20 MED ORDER — CHLORHEXIDINE GLUCONATE 4 % EX LIQD: 60.0000 mL | Freq: Once | CUTANEOUS | Status: DC

## 2015-05-20 MED ORDER — LACTATED RINGERS IV SOLN: INTRAVENOUS | Status: DC

## 2015-05-20 NOTE — Progress Notes (Signed)
I spoke with Dr Deatra Canter regarding patients history of PFO, CVAs and surgery for Monday. Dr Ola Spurr instructed me to get information from the MDs that Dr Marcelino Scot spoke to about surgery.  ( patient had tod me that Dr handy spoke with Dr Leonie Man.)

## 2015-05-23 ENCOUNTER — Ambulatory Visit (HOSPITAL_COMMUNITY): Payer: BLUE CROSS/BLUE SHIELD | Admitting: Vascular Surgery

## 2015-05-23 ENCOUNTER — Ambulatory Visit (HOSPITAL_COMMUNITY)
Admission: RE | Admit: 2015-05-23 | Discharge: 2015-05-23 | Disposition: A | Discharge status: Home / Self Care | Payer: BLUE CROSS/BLUE SHIELD | Source: Ambulatory Visit | Attending: Orthopedic Surgery | Admitting: Orthopedic Surgery

## 2015-05-23 ENCOUNTER — Encounter (HOSPITAL_COMMUNITY)
Admission: RE | Disposition: A | Discharge status: Home / Self Care | Payer: Self-pay | Source: Ambulatory Visit | Attending: Orthopedic Surgery

## 2015-05-23 ENCOUNTER — Encounter (HOSPITAL_COMMUNITY): Payer: Self-pay | Admitting: Surgery

## 2015-05-23 DIAGNOSIS — E780 Pure hypercholesterolemia: Secondary | ICD-10-CM | POA: Insufficient documentation

## 2015-05-23 DIAGNOSIS — S82872D Displaced pilon fracture of left tibia, subsequent encounter for closed fracture with routine healing: Secondary | ICD-10-CM | POA: Diagnosis not present

## 2015-05-23 DIAGNOSIS — Q211 Atrial septal defect: Secondary | ICD-10-CM | POA: Insufficient documentation

## 2015-05-23 DIAGNOSIS — W11XXXD Fall on and from ladder, subsequent encounter: Secondary | ICD-10-CM | POA: Insufficient documentation

## 2015-05-23 DIAGNOSIS — Z8673 Personal history of transient ischemic attack (TIA), and cerebral infarction without residual deficits: Secondary | ICD-10-CM | POA: Insufficient documentation

## 2015-05-23 HISTORY — PX: EXTERNAL FIXATION REMOVAL: SHX5040

## 2015-05-23 LAB — BASIC METABOLIC PANEL
Anion gap: 9 (ref 5–15)
BUN: 15 mg/dL (ref 6–20)
CO2: 25 mmol/L (ref 22–32)
CREATININE: 0.92 mg/dL (ref 0.61–1.24)
Calcium: 9.9 mg/dL (ref 8.9–10.3)
Chloride: 105 mmol/L (ref 101–111)
GFR calc non Af Amer: >60 mL/min (ref >60)
Glucose, Bld: 113 mg/dL — ABNORMAL HIGH (ref 65–99)
POTASSIUM: 4.4 mmol/L (ref 3.5–5.1)
Sodium: 139 mmol/L (ref 135–145)

## 2015-05-23 LAB — CBC
HCT: 44.1 % (ref 39.0–52.0)
Hemoglobin: 14.5 g/dL (ref 13.0–17.0)
MCH: 28.2 pg (ref 26.0–34.0)
MCHC: 32.9 g/dL (ref 30.0–36.0)
MCV: 85.8 fL (ref 78.0–100.0)
PLATELETS: 227 10*3/uL (ref 150–400)
RBC: 5.14 MIL/uL (ref 4.22–5.81)
RDW: 13.5 % (ref 11.5–15.5)
WBC: 6.3 10*3/uL (ref 4.0–10.5)

## 2015-05-23 SURGERY — REMOVAL, EXTERNAL FIXATION DEVICE, LOWER EXTREMITY
Anesthesia: Monitor Anesthesia Care | Site: Leg Lower | Laterality: Left

## 2015-05-23 MED ORDER — MIDAZOLAM HCL 5 MG/5ML IJ SOLN
INTRAMUSCULAR | Status: DC | PRN
  Administered 2015-05-23 (×2): 1 mg via INTRAVENOUS

## 2015-05-23 MED ORDER — LACTATED RINGERS IV SOLN
INTRAVENOUS | Status: DC
  Administered 2015-05-23 (×2): via INTRAVENOUS

## 2015-05-23 MED ORDER — MIDAZOLAM HCL 2 MG/2ML IJ SOLN
INTRAMUSCULAR | Status: AC
  Administered 2015-05-23: 2 mg
  Filled 2015-05-23: qty 2

## 2015-05-23 MED ORDER — PROPOFOL 10 MG/ML IV BOLUS
INTRAVENOUS | Status: AC
  Filled 2015-05-23: qty 20

## 2015-05-23 MED ORDER — CEFAZOLIN SODIUM-DEXTROSE 2-3 GM-% IV SOLR
2.0000 g | INTRAVENOUS | Status: AC
  Administered 2015-05-23: 2 g via INTRAVENOUS
  Filled 2015-05-23: qty 50

## 2015-05-23 MED ORDER — FENTANYL CITRATE (PF) 250 MCG/5ML IJ SOLN
INTRAMUSCULAR | Status: AC
  Filled 2015-05-23: qty 5

## 2015-05-23 MED ORDER — CHLORHEXIDINE GLUCONATE 4 % EX LIQD: 60.0000 mL | Freq: Once | CUTANEOUS | Status: DC

## 2015-05-23 MED ORDER — FENTANYL CITRATE (PF) 100 MCG/2ML IJ SOLN
INTRAMUSCULAR | Status: DC | PRN
  Administered 2015-05-23: 50 ug via INTRAVENOUS

## 2015-05-23 MED ORDER — FENTANYL CITRATE (PF) 100 MCG/2ML IJ SOLN
INTRAMUSCULAR | Status: AC
  Administered 2015-05-23: 100 ug
  Filled 2015-05-23: qty 2

## 2015-05-23 MED ORDER — TRAMADOL HCL 50 MG PO TABS
50.0000 mg | ORAL_TABLET | Freq: Four times a day (QID) | ORAL | Status: DC | PRN
Start: 2015-05-23 — End: 2015-07-25

## 2015-05-23 SURGICAL SUPPLY — 64 items
BANDAGE ELASTIC 4 VELCRO ST LF (GAUZE/BANDAGES/DRESSINGS) ×3 IMPLANT
BANDAGE ELASTIC 6 VELCRO ST LF (GAUZE/BANDAGES/DRESSINGS) ×3 IMPLANT
BANDAGE ESMARK 6X9 LF (GAUZE/BANDAGES/DRESSINGS) ×1 IMPLANT
BNDG CMPR 9X6 STRL LF SNTH (GAUZE/BANDAGES/DRESSINGS) ×1
BNDG COHESIVE 6X5 TAN STRL LF (GAUZE/BANDAGES/DRESSINGS) ×3 IMPLANT
BNDG ESMARK 6X9 LF (GAUZE/BANDAGES/DRESSINGS) ×3
BNDG GAUZE ELAST 4 BULKY (GAUZE/BANDAGES/DRESSINGS) ×3 IMPLANT
BRUSH SCRUB DISP (MISCELLANEOUS) ×6 IMPLANT
CLEANER TIP ELECTROSURG 2X2 (MISCELLANEOUS) ×3 IMPLANT
CLOSURE WOUND 1/2 X4 (GAUZE/BANDAGES/DRESSINGS)
COVER SURGICAL LIGHT HANDLE (MISCELLANEOUS) ×6 IMPLANT
CUFF TOURNIQUET SINGLE 18IN (TOURNIQUET CUFF) IMPLANT
CUFF TOURNIQUET SINGLE 24IN (TOURNIQUET CUFF) IMPLANT
CUFF TOURNIQUET SINGLE 34IN LL (TOURNIQUET CUFF) IMPLANT
DRAPE C-ARM 42X72 X-RAY (DRAPES) IMPLANT
DRAPE C-ARMOR (DRAPES) ×3 IMPLANT
DRAPE OEC MINIVIEW 54X84 (DRAPES) ×3 IMPLANT
DRAPE U-SHAPE 47X51 STRL (DRAPES) ×3 IMPLANT
DRESSING ADAPTIC 1/2  N-ADH (PACKING) ×2 IMPLANT
DRSG ADAPTIC 3X8 NADH LF (GAUZE/BANDAGES/DRESSINGS) ×3 IMPLANT
DRSG PAD ABDOMINAL 8X10 ST (GAUZE/BANDAGES/DRESSINGS) ×2 IMPLANT
ELECT REM PT RETURN 9FT ADLT (ELECTROSURGICAL) ×3
ELECTRODE REM PT RTRN 9FT ADLT (ELECTROSURGICAL) ×1 IMPLANT
EVACUATOR 1/8 PVC DRAIN (DRAIN) IMPLANT
GAUZE SPONGE 4X4 12PLY STRL (GAUZE/BANDAGES/DRESSINGS) ×3 IMPLANT
GLOVE BIO SURGEON STRL SZ7.5 (GLOVE) ×3 IMPLANT
GLOVE BIO SURGEON STRL SZ8 (GLOVE) ×3 IMPLANT
GLOVE BIOGEL PI IND STRL 7.5 (GLOVE) ×1 IMPLANT
GLOVE BIOGEL PI IND STRL 8 (GLOVE) ×1 IMPLANT
GLOVE BIOGEL PI INDICATOR 7.5 (GLOVE) ×2
GLOVE BIOGEL PI INDICATOR 8 (GLOVE) ×2
GOWN STRL REUS W/ TWL LRG LVL3 (GOWN DISPOSABLE) ×2 IMPLANT
GOWN STRL REUS W/ TWL XL LVL3 (GOWN DISPOSABLE) ×1 IMPLANT
GOWN STRL REUS W/TWL LRG LVL3 (GOWN DISPOSABLE) ×6
GOWN STRL REUS W/TWL XL LVL3 (GOWN DISPOSABLE) ×3
KIT BASIN OR (CUSTOM PROCEDURE TRAY) ×3 IMPLANT
KIT ROOM TURNOVER OR (KITS) ×3 IMPLANT
MANIFOLD NEPTUNE II (INSTRUMENTS) ×3 IMPLANT
NEEDLE 22X1 1/2 (OR ONLY) (NEEDLE) IMPLANT
NS IRRIG 1000ML POUR BTL (IV SOLUTION) ×3 IMPLANT
PACK ORTHO EXTREMITY (CUSTOM PROCEDURE TRAY) ×3 IMPLANT
PAD ARMBOARD 7.5X6 YLW CONV (MISCELLANEOUS) ×6 IMPLANT
PADDING CAST COTTON 6X4 STRL (CAST SUPPLIES) ×9 IMPLANT
SPONGE GAUZE 4X4 12PLY STER LF (GAUZE/BANDAGES/DRESSINGS) ×2 IMPLANT
SPONGE LAP 18X18 X RAY DECT (DISPOSABLE) ×3 IMPLANT
SPONGE SCRUB IODOPHOR (GAUZE/BANDAGES/DRESSINGS) ×3 IMPLANT
STAPLER VISISTAT 35W (STAPLE) IMPLANT
STOCKINETTE IMPERVIOUS LG (DRAPES) ×3 IMPLANT
STRIP CLOSURE SKIN 1/2X4 (GAUZE/BANDAGES/DRESSINGS) IMPLANT
SUCTION FRAZIER TIP 10 FR DISP (SUCTIONS) IMPLANT
SUT ETHILON 3 0 PS 1 (SUTURE) IMPLANT
SUT PDS AB 2-0 CT1 27 (SUTURE) IMPLANT
SUT VIC AB 0 CT1 27 (SUTURE)
SUT VIC AB 0 CT1 27XBRD ANBCTR (SUTURE) IMPLANT
SUT VIC AB 2-0 CT1 27 (SUTURE)
SUT VIC AB 2-0 CT1 TAPERPNT 27 (SUTURE) IMPLANT
SYR CONTROL 10ML LL (SYRINGE) IMPLANT
TOWEL OR 17X24 6PK STRL BLUE (TOWEL DISPOSABLE) ×6 IMPLANT
TOWEL OR 17X26 10 PK STRL BLUE (TOWEL DISPOSABLE) ×6 IMPLANT
TUBE CONNECTING 12'X1/4 (SUCTIONS) ×1
TUBE CONNECTING 12X1/4 (SUCTIONS) ×2 IMPLANT
UNDERPAD 30X30 INCONTINENT (UNDERPADS AND DIAPERS) ×3 IMPLANT
WATER STERILE IRR 1000ML POUR (IV SOLUTION) ×6 IMPLANT
YANKAUER SUCT BULB TIP NO VENT (SUCTIONS) ×3 IMPLANT

## 2015-05-23 NOTE — Brief Op Note (Signed)
05/23/2015  2:26 PM  PATIENT:  Justin [name]  64 y.o. male  PRE-OPERATIVE DIAGNOSIS:  RETAINED EXTERNAL FIXATION LEFT PILON  POST-OPERATIVE DIAGNOSIS:  RETAINED EXTERNAL FIXATION LEFT PILON  PROCEDUREs:  Procedure(s): 1. REMOVAL EXTERNAL FIXATION LEFT LEG (Left) 2. Curettage calcaneus ulceration 3. Tibia superficial implant removal  SURGEON:  Surgeon(s) and Role:    * Altamese Clover, MD - Primary  PHYSICIAN ASSISTANT: None  ANESTHESIA:   regional  I/O:  Total I/O In: -  Out: 20 [Blood:20]  SPECIMEN:  No Specimen  TOURNIQUET:  * No tourniquets in log *  DICTATION: .Other Dictation: Dictation Number [id number]

## 2015-05-23 NOTE — Transfer of Care (Signed)
Immediate Anesthesia Transfer of Care Note  Patient: Justin [name]  Procedure(s) Performed: Procedure(s): REMOVAL EXTERNAL FIXATION LEFT LEG (Left)  Patient Location: PACU  Anesthesia Type:MAC and Regional  Level of Consciousness: awake, alert , oriented and sedated  Airway & Oxygen Therapy: Patient Spontanous Breathing and Patient connected to nasal cannula oxygen  Post-op Assessment: Report given to RN, Post -op Vital signs reviewed and stable and Patient moving all extremities  Post vital signs: Reviewed and stable  Last Vitals:  Filed Vitals:   05/23/15 1305  BP:   Pulse: 105  Temp:   Resp: 23    Complications: No apparent anesthesia complications

## 2015-05-23 NOTE — Anesthesia Postprocedure Evaluation (Signed)
  Anesthesia Post-op Note  Patient: Justin [name]  Procedure(s) Performed: Procedure(s): REMOVAL EXTERNAL FIXATION LEFT LEG (Left)  Patient Location: PACU  Anesthesia Type: Regional, MAC   Level of Consciousness: awake, alert  and oriented  Airway and Oxygen Therapy: Patient Spontanous Breathing  Post-op Pain: mild  Post-op Assessment: Post-op Vital signs reviewed  Post-op Vital Signs: Reviewed  Last Vitals:  Filed Vitals:   05/23/15 1547  BP:   Pulse:   Temp: 36.6 C  Resp:     Complications: No apparent anesthesia complications

## 2015-05-23 NOTE — H&P (Signed)
Orthopaedic Trauma Service   Chief Complaint:  Retained Ex fix Left leg HPI:   64 y/o male sustained open L tibial pilon fracture 03/22/2015 after falling off ladder. Treated definitively with ex fix. Pt did suffer a stroke during that hospitalization w/o any apparent sequela. Presents today for removal of ex fix and k wire   Past Medical History  Diagnosis Date  . Bell's palsy 1980's; [id number]; 2000's; 12/2012; 03/2013    "multiple times" (04/21/2013)  . TIA (transient ischemic attack) 04/21/2013  . Hypercholesteremia   . Dislocation of left shoulder joint 03/31/2015  . PFO (patent foramen ovale)     PFO by 04/24/13 TEE  . Stroke 2014    No residual from 2014.  Stroke 03/2015 - Right side effective- "mopst of strenghthas come back."    Past Surgical History  Procedure Laterality Date  . Tee without cardioversion N/A 04/24/2013    Procedure: TRANSESOPHAGEAL ECHOCARDIOGRAM (TEE);  Surgeon: Thayer Headings, MD;  Location: [facility];  Service: Cardiovascular;  Laterality: N/A;  . Implanted loop recorder    . Colonoscopy N/A 07/13/2013    Procedure: COLONOSCOPY;  Surgeon: Danie Binder, MD;  Location[facility];  Service: Endoscopy;  Laterality: N/A;  8:30 AM  . Loop recorder implant N/A 04/24/2013    Procedure: LOOP RECORDER IMPLANT;  Surgeon: Deboraha Sprang, MD;  Location: [facility] C[facility];  Service: Cardiovascular;  Laterality: N/A;  . Shoulder closed reduction Left 03/22/2015    Procedure: CLOSED REDUCTION SHOULDER;  Surgeon: Rod Can, MD;  Location: [place];  Service: Orthopedics;  Laterality: Left;  . External fixation leg Left 03/22/2015    Procedure: EXTERNAL FIXATION left ankle;  Surgeon: Rod Can, MD;  Location: [facility];  Service: Orthopedics;  Laterality: Left;  . I&d extremity Left 03/22/2015    Procedure: IRRIGATION AND DEBRIDEMENT OPEN TIBIA -FIBULA  FRACTURE;  Surgeon: Rod Can, MD;  Location: [place];  Service: Orthopedics;  Laterality: Left;  . External fixation leg Left  03/24/2015    Procedure: IRRIGATION AND DEBRIDEMENT  EXTERNAL FIXATURE REVISION LEFT ANKLE ;  Surgeon: Altamese [place]burg, MD;  Location: [place];  Service: Orthopedics;  Laterality: Left;    Family History  Problem Relation Age of Onset  . Heart disease Mother   . Alcohol abuse Mother   . Colon cancer Neg Hx    Social History:  reports that he has never smoked. He has never used smokeless tobacco. He reports that he does not drink alcohol or use illicit drugs.  Allergies:  Allergies  Allergen Reactions  . Lyrica [Pregabalin]     Edema     No prescriptions prior to admission    No results found for this or any previous visit (from the past 56 hour(s)). No results found.  Review of Systems  Constitutional: Negative for fever and chills.  Eyes: Negative for blurred vision and double vision.  Respiratory: Negative for shortness of breath and wheezing.   Cardiovascular: Negative for chest pain and palpitations.  Gastrointestinal: Negative for nausea, vomiting and abdominal pain.  Neurological: Negative for tingling and headaches.    There were no vitals taken for this visit. Physical Exam  Constitutional: He is oriented to person, place, and time. He appears well-developed and well-nourished. No distress.  HENT:  Head: Normocephalic and atraumatic.  Cardiovascular: Normal rate and regular rhythm.   Respiratory: Effort normal and breath sounds normal.  GI: Soft. Bowel sounds are normal. There is no tenderness.  Musculoskeletal:  Left Lower  Extremity  Ex fix stable All pinsites look great Traumatic medial wound stable Moderate swelling still present Improving swelling but still not adequate for surgery DPN, SPN, TN sensation grossly intact EHL, FHL, Lesser toe motor functions intact Ext warm + DP pulse    Neurological: He is alert and oriented to person, place, and  time.     Assessment/Plan  64 y/o male with retained ex fix left leg s/p open L pilon fracture  OR for removal of ex fix and k wire Place in splint for another 2 weeks vs CAM Likely NWB x 2 more weeks then GWB  Outpt procedure Follow up with Ortho 2 weeks  [name], PA-C [facility] [phone] (P) 05/23/2015, 9:35 AM

## 2015-05-23 NOTE — Anesthesia Preprocedure Evaluation (Addendum)
Anesthesia Evaluation  Patient identified by MRN, date of birth, ID band Patient awake    Reviewed: Allergy & Precautions, NPO status , Patient's Chart, lab work & pertinent test results  Airway Mallampati: II  TM Distance: >3 FB     Dental  (+) Teeth Intact, Dental Advisory Given   Pulmonary  breath sounds clear to auscultation        Cardiovascular Rhythm:Regular Rate:Normal     Neuro/Psych    GI/Hepatic   Endo/Other    Renal/GU      Musculoskeletal   Abdominal   Peds  Hematology   Anesthesia Other Findings   Reproductive/Obstetrics                             Anesthesia Physical Anesthesia Plan  ASA: III  Anesthesia Plan: MAC and Regional   Post-op Pain Management:    Induction:   Airway Management Planned:   Additional Equipment:   Intra-op Plan:   Post-operative Plan:   Informed Consent: I have reviewed the patients History and Physical, chart, labs and discussed the procedure including the risks, benefits and alternatives for the proposed anesthesia with the patient or authorized representative who has indicated his/her understanding and acceptance.   Dental advisory given  Plan Discussed with: CRNA, Anesthesiologist and Surgeon  Anesthesia Plan Comments:        Anesthesia Quick Evaluation

## 2015-05-23 NOTE — Discharge Instructions (Signed)
No weight bearing on the left ankle until follow up in the office. Return in 10-14 days for wound check.

## 2015-05-23 NOTE — Progress Notes (Signed)
Anesthesia follow-up: See my note from 04/20/15. Dr. Marcelino Scot attempted to talk with Dr. Leonie Man that day but apparently was unable. Dr. Marcelino Scot decided to postpone procedure at that time. Patient is now scheduled as a same day work up for today, but procedure now posted as removal of external fixation left leg. Case is posted for [facility]. Patient thought Dr. Marcelino Scot had spoken with Dr. Leonie Man. Dr. Carlean Jews office did not have any neurology documentation, but he is keeping patient on Plavix for this procedure and felt it could likely be done under MAC or regional anesthesia.  Since case is posted for this afternoon, I did update anesthesiologist Dr. Linna Caprice since he is currently assigned to this OR room.  He will follow-up with Dr. Marcelino Scot and assess patient on arrival.    Jamison Hugh [facility] Short Stay Center/Anesthesiology Phone [phone] 05/23/2015 11:42 AM

## 2015-05-24 ENCOUNTER — Encounter (HOSPITAL_COMMUNITY): Payer: Self-pay | Admitting: Orthopedic Surgery

## 2015-05-24 NOTE — Op Note (Signed)
NAMEYASHAS, CAMILLI NO.:  [id number]  MEDICAL RECORD NO.:  [id number]  LOCATION:  [facility]                         FACILITY:  [facility]  PHYSICIAN:  Astrid Divine. Marcelino Scot, M.D. DATE OF BIRTH:  [birth year]  DATE OF PROCEDURE:  05/23/2015 DATE OF DISCHARGE:  05/23/2015                              OPERATIVE REPORT   PREOPERATIVE DIAGNOSES: 1. Retained external fixator, left pilon fracture, tibia and fibula. 2. Retained K-wire, left tibia. 3. Ulcerated pin site, calcaneus.  POSTOPERATIVE DIAGNOSES: 1. Retained external fixator, left pilon fracture, tibia and fibula. 2. Retained K-wire, left tibia. 3. Ulcerated pin site, calcaneus.  PROCEDURES: 1. Removal of external fixator under anesthesia. 2. Curettage of the ulcerated pin site, left calcaneus. 3. Removal of superficial implant from the left tibia.  SURGEON:  Astrid Divine. Marcelino Scot, MD.  ASSISTANT:  None.  ANESTHESIA:  Regional, Dr. Glynda Jaeger.  SPECIMENS:  None.  DISPOSITION:  PACU.  CONDITION:  Stable.  BRIEF SUMMARY AND INDICATION FOR PROCEDURE:  Justin [name] is a 64 year old male, status post left open pilon fracture, treated with serial debridements and external fixation.  His postoperative course was complicated by stroke for which they have not identified a clear etiology.  The patient has been placed on Plavix and subsequent definitive reconstruction with plate osteosynthesis was deferred because of the stroke risk.  He now presents for removal of his external fixator under a regional anesthesia.  __________ combined with a curettage of his pin site ulceration.  For the most part, pin sites appeared to be quite healthy given their 8-week duration and also removal of the K-wire has been left in the medial side of the fixation for eventual removal. I did discuss with the patient risks and benefits of surgery. Preoperatively, indicating the possibility of occult nonunion, loss of reduction, infection,  stroke, heart attack, DVT, and need for further surgery among others.  He and his wife both acknowledged these risks and did wish to proceed.  BRIEF SUMMARY OF PROCEDURE:  Mr. Eakle received his popliteal block as well as the antibiotics.  He was taken to the operating room, where the fixator clamps and bars were loosened and then removed.  The pin sites did have areas of ulceration around the calcaneus in particular after removal of the pins from the tibia.  There was rather significant bleeding from the canal, which injured for several minutes.  I did aggressively scrub with Chlorhexidine scrub brush and I irrigated thoroughly and then I used a curette to debride the superficial area all the way down to the bone around the pin tracts ulcerations in the tibia as well as the calcaneus and metatarsals.  These were irrigated once more.  The leg was cleaned once again.  Fresh seen and then irrigated and fresh sterile dressings applied with Adaptic gauze, Kerlix, Webril, and then a posterior and stirrup splint.  The patient was taken to PACU in stable condition.  It should be noted, I also identified the K-wire medially and was able to grasp this with a clamp and then with pliers in stepwise fashion and removing without incident from the medial tibia.  PROGNOSIS:  Mr. Letizia will be nonweightbearing on  the left lower extremity for the next 10 days.  When he will return to the office for re-evaluation and wound check, at that time, I anticipate advancing him to weightbearing as tolerated in a Cam boot depending upon clinical examination and x-ray.  He remains on Plavix.     Astrid Divine. Marcelino Scot, M.D.     MHH/MEDQ  D:  05/23/2015  T:  05/24/2015  Job:  [id number]

## 2015-05-25 NOTE — Anesthesia Postprocedure Evaluation (Signed)
  Anesthesia Post-op Note  Patient: Justin [name]  Procedure(s) Performed: Procedure(s): REMOVAL EXTERNAL FIXATION LEFT LEG (Left)  Patient Location: PACU  Anesthesia Type:MAC and GA combined with regional for post-op pain  Level of Consciousness: awake, alert  and oriented  Airway and Oxygen Therapy: Patient Spontanous Breathing  Post-op Pain: none  Post-op Assessment: Post-op Vital signs reviewed, Patient's Cardiovascular Status Stable, Respiratory Function Stable, Patent Airway and Pain level controlled LLE Motor Response: No movement due to regional block LLE Sensation: Numbness          Post-op Vital Signs: stable  Last Vitals:  Filed Vitals:   05/23/15 1547  BP:   Pulse:   Temp: 36.6 C  Resp:     Complications: No apparent anesthesia complications

## 2015-05-27 LAB — CUP PACEART REMOTE DEVICE CHECK: Date Time Interrogation Session: 20160812160454

## 2015-05-30 ENCOUNTER — Encounter: Payer: Self-pay | Admitting: Internal Medicine

## 2015-05-31 ENCOUNTER — Encounter: Payer: Self-pay | Admitting: Internal Medicine

## 2015-06-01 ENCOUNTER — Encounter: Payer: Self-pay | Admitting: Internal Medicine

## 2015-06-01 ENCOUNTER — Telehealth: Payer: Self-pay | Admitting: *Deleted

## 2015-06-01 NOTE — Telephone Encounter (Signed)
Spoke with patient regarding tachy episode on LINQ transmission from 05/29/15.  Patient asymptomatic, states he may have been exercising at the time of the episode.  Patient appreciative of call and is aware to call with questions or concerns.

## 2015-06-03 ENCOUNTER — Other Ambulatory Visit: Payer: Self-pay | Admitting: Family Medicine

## 2015-06-03 ENCOUNTER — Encounter: Payer: Self-pay | Admitting: Family Medicine

## 2015-06-03 ENCOUNTER — Encounter: Payer: Self-pay | Admitting: Internal Medicine

## 2015-06-03 MED ORDER — CLOPIDOGREL BISULFATE 75 MG PO TABS: 75.0000 mg | ORAL_TABLET | Freq: Every day | ORAL | Status: DC

## 2015-06-06 MED ORDER — CLOPIDOGREL BISULFATE 75 MG PO TABS: 75.0000 mg | ORAL_TABLET | Freq: Every day | ORAL | Status: DC

## 2015-06-06 NOTE — Addendum Note (Signed)
Addended by: Sheral Flow on: 06/06/2015 03:05 PM   Modules accepted: Orders

## 2015-06-07 ENCOUNTER — Ambulatory Visit (INDEPENDENT_AMBULATORY_CARE_PROVIDER_SITE_OTHER): Payer: BLUE CROSS/BLUE SHIELD | Admitting: *Deleted

## 2015-06-07 ENCOUNTER — Encounter: Payer: Self-pay | Admitting: *Deleted

## 2015-06-07 DIAGNOSIS — I639 Cerebral infarction, unspecified: Secondary | ICD-10-CM | POA: Diagnosis not present

## 2015-06-07 LAB — CUP PACEART REMOTE DEVICE CHECK: MDC IDC SESS DTM: 20160829115357

## 2015-06-07 NOTE — Progress Notes (Signed)
Tachy episode from 06/03/15 printed for review. EGM suggests sinus tachycardia, rate 154bpm.  Monthly summary reports and ROV with Dr. Caryl Comes in 02/2016.

## 2015-06-08 NOTE — Progress Notes (Signed)
Loop recorder 

## 2015-06-12 NOTE — Progress Notes (Signed)
Sounds good  Lets look at the egam and see what we thnk of mechanism

## 2015-06-13 ENCOUNTER — Inpatient Hospital Stay: Payer: BLUE CROSS/BLUE SHIELD | Admitting: Physical Medicine & Rehabilitation

## 2015-06-17 ENCOUNTER — Telehealth: Payer: Self-pay | Admitting: *Deleted

## 2015-06-17 NOTE — Telephone Encounter (Signed)
Called patient to see when he is able to come to the [facility] for [facility] optimization (reprogramming tachy detection from 150bpm to 170bpm).  Patient states he will have to check with his wife and call back.  Gave him [facility] phone number.

## 2015-06-22 NOTE — Telephone Encounter (Signed)
Patient scheduled appointment with [facility] for 06/30/2015 at 2:30pm.

## 2015-06-30 ENCOUNTER — Ambulatory Visit (INDEPENDENT_AMBULATORY_CARE_PROVIDER_SITE_OTHER): Payer: BLUE CROSS/BLUE SHIELD | Admitting: *Deleted

## 2015-06-30 DIAGNOSIS — Z4509 Encounter for adjustment and management of other cardiac device: Secondary | ICD-10-CM

## 2015-06-30 LAB — CUP PACEART INCLINIC DEVICE CHECK
Date Time Interrogation Session: 20160915144515
MDC IDC SET ZONE DETECTION INTERVAL: 3000 ms
Zone Setting Detection Interval: 2000 ms
Zone Setting Detection Interval: 350 ms

## 2015-06-30 NOTE — Progress Notes (Signed)
ILR programming change only. 85 tachy episodes, 1 new since previous Carelink--- was normal exercise Sinus Tach. Changed tachy detection from 150 to 171bpm. Carelink summary reports QMO & ROV w/ SK 5/17.  Pt states since starting Plavix, he is having nocturnal restlessness. Pt agreed to contact Dr. Leonie Man to discuss an alternative anti-platelet if advised.

## 2015-07-05 ENCOUNTER — Encounter: Payer: Self-pay | Admitting: Internal Medicine

## 2015-07-06 ENCOUNTER — Encounter: Payer: Self-pay | Admitting: Internal Medicine

## 2015-07-07 ENCOUNTER — Ambulatory Visit (INDEPENDENT_AMBULATORY_CARE_PROVIDER_SITE_OTHER): Payer: BLUE CROSS/BLUE SHIELD | Admitting: *Deleted

## 2015-07-07 DIAGNOSIS — I639 Cerebral infarction, unspecified: Secondary | ICD-10-CM

## 2015-07-11 NOTE — Progress Notes (Signed)
Loop recorder 

## 2015-07-25 ENCOUNTER — Ambulatory Visit (INDEPENDENT_AMBULATORY_CARE_PROVIDER_SITE_OTHER): Payer: BLUE CROSS/BLUE SHIELD | Admitting: Neurology

## 2015-07-25 ENCOUNTER — Encounter: Payer: Self-pay | Admitting: Internal Medicine

## 2015-07-25 ENCOUNTER — Encounter: Payer: Self-pay | Admitting: Neurology

## 2015-07-25 VITALS — BP 134/93 | HR 87 | Ht 69.0 in | Wt 172.2 lb

## 2015-07-25 DIAGNOSIS — I639 Cerebral infarction, unspecified: Secondary | ICD-10-CM

## 2015-07-25 NOTE — Patient Instructions (Signed)
I had a long d/w patient and wife about his recent stroke, risk for recurrent stroke/TIAs, personally independently reviewed imaging studies and stroke evaluation results and answered questions.Continue Plavix  for secondary stroke prevention and maintain strict control of hypertension with blood pressure goal below 130/90, diabetes with hemoglobin A1c goal below 6.5% and lipids with LDL cholesterol goal below 100 mg/dL. I also advised the patient to eat a healthy diet with plenty of whole grains, cereals, fruits and vegetables, exercise regularly and maintain ideal body weight .I advised him to be careful with his driving particularly when changing lanes to the right. He may consider possible participation in the [place] trial and was given written information to review at home and decide. Followup in the future with me in 6 months or call earlier if necessary. Stroke Prevention Some medical conditions and behaviors are associated with an increased chance of having a stroke. You may prevent a stroke by making healthy choices and managing medical conditions. HOW CAN I REDUCE MY RISK OF HAVING A STROKE?   Stay physically active. Get at least 30 minutes of activity on most or all days.  Do not smoke. It may also be helpful to avoid exposure to secondhand smoke.  Limit alcohol use. Moderate alcohol use is considered to be:  No more than 2 drinks per day for men.  No more than 1 drink per day for nonpregnant women.  Eat healthy foods. This involves:  Eating 5 or more servings of fruits and vegetables a day.  Making dietary changes that address high blood pressure (hypertension), high cholesterol, diabetes, or obesity.  Manage your cholesterol levels.  Making food choices that are high in fiber and low in saturated fat, trans fat, and cholesterol may control cholesterol levels.  Take any prescribed medicines to control cholesterol as directed by your health care provider.  Manage your  diabetes.  Controlling your carbohydrate and sugar intake is recommended to manage diabetes.  Take any prescribed medicines to control diabetes as directed by your health care provider.  Control your hypertension.  Making food choices that are low in salt (sodium), saturated fat, trans fat, and cholesterol is recommended to manage hypertension.  Ask your health care provider if you need treatment to lower your blood pressure. Take any prescribed medicines to control hypertension as directed by your health care provider.  If you are 30-54 years of age, have your blood pressure checked every 3-5 years. If you are 64 years of age or older, have your blood pressure checked every year.  Maintain a healthy weight.  Reducing calorie intake and making food choices that are low in sodium, saturated fat, trans fat, and cholesterol are recommended to manage weight.  Stop drug abuse.  Avoid taking birth control pills.  Talk to your health care provider about the risks of taking birth control pills if you are over 27 years old, smoke, get migraines, or have ever had a blood clot.  Get evaluated for sleep disorders (sleep apnea).  Talk to your health care provider about getting a sleep evaluation if you snore a lot or have excessive sleepiness.  Take medicines only as directed by your health care provider.  For some people, aspirin or blood thinners (anticoagulants) are helpful in reducing the risk of forming abnormal blood clots that can lead to stroke. If you have the irregular heart rhythm of atrial fibrillation, you should be on a blood thinner unless there is a good reason you cannot take them.  Understand all your medicine instructions.  Make sure that other conditions (such as anemia or atherosclerosis) are addressed. SEEK IMMEDIATE MEDICAL CARE IF:   You have sudden weakness or numbness of the face, arm, or leg, especially on one side of the body.  Your face or eyelid droops to one  side.  You have sudden confusion.  You have trouble speaking (aphasia) or understanding.  You have sudden trouble seeing in one or both eyes.  You have sudden trouble walking.  You have dizziness.  You have a loss of balance or coordination.  You have a sudden, severe headache with no known cause.  You have new chest pain or an irregular heartbeat. Any of these symptoms may represent a serious problem that is an emergency. Do not wait to see if the symptoms will go away. Get medical help at once. Call your local emergency services (911 in [place] Do not drive yourself to the hospital.   This information is not intended to replace advice given to you by your health care provider. Make sure you discuss any questions you have with your health care provider.   Document Released: 11/08/2004 Document Revised: 10/22/2014 Document Reviewed: 04/03/2013 Elsevier Interactive Patient Education [employer].

## 2015-07-25 NOTE — Progress Notes (Signed)
[facility] [address] [place]. [place] [address] [phone]       OFFICE FOLLOW-UP NOTE  Mr. JASTIN FORE Date of Birth:  06-27-1951 Medical Record Number:  [id number]   HPI: 55 year Caucasian male seen for first office f/u visit after  [facility] admission for stroke on 04/21/13. He presented with inability to speak and right arm numbness for 20 minutes which resolved completely after reaching the emergency room.CT head was negative but MRI showed small scattered left hemispheric small infarcts while MRA showed no large vessel stenosis. 2DEcho showed normal ejection fraction and carotid dopplers showed no large vessel stenosis.LDL was minimally elevated at 103 and was started on zocor and aspirin. TEE showed no PFo or clot. He has had loop recorder inserted which has so far not recorded any atrial fibrillation.He states he has done well since discharge without any new neurovascular symptoms.he reports minor bruising on aspirin but has not had any bleeding.He is tolerating simvastatin without any myalgias.He has h/o Bell`s palsy in past. Update 07/25/15 : He returns for follow-up after last visit 2 years ago. He is here today for follow-up for another stroke in June 2016. On 03/22/15 he fell off a ladder with subsequent left sided grade 3A open left pilon fibula/ tibia fracture and left anterior shoulder dislocation. He required on 06/09 for I & D with ORIF left pilon, tibia and Fibula as well as CR of left shoulder redislocation. CT scan of the head showed acute left PCA infarct and MRI scan of the brain showed acute left medial occipital lobe infarct with additional small infarcts inbilateral occipital lobes and scattered cortical/white matter infarcts in watershed distribution bilaterally. No significant intracranial stenosis/ atherosclerosis noted on MRA. Carotid dopplers without significant ICA stenosis. BLE dopplers negative for DVT. Cardiac echo with EF 65% with no wall abnormality.  He was on aspirin which which was changed to Plavix for second stroke prevention. Patient had a previous stroke in July 2014 which was also embolic and had loop recorder placed at that time which was interrogated and atrial fibrillation was not found. Patient was transferred to inpatient rehabilitation and made gradual improvement. He states he has noted improvement in his right lower quadrant field cut but has in fact been able to drive without problems for the last few weeks now. His ankle seems to be healing quite well. Remains on Plavix which is tolerating well. He is also on Zocor and is tolerating it well without muscle aches or pains. He has no new complaints today. ROS:   14 system review of systems is positive for loss of peripheral vision only and all other systems negative  PMH:  Past Medical History  Diagnosis Date  . Bell's palsy 1980's; [id number]; 2000's; 12/2012; 03/2013    "multiple times" (04/21/2013)  . TIA (transient ischemic attack) 04/21/2013  . Hypercholesteremia   . Dislocation of left shoulder joint 03/31/2015  . PFO (patent foramen ovale)     PFO by 04/24/13 TEE  . Stroke ([place]) 2014    No residual from 2014.  Stroke 03/2015 - Right side effective- "mopst of strenghthas come back."  . Stroke ([place]) 2016  . Vision abnormalities     right eye vision    Social History:  Social History   Social History  . Marital Status: Married    Spouse Name: N/A  . Number of Children: 3  . Years of Education: college   Occupational History  . [employer]    Social  History Main Topics  . Smoking status: Never Smoker   . Smokeless tobacco: Never Used  . Alcohol Use: No     Comment: 03/22/2013 "glass of wine couple times/yr"  . Drug Use: No  . Sexual Activity: Yes   Other Topics Concern  . Not on file   Social History Narrative    Medications:   Current Outpatient Prescriptions on File Prior to Visit  Medication Sig Dispense Refill  . clopidogrel (PLAVIX) 75 MG tablet Take 1  tablet (75 mg total) by mouth daily. 30 tablet 3  . simvastatin (ZOCOR) 10 MG tablet Take 10 mg by mouth every evening.     . traMADol (ULTRAM) 50 MG tablet Take 1 tablet (50 mg total) by mouth every 6 (six) hours as needed for moderate pain. 45 tablet 0  . Vitamin D, Ergocalciferol, (DRISDOL) 50000 UNITS CAPS capsule Take 1 capsule (50,000 Units total) by mouth every 7 (seven) days. 4 capsule 1   No current facility-administered medications on file prior to visit.    Allergies:   Allergies  Allergen Reactions  . Lyrica [Pregabalin]     Edema   . Plavix [Clopidogrel] Anxiety    Nocturnal Restlessness    Physical Exam General: well developed, well nourished, seated, in no evident distress Head: head normocephalic and atraumatic.     Neck: supple with no carotid or supraclavicular bruits Cardiovascular: regular rate and rhythm, no murmurs Musculoskeletal: no deformity Skin:  no rash/petichiae Vascular:  Normal pulses all extremities Filed Vitals:   07/25/15 1558  BP: 134/93  Pulse: 87    Neurologic Exam Mental Status: Awake and fully alert. Oriented to place and time. Recent and remote memory intact. Attention span, concentration and fund of knowledge appropriate. Mood and affect appropriate.  Cranial Nerves: Fundoscopic exam reveals sharp disc margins. Pupils equal, briskly reactive to light. Extraocular movements full without nystagmus. Visual fields show partial right inferior quadrantanopsia to confrontation. Hearing intact. Facial sensation intact. Face, tongue, palate moves normally and symmetrically.  Motor: Normal bulk and tone. Normal strength in all tested extremity muscles. Favors left foot secondary to ankle pain Sensory.: intact to touch and pinprick and vibratory sensation.  Coordination: Rapid alternating movements normal in all extremities. Finger-to-nose and heel-to-shin performed accurately bilaterally. Gait and Station: Arises from chair without difficulty.  Stance is normal. Gait is antalgic with favoring left ankle Unable to heel, toe and tandem walk without difficulty.  Reflexes: 1+ and symmetric. Toes downgoing.   NIHSS  1 Modified Rankin  1   ASSESSMENT:  65 year male with embolic left MCA branch infarcts in July 2014 off cryptogenic etiology. Vascular risk factor of hyperlipidimia only. Recent bilateral occipital embolic infarcts in June 2016 with residual mild right lower quadrantanopsia.   PLAN: I had a long d/w patient and wife about his recent stroke, risk for recurrent stroke/TIAs, personally independently reviewed imaging studies and stroke evaluation results and answered questions.Continue Plavix  for secondary stroke prevention and maintain strict control of hypertension with blood pressure goal below 130/90, diabetes with hemoglobin A1c goal below 6.5% and lipids with LDL cholesterol goal below 100 mg/dL. I also advised the patient to eat a healthy diet with plenty of whole grains, cereals, fruits and vegetables, exercise regularly and maintain ideal body weight .I advised him to be careful with his driving particularly when changing lanes to the right. He may consider possible participation in the [place] trial and was given written information to review at home and decide. Greater  than 50% of time during this 25 minute visit was spent on counseling, review of diagnostic information, planning treatment and coordination of care Followup in the future with me in 6 months or call earlier if necessary.  Antony Contras, MD

## 2015-07-26 ENCOUNTER — Encounter: Payer: Self-pay | Admitting: Internal Medicine

## 2015-08-02 LAB — CUP PACEART REMOTE DEVICE CHECK: Date Time Interrogation Session: 20160922083557

## 2015-08-02 NOTE — Progress Notes (Signed)
Carelink summary report received. Battery status OK. Normal device function. 64 tachy episodes---hx exercise sinus tach, all episodes prior to zone reprogramming on 06/30/15. No new symptom episodes, brady, or pause episodes. No new AF episodes. Monthly summary reports and ROV w/ SK 5/17.

## 2015-08-05 ENCOUNTER — Encounter: Payer: Self-pay | Admitting: Internal Medicine

## 2015-08-08 ENCOUNTER — Encounter: Payer: Self-pay | Admitting: Internal Medicine

## 2015-08-08 ENCOUNTER — Ambulatory Visit (INDEPENDENT_AMBULATORY_CARE_PROVIDER_SITE_OTHER): Payer: BLUE CROSS/BLUE SHIELD | Admitting: *Deleted

## 2015-08-08 DIAGNOSIS — I639 Cerebral infarction, unspecified: Secondary | ICD-10-CM | POA: Diagnosis not present

## 2015-08-08 LAB — CUP PACEART REMOTE DEVICE CHECK: MDC IDC SESS DTM: 20161022090531

## 2015-08-08 NOTE — Progress Notes (Signed)
Loop recorder 

## 2015-08-12 DIAGNOSIS — S8292XB Unspecified fracture of left lower leg, initial encounter for open fracture type I or II: Secondary | ICD-10-CM | POA: Diagnosis not present

## 2015-08-12 DIAGNOSIS — I6930 Unspecified sequelae of cerebral infarction: Secondary | ICD-10-CM | POA: Diagnosis not present

## 2015-08-12 DIAGNOSIS — Z8673 Personal history of transient ischemic attack (TIA), and cerebral infarction without residual deficits: Secondary | ICD-10-CM

## 2015-08-12 DIAGNOSIS — S82872C Displaced pilon fracture of left tibia, initial encounter for open fracture type IIIA, IIIB, or IIIC: Secondary | ICD-10-CM | POA: Diagnosis not present

## 2015-08-12 DIAGNOSIS — G510 Bell's palsy: Secondary | ICD-10-CM

## 2015-08-12 DIAGNOSIS — S43005A Unspecified dislocation of left shoulder joint, initial encounter: Secondary | ICD-10-CM | POA: Diagnosis not present

## 2015-08-30 ENCOUNTER — Encounter: Payer: Self-pay | Admitting: Family Medicine

## 2015-08-30 ENCOUNTER — Other Ambulatory Visit: Payer: Self-pay | Admitting: Family Medicine

## 2015-08-30 NOTE — Telephone Encounter (Signed)
Refill appropriate and filled per protocol. 

## 2015-08-31 MED ORDER — SIMVASTATIN 10 MG PO TABS: 10.0000 mg | ORAL_TABLET | Freq: Every day | ORAL | Status: DC

## 2015-08-31 MED ORDER — CLOPIDOGREL BISULFATE 75 MG PO TABS: 75.0000 mg | ORAL_TABLET | Freq: Every day | ORAL | Status: DC

## 2015-09-05 ENCOUNTER — Ambulatory Visit (INDEPENDENT_AMBULATORY_CARE_PROVIDER_SITE_OTHER): Payer: BLUE CROSS/BLUE SHIELD | Admitting: *Deleted

## 2015-09-05 DIAGNOSIS — I639 Cerebral infarction, unspecified: Secondary | ICD-10-CM

## 2015-09-06 NOTE — Progress Notes (Signed)
LOOP RECORDER  

## 2015-09-09 NOTE — Progress Notes (Signed)
Carelink summary report received. Battery status OK. Normal device function. No new symptom episodes, tachy episodes, brady, or pause episodes. No new AF episodes. Monthly summary reports and ROV with SK in 02/2016. 

## 2015-10-03 ENCOUNTER — Other Ambulatory Visit: Payer: Self-pay | Admitting: Family Medicine

## 2015-10-03 MED ORDER — CLOPIDOGREL BISULFATE 75 MG PO TABS
75.0000 mg | ORAL_TABLET | Freq: Every day | ORAL | Status: DC
Start: 2015-10-03 — End: 2015-11-07

## 2015-10-03 MED ORDER — SIMVASTATIN 10 MG PO TABS
10.0000 mg | ORAL_TABLET | Freq: Every day | ORAL | Status: DC
Start: 2015-10-03 — End: 2015-11-07

## 2015-10-03 NOTE — Telephone Encounter (Signed)
Medication refilled per protocol. 

## 2015-10-05 ENCOUNTER — Ambulatory Visit (INDEPENDENT_AMBULATORY_CARE_PROVIDER_SITE_OTHER): Payer: BLUE CROSS/BLUE SHIELD | Admitting: *Deleted

## 2015-10-05 DIAGNOSIS — I639 Cerebral infarction, unspecified: Secondary | ICD-10-CM | POA: Diagnosis not present

## 2015-10-05 NOTE — Progress Notes (Signed)
Carelink Summary Report / Loop Recorder 

## 2015-10-14 LAB — CUP PACEART REMOTE DEVICE CHECK
MDC IDC SESS DTM: 20161121090526
MDC IDC SESS DTM: 20161221093522

## 2015-11-04 ENCOUNTER — Ambulatory Visit (INDEPENDENT_AMBULATORY_CARE_PROVIDER_SITE_OTHER): Payer: BLUE CROSS/BLUE SHIELD | Admitting: *Deleted

## 2015-11-04 DIAGNOSIS — I6350 Cerebral infarction due to unspecified occlusion or stenosis of unspecified cerebral artery: Secondary | ICD-10-CM

## 2015-11-04 DIAGNOSIS — I639 Cerebral infarction, unspecified: Secondary | ICD-10-CM

## 2015-11-04 NOTE — Progress Notes (Signed)
Carelink Summary Report / Loop Recorder 

## 2015-11-06 ENCOUNTER — Encounter: Payer: Self-pay | Admitting: Family Medicine

## 2015-11-07 ENCOUNTER — Other Ambulatory Visit: Payer: Self-pay | Admitting: Family Medicine

## 2015-11-07 MED ORDER — CLOPIDOGREL BISULFATE 75 MG PO TABS: 75.0000 mg | ORAL_TABLET | Freq: Every day | ORAL | Status: DC

## 2015-11-30 ENCOUNTER — Other Ambulatory Visit: Payer: Self-pay | Admitting: Family Medicine

## 2015-11-30 ENCOUNTER — Other Ambulatory Visit: Payer: BLUE CROSS/BLUE SHIELD

## 2015-11-30 DIAGNOSIS — E785 Hyperlipidemia, unspecified: Secondary | ICD-10-CM

## 2015-11-30 DIAGNOSIS — Z119 Encounter for screening for infectious and parasitic diseases, unspecified: Secondary | ICD-10-CM

## 2015-11-30 DIAGNOSIS — E559 Vitamin D deficiency, unspecified: Secondary | ICD-10-CM

## 2015-11-30 DIAGNOSIS — Z0000 Encounter for general adult medical examination without abnormal findings: Secondary | ICD-10-CM

## 2015-11-30 DIAGNOSIS — I6340 Cerebral infarction due to embolism of unspecified cerebral artery: Secondary | ICD-10-CM

## 2015-11-30 DIAGNOSIS — Z79899 Other long term (current) drug therapy: Secondary | ICD-10-CM

## 2015-11-30 DIAGNOSIS — Z125 Encounter for screening for malignant neoplasm of prostate: Secondary | ICD-10-CM

## 2015-11-30 LAB — CBC WITH DIFFERENTIAL/PLATELET
Basophils Absolute: 0 10*3/uL (ref 0.0–0.1)
Basophils Relative: 0 % (ref 0–1)
EOS ABS: 0.1 10*3/uL (ref 0.0–0.7)
EOS PCT: 1 % (ref 0–5)
HEMATOCRIT: 45.8 % (ref 39.0–52.0)
Hemoglobin: 15.2 g/dL (ref 13.0–17.0)
LYMPHS ABS: 2.1 10*3/uL (ref 0.7–4.0)
Lymphocytes Relative: 30 % (ref 12–46)
MCH: 28.7 pg (ref 26.0–34.0)
MCHC: 33.2 g/dL (ref 30.0–36.0)
MCV: 86.6 fL (ref 78.0–100.0)
MONOS PCT: 6 % (ref 3–12)
MPV: 8.9 fL (ref 8.6–12.4)
Monocytes Absolute: 0.4 10*3/uL (ref 0.1–1.0)
NEUTROS PCT: 63 % (ref 43–77)
Neutro Abs: 4.4 10*3/uL (ref 1.7–7.7)
PLATELETS: 241 10*3/uL (ref 150–400)
RBC: 5.29 MIL/uL (ref 4.22–5.81)
RDW: 14.5 % (ref 11.5–15.5)
WBC: 7 10*3/uL (ref 4.0–10.5)

## 2015-11-30 LAB — TSH: TSH: 2.67 mIU/L (ref 0.40–4.50)

## 2015-12-01 LAB — COMPLETE METABOLIC PANEL WITH GFR
ALT: 29 U/L (ref 9–46)
AST: 21 U/L (ref 10–35)
Albumin: 4.5 g/dL (ref 3.6–5.1)
Alkaline Phosphatase: 112 U/L (ref 40–115)
BUN: 25 mg/dL (ref 7–25)
CHLORIDE: 108 mmol/L (ref 98–110)
CO2: 25 mmol/L (ref 20–31)
Calcium: 10.2 mg/dL (ref 8.6–10.3)
Creat: 1 mg/dL (ref 0.70–1.25)
GFR, EST NON AFRICAN AMERICAN: 80 mL/min (ref >=60)
Glucose, Bld: 97 mg/dL (ref 70–99)
Potassium: 5.1 mmol/L (ref 3.5–5.3)
Sodium: 142 mmol/L (ref 135–146)
Total Bilirubin: 0.4 mg/dL (ref 0.2–1.2)
Total Protein: 7.2 g/dL (ref 6.1–8.1)

## 2015-12-01 LAB — LIPID PANEL
CHOL/HDL RATIO: 2.7 ratio (ref <=5.0)
CHOLESTEROL: 142 mg/dL (ref 125–200)
HDL: 53 mg/dL (ref >=40)
LDL Cholesterol: 80 mg/dL (ref <130)
TRIGLYCERIDES: 43 mg/dL (ref <150)
VLDL: 9 mg/dL (ref <30)

## 2015-12-01 LAB — PSA: PSA: 0.52 ng/mL (ref <=4.00)

## 2015-12-01 LAB — VITAMIN D 25 HYDROXY (VIT D DEFICIENCY, FRACTURES): Vit D, 25-Hydroxy: 73 ng/mL (ref 30–100)

## 2015-12-01 LAB — HEPATITIS C ANTIBODY: HCV AB: NEGATIVE

## 2015-12-02 ENCOUNTER — Ambulatory Visit (INDEPENDENT_AMBULATORY_CARE_PROVIDER_SITE_OTHER): Payer: BLUE CROSS/BLUE SHIELD | Admitting: Family Medicine

## 2015-12-02 ENCOUNTER — Encounter: Payer: Self-pay | Admitting: Family Medicine

## 2015-12-02 VITALS — BP 132/74 | HR 68 | Temp 98.7°F | Resp 18 | Ht 69.0 in | Wt 181.0 lb

## 2015-12-02 DIAGNOSIS — Z0000 Encounter for general adult medical examination without abnormal findings: Secondary | ICD-10-CM | POA: Diagnosis not present

## 2015-12-02 DIAGNOSIS — E785 Hyperlipidemia, unspecified: Secondary | ICD-10-CM | POA: Diagnosis not present

## 2015-12-02 MED ORDER — ZOSTER VACCINE LIVE 19400 UNT/0.65ML ~~LOC~~ SOLR: 0.6500 mL | Freq: Once | SUBCUTANEOUS | Status: DC

## 2015-12-02 NOTE — Progress Notes (Signed)
Patient ID: Justin [name], male   DOB: 11-Jun-1951, 65 y.o.   MRN: [id number]   Subjective:    Patient ID: Justin [name], male    DOB: 11/09/1950, 65 y.o.   MRN: [id number]  Patient presents for CPE  patient here for complete physical exam. His colonoscopy is up-to-date. His fasting labs are reviewed he is a normal PSA. His cholesterol looks good. He has history of cryptogenic stroke of unknown specific cause. This occurred while he was having orthopedic surgery multiple torn ligaments in his left foot. He is currently on Plavix that he has had multiple spells of hives as well as swelling of the tongue this is the newest medication he has been on. He has discussed with neurology without stopping this medication however they have not agreed with this. He still has a loop recorder and from his previous stroke was suspected 2014  Due for shingles vaccine Ortho- Dr. Marcelino Scot   Review Of Systems:  GEN- denies fatigue, fever, weight loss,weakness, recent illness HEENT- denies eye drainage, change in vision, nasal discharge, CVS- denies chest pain, palpitations RESP- denies SOB, cough, wheeze ABD- denies N/V, change in stools, abd pain GU- denies dysuria, hematuria, dribbling, incontinence MSK- denies joint pain, muscle aches, injury Neuro- denies headache, dizziness, syncope, seizure activity       Objective:    BP 132/74 mmHg  Pulse 68  Temp(Src) 98.7 F (37.1 C) (Oral)  Resp 18  Ht 5\' 9"  (1.753 m)  Wt 181 lb (82.101 kg)  BMI 26.72 kg/m2 GEN- NAD, alert and oriented x3 HEENT- PERRL, EOMI, non injected sclera, pink conjunctiva, MMM, oropharynx clear Neck- Supple, no thyromegaly CVS- RRR, no murmur RESP-CTAB ABD-NABS,soft,NT,ND GU- deferred EXT- No edema Pulses- Radial, DP- 2+        Assessment & Plan:      Problem List Items Addressed This Visit    Mild hyperlipidemia    Other Visit Diagnoses    Routine general medical examination at a health care facility    -   Primary    CPE done, shingles vaccine sent to pharmacy  fasting reviewed, cholesterol at goal. I will discuss with neurologist the plavix, concern for the reactions, see if Aggrenox would be better fit. He has tolerated ASA       Note: This dictation was prepared with Dragon dictation along with smaller phrase technology. Any transcriptional errors that result from this process are unintentional.

## 2015-12-02 NOTE — Patient Instructions (Addendum)
Shingles vaccine sent to pharmacy  I will discuss your plavix  F/U 6 months

## 2015-12-04 ENCOUNTER — Encounter: Payer: Self-pay | Admitting: Family Medicine

## 2015-12-05 ENCOUNTER — Ambulatory Visit (INDEPENDENT_AMBULATORY_CARE_PROVIDER_SITE_OTHER): Payer: BLUE CROSS/BLUE SHIELD | Admitting: *Deleted

## 2015-12-05 ENCOUNTER — Telehealth: Payer: Self-pay | Admitting: Family Medicine

## 2015-12-05 DIAGNOSIS — I639 Cerebral infarction, unspecified: Secondary | ICD-10-CM

## 2015-12-05 NOTE — Telephone Encounter (Signed)
  The patient's neurologist he has history of stroke in 2014 as well as what is being labeled as cryptogenic stroke in in 2016 after he had 2 orthopedic surgeries back to back. He has been concerned about being on the Plavix because he's had tongue swelling some strange rashes and does not feel like the medication agrees with him. I spoke with his neurology is he agrees that he can go back to full dose aspirin he will see him back in the clinic in 6 weeks with an appointment as are rescheduled for April. They will then discuss if Aggrenox is something also be better for him.  H and notify he will restart aspirin 325 mg he will discontinue the Plavix

## 2015-12-07 ENCOUNTER — Encounter: Payer: Self-pay | Admitting: Family Medicine

## 2015-12-07 NOTE — Progress Notes (Signed)
Carelink Summary Report / Loop Recorder 

## 2015-12-14 ENCOUNTER — Ambulatory Visit (INDEPENDENT_AMBULATORY_CARE_PROVIDER_SITE_OTHER): Payer: BLUE CROSS/BLUE SHIELD | Admitting: Family Medicine

## 2015-12-14 DIAGNOSIS — Z23 Encounter for immunization: Secondary | ICD-10-CM | POA: Diagnosis not present

## 2015-12-14 MED ORDER — ZOSTER VACCINE LIVE 19400 UNT/0.65ML ~~LOC~~ SOLR
0.6500 mL | Freq: Once | SUBCUTANEOUS | Status: AC
  Administered 2015-12-14: 19400 [IU] via SUBCUTANEOUS

## 2015-12-19 LAB — CUP PACEART REMOTE DEVICE CHECK: MDC IDC SESS DTM: 20170219100529

## 2015-12-19 NOTE — Progress Notes (Signed)
Carelink summary report received. Battery status OK. Normal device function. No new symptom episodes, tachy episodes, brady, or pause episodes. No new AF episodes. Monthly summary reports and ROV/PRN 

## 2015-12-22 NOTE — Progress Notes (Signed)
Carelink summary report received. Battery status OK. Normal device function. No new symptom episodes, tachy episodes, brady, or pause episodes. No new AF episodes. Monthly summary reports and ROV/PRN 

## 2015-12-23 LAB — CUP PACEART REMOTE DEVICE CHECK: MDC IDC SESS DTM: 20170120093530

## 2016-01-03 ENCOUNTER — Ambulatory Visit (INDEPENDENT_AMBULATORY_CARE_PROVIDER_SITE_OTHER): Payer: BLUE CROSS/BLUE SHIELD | Admitting: *Deleted

## 2016-01-03 DIAGNOSIS — I639 Cerebral infarction, unspecified: Secondary | ICD-10-CM | POA: Diagnosis not present

## 2016-01-04 NOTE — Progress Notes (Signed)
Carelink Summary Report / Loop Recorder 

## 2016-01-10 ENCOUNTER — Other Ambulatory Visit: Payer: Self-pay | Admitting: Family Medicine

## 2016-01-10 ENCOUNTER — Encounter: Payer: Self-pay | Admitting: Family Medicine

## 2016-01-10 MED ORDER — SIMVASTATIN 10 MG PO TABS: 10.0000 mg | ORAL_TABLET | Freq: Every day | ORAL | Status: DC

## 2016-01-24 ENCOUNTER — Ambulatory Visit (INDEPENDENT_AMBULATORY_CARE_PROVIDER_SITE_OTHER): Payer: BLUE CROSS/BLUE SHIELD | Admitting: Neurology

## 2016-01-24 ENCOUNTER — Encounter: Payer: Self-pay | Admitting: Neurology

## 2016-01-24 VITALS — BP 145/93 | HR 82 | Ht 69.0 in | Wt 182.0 lb

## 2016-01-24 DIAGNOSIS — I639 Cerebral infarction, unspecified: Secondary | ICD-10-CM

## 2016-01-24 NOTE — Progress Notes (Signed)
[facility] [address] [place]. [place] [address] [phone]       OFFICE FOLLOW-UP NOTE  Justin [name] Date of Birth:  02-06-1951 Medical Record Number:  [id number]   HPI: 36 year Caucasian male seen for first office f/u visit after  [facility] admission for stroke on 04/21/13. He presented with inability to speak and right arm numbness for 20 minutes which resolved completely after reaching the emergency room.CT head was negative but MRI showed small scattered left hemispheric small infarcts while MRA showed no large vessel stenosis. 2DEcho showed normal ejection fraction and carotid dopplers showed no large vessel stenosis.LDL was minimally elevated at 103 and was started on zocor and aspirin. TEE showed no PFo or clot. He has had loop recorder inserted which has so far not recorded any atrial fibrillation.He states he has done well since discharge without any new neurovascular symptoms.he reports minor bruising on aspirin but has not had any bleeding.He is tolerating simvastatin without any myalgias.He has h/o Bell`s palsy in past. Update 07/25/15 : He returns for follow-up after last visit 2 years ago. He is here today for follow-up for another stroke in June 2016. On 03/22/15 he fell off a ladder with subsequent left sided grade 3A open left pilon fibula/ tibia fracture and left anterior shoulder dislocation. He required on 06/09 for I & D with ORIF left pilon, tibia and Fibula as well as CR of left shoulder redislocation. CT scan of the head showed acute left PCA infarct and MRI scan of the brain showed acute left medial occipital lobe infarct with additional small infarcts inbilateral occipital lobes and scattered cortical/white matter infarcts in watershed distribution bilaterally. No significant intracranial stenosis/ atherosclerosis noted on MRA. Carotid dopplers without significant ICA stenosis. BLE dopplers negative for DVT. Cardiac echo with EF 65% with no wall abnormality.  He was on aspirin which which was changed to Plavix for second stroke prevention. Patient had a previous stroke in July 2014 which was also embolic and had loop recorder placed at that time which was interrogated and atrial fibrillation was not found. Patient was transferred to inpatient rehabilitation and made gradual improvement. He states he has noted improvement in Justin right lower quadrant field cut but has in fact been able to drive without problems for the last few weeks now. Justin ankle seems to be healing quite well. Remains on Plavix which is tolerating well. He is also on Zocor and is tolerating it well without muscle aches or pains. He has no new complaints today. Update 01/24/2016 : He returns for follow-up after last visit 6 months ago. Is accompanied by Justin [name]. He continues to do well without recurrent stroke or TIA symptoms. He continues to have right lower field vision difficulties but states he has been quite careful with Justin driving particularly while changing lanes and has not had any accidents. He developed hives as well as tongue swelling on Plavix and this was changed to aspirin which is tolerating better without bruising or bleeding. He states Justin blood pressure is quite good and usually though it is slightly elevated today at 145/93 in our office and he blames this on anxiety. He is tolerating Zocor well without any side effects. He plans to have follow-up lipid profile checked at next annual physical visit with Justin primary physician and summer. He continues to have some pain in Justin foot and has to walk slowly. He has no new complaints today. ROS:   14 system review of systems  is positive for loss of peripheral vision, left foot pain, gait difficulty only and all other systems negative  PMH:  Past Medical History  Diagnosis Date  . Bell's palsy 1980's; [id number]; 2000's; 12/2012; 03/2013    "multiple times" (04/21/2013)  . TIA (transient ischemic attack) 04/21/2013  . Hypercholesteremia   .  Dislocation of left shoulder joint 03/31/2015  . PFO (patent foramen ovale)     PFO by 04/24/13 TEE  . Stroke ([place]) 2014    No residual from 2014.  Stroke 03/2015 - Right side effective- "mopst of strenghthas come back."  . Stroke ([place]) 2016  . Vision abnormalities     right eye vision    Social History:  Social History   Social History  . Marital Status: Married    Spouse Name: N/A  . Number of Children: 3  . Years of Education: college   Occupational History  . [employer]    Social History Main Topics  . Smoking status: Never Smoker   . Smokeless tobacco: Never Used  . Alcohol Use: No     Comment: 03/22/2013 "glass of wine couple times/yr"  . Drug Use: No  . Sexual Activity: Yes   Other Topics Concern  . Not on file   Social History Narrative    Medications:   Current Outpatient Prescriptions on File Prior to Visit  Medication Sig Dispense Refill  . ACYCLOVIR PO Take 800 mg by mouth as needed (when pt bell palsy fares up). Reported on 12/02/2015    . predniSONE (DELTASONE) 10 MG tablet Take 10 mg by mouth as needed (when the bells palsy fares up). Reported on 12/02/2015    . simvastatin (ZOCOR) 10 MG tablet Take 1 tablet (10 mg total) by mouth at bedtime. 30 tablet 6   No current facility-administered medications on file prior to visit.    Allergies:   Allergies  Allergen Reactions  . Plavix [Clopidogrel] Anxiety    Nocturnal Restlessness, rash, tongue swelling  . Lyrica [Pregabalin]     Edema     Physical Exam General: well developed, well nourished, seated, in no evident distress Head: head normocephalic and atraumatic.     Neck: supple with no carotid or supraclavicular bruits Cardiovascular: regular rate and rhythm, no murmurs Musculoskeletal: no deformity Skin:  no rash/petichiae Vascular:  Normal pulses all extremities Filed Vitals:   01/24/16 1037  BP: 145/93  Pulse: 82    Neurologic Exam Mental Status: Awake and fully alert. Oriented to place  and time. Recent and remote memory intact. Attention span, concentration and fund of knowledge appropriate. Mood and affect appropriate.  Cranial Nerves: Fundoscopic exam not done. Pupils equal, briskly reactive to light. Extraocular movements full without nystagmus. Visual fields show partial right inferior quadrantanopsia to confrontation. Hearing intact. Facial sensation intact. Face, tongue, palate moves normally and symmetrically.  Motor: Normal bulk and tone. Normal strength in all tested extremity muscles. Favors left foot secondary to ankle pain Sensory.: intact to touch and pinprick and vibratory sensation.  Coordination: Rapid alternating movements normal in all extremities. Finger-to-nose and heel-to-shin performed accurately bilaterally. Gait and Station: Arises from chair without difficulty. Stance is normal. Gait is antalgic with favoring left ankle Unable to heel, toe and tandem walk without difficulty.  Reflexes: 1+ and symmetric. Toes downgoing.   NIHSS  1 Modified Rankin  1   ASSESSMENT:  65 year male with embolic left MCA branch infarcts in July 2014 off cryptogenic etiology. Vascular risk factor of hyperlipidimia only. Recent  bilateral occipital embolic infarcts in June 2016 with residual mild right lower quadrantanopsia.   PLAN: I had a long d/w patient and Justin [name] about Justin remote stroke, risk for recurrent stroke/TIAs, personally independently reviewed imaging studies and stroke evaluation results and answered questions.Continue aspirin 325 mg daily  for secondary stroke prevention and maintain strict control of hypertension with blood pressure goal below 130/90, diabetes with hemoglobin A1c goal below 6.5% and lipids with LDL cholesterol goal below 70 mg/dL. I also advised the patient to eat a healthy diet with plenty of whole grains, cereals, fruits and vegetables, exercise regularly and maintain ideal body weight. I advised him to be careful with Justin driving due to Justin  partial right-sided peripheral vision loss. Followup in the future with me in one year or call earlier if necessary   Antony Contras, MD

## 2016-01-24 NOTE — Patient Instructions (Signed)
I had a long d/w patient and his wife about his remote stroke, risk for recurrent stroke/TIAs, personally independently reviewed imaging studies and stroke evaluation results and answered questions.Continue aspirin 325 mg daily  for secondary stroke prevention and maintain strict control of hypertension with blood pressure goal below 130/90, diabetes with hemoglobin A1c goal below 6.5% and lipids with LDL cholesterol goal below 70 mg/dL. I also advised the patient to eat a healthy diet with plenty of whole grains, cereals, fruits and vegetables, exercise regularly and maintain ideal body weight. I advised him to be careful with his driving due to his partial right-sided peripheral vision loss. Followup in the future with me in one year or call earlier if necessary

## 2016-01-26 ENCOUNTER — Encounter: Payer: Self-pay | Admitting: Internal Medicine

## 2016-02-02 ENCOUNTER — Ambulatory Visit (INDEPENDENT_AMBULATORY_CARE_PROVIDER_SITE_OTHER): Payer: BLUE CROSS/BLUE SHIELD | Admitting: *Deleted

## 2016-02-02 DIAGNOSIS — Z4509 Encounter for adjustment and management of other cardiac device: Secondary | ICD-10-CM | POA: Diagnosis not present

## 2016-02-02 NOTE — Progress Notes (Signed)
Carelink Summary Report / Loop Recorder 

## 2016-02-03 ENCOUNTER — Telehealth: Payer: Self-pay | Admitting: Cardiology

## 2016-02-03 NOTE — Telephone Encounter (Signed)
LMOVM requesting that pt send manual transmission b/c home monitor has not updated in at least 14 days.    

## 2016-02-21 ENCOUNTER — Ambulatory Visit (INDEPENDENT_AMBULATORY_CARE_PROVIDER_SITE_OTHER): Payer: BLUE CROSS/BLUE SHIELD | Admitting: Internal Medicine

## 2016-02-21 ENCOUNTER — Encounter: Payer: Self-pay | Admitting: Internal Medicine

## 2016-02-21 VITALS — BP 145/103 | HR 62 | Ht 69.0 in | Wt 185.8 lb

## 2016-02-21 DIAGNOSIS — I639 Cerebral infarction, unspecified: Secondary | ICD-10-CM

## 2016-02-21 NOTE — Progress Notes (Signed)
Patient Care Team: [name], MD as PCP - General (Family Medicine)   HPI  Justin [name] is a 65 y.o. male Seen in followup for cryptogenic stroke. LINQ      The patient denies chest pain, shortness of breath, nocturnal dyspnea, orthopnea or peripheral edema.  There have been no palpitations, lightheadedness or syncope.         Past Medical History  Diagnosis Date  . Bell's palsy 1980's; [id number]; 2000's; 12/2012; 03/2013    "multiple times" (04/21/2013)  . TIA (transient ischemic attack) 04/21/2013  . Hypercholesteremia   . Dislocation of left shoulder joint 03/31/2015  . PFO (patent foramen ovale)     PFO by 04/24/13 TEE  . Stroke ([place]) 2014    No residual from 2014.  Stroke 03/2015 - Right side effective- "mopst of strenghthas come back."  . Stroke ([place]) 2016  . Vision abnormalities     right eye vision    Past Surgical History  Procedure Laterality Date  . Tee without cardioversion N/A 04/24/2013    Procedure: TRANSESOPHAGEAL ECHOCARDIOGRAM (TEE);  Surgeon: Thayer Headings, MD;  Location: [facility];  Service: Cardiovascular;  Laterality: N/A;  . Implanted loop recorder    . Colonoscopy N/A 07/13/2013    Procedure: COLONOSCOPY;  Surgeon: Danie Binder, MD;  [facility];  Service: Endoscopy;  Laterality: N/A;  8:30 AM  . Loop recorder implant N/A 04/24/2013    Procedure: LOOP RECORDER IMPLANT;  Surgeon: Deboraha Sprang, MD;  Location: [facility] CATH LAB;  Service: Cardiovascular;  Laterality: N/A;  . Shoulder closed reduction Left 03/22/2015    Procedure: CLOSED REDUCTION SHOULDER;  Surgeon: Rod Can, MD;  Location: [facility];  Service: Orthopedics;  Laterality: Left;  . External fixation leg Left 03/22/2015    Procedure: EXTERNAL FIXATION left ankle;  Surgeon: Rod Can, MD;  Location: [facility];  Service: Orthopedics;  Laterality: Left;  . I&d extremity Left 03/22/2015    Procedure: IRRIGATION AND DEBRIDEMENT OPEN TIBIA -FIBULA  FRACTURE;  Surgeon: Rod Can, MD;  Location: [facility];  Service: Orthopedics;  Laterality: Left;  . External fixation leg Left 03/24/2015    Procedure: IRRIGATION AND DEBRIDEMENT  EXTERNAL FIXATURE REVISION LEFT ANKLE ;  Surgeon: Altamese Munjor, MD;  Location: [facility];  Service: Orthopedics;  Laterality: Left;  . External fixation removal Left 05/23/2015    Procedure: REMOVAL EXTERNAL FIXATION LEFT LEG;  Surgeon: Altamese Unadilla, MD;  Location: [facility];  Service: Orthopedics;  Laterality: Left;    Current Outpatient Prescriptions  Medication Sig Dispense Refill  . ACYCLOVIR PO Take 800 mg by mouth as needed (when pt bell palsy fares up). Reported on 12/02/2015    . aspirin 325 MG tablet Take 325 mg by mouth daily.    . diphenhydrAMINE (BENADRYL) 25 MG tablet Take 25 mg by mouth every 6 (six) hours as needed.    . predniSONE (DELTASONE) 10 MG tablet Take 10 mg by mouth as needed (when the bells palsy fares up). Reported on 12/02/2015    . simvastatin (ZOCOR) 10 MG tablet Take 1 tablet (10 mg total) by mouth at bedtime. 30 tablet 6   No current facility-administered medications for this visit.    Allergies  Allergen Reactions  . Plavix [Clopidogrel] Anxiety    Nocturnal Restlessness, rash, tongue swelling  . Lyrica [Pregabalin]     Edema     Review of Systems negative except from HPI and PMH  Physical Exam  BP 145/103 mmHg  Pulse 62  Ht 5\' 9"  (1.753 m)  Wt 185 lb 12.8 oz (84.278 kg)  BMI 27.43 kg/m2 Well developed and nourished in no acute distress HENT normal Neck supple with JVP-flat Clear Regular rate and rhythm, no murmurs or gallops Abd-soft with active BS No Clubbing cyanosis edema Skin-warm and dry A & Oriented  Grossly normal sensory and motor function      Assessment and  Plan  Cryptogenic stroke  Hyperlipidemia  Hypertension  Implantable loop recorder-LINQ  No interval atrial fibrillation   His loop recorder was interrogated   Blood pressure elevated   willcheck at home  His device  is approaching end of service. We will plan to remove it some time in August

## 2016-02-21 NOTE — Patient Instructions (Signed)
Medication Instructions: - Your physician recommends that you continue on your current medications as directed. Please refer to the Current Medication list given to you today.  Labwork: - none  Procedures/Testing: - Dr. Olin Pia nurse, Nira Conn, will call you at the beginning of August to schedule a time to have your loop recorder explanted.  Follow-Up: - pending device removal  Any Additional Special Instructions Will Be Listed Below (If Applicable).     If you need a refill on your cardiac medications before your next appointment, please call your pharmacy.

## 2016-02-28 LAB — CUP PACEART INCLINIC DEVICE CHECK: Date Time Interrogation Session: 20170509190603

## 2016-03-05 ENCOUNTER — Ambulatory Visit (INDEPENDENT_AMBULATORY_CARE_PROVIDER_SITE_OTHER): Payer: BLUE CROSS/BLUE SHIELD | Admitting: *Deleted

## 2016-03-05 DIAGNOSIS — Z4509 Encounter for adjustment and management of other cardiac device: Secondary | ICD-10-CM

## 2016-03-05 NOTE — Progress Notes (Signed)
Carelink Summary Report / Loop Recorder 

## 2016-03-10 LAB — CUP PACEART REMOTE DEVICE CHECK: Date Time Interrogation Session: 20170321103523

## 2016-03-10 NOTE — Progress Notes (Signed)
Carelink summary report received. Battery status OK. Normal device function. No new symptom episodes, tachy episodes, brady, or pause episodes. No new AF episodes. Monthly summary reports and ROV/PRN 

## 2016-03-12 LAB — CUP PACEART REMOTE DEVICE CHECK: MDC IDC SESS DTM: 20170420110520

## 2016-03-12 NOTE — Progress Notes (Signed)
Carelink summary report received. Battery status OK. Normal device function. No new symptom episodes, tachy episodes, brady, or pause episodes. No new AF episodes. Monthly summary reports and ROV/PRN 

## 2016-03-22 ENCOUNTER — Other Ambulatory Visit: Payer: Self-pay | Admitting: Orthopedic Surgery

## 2016-03-22 DIAGNOSIS — S82872N Displaced pilon fracture of left tibia, subsequent encounter for open fracture type IIIA, IIIB, or IIIC with nonunion: Secondary | ICD-10-CM

## 2016-03-26 ENCOUNTER — Ambulatory Visit
Admission: RE | Admit: 2016-03-26 | Discharge: 2016-03-26 | Disposition: A | Discharge status: Home / Self Care | Payer: 59 | Source: Ambulatory Visit | Attending: Orthopedic Surgery | Admitting: Orthopedic Surgery

## 2016-03-26 DIAGNOSIS — S82872N Displaced pilon fracture of left tibia, subsequent encounter for open fracture type IIIA, IIIB, or IIIC with nonunion: Secondary | ICD-10-CM

## 2016-04-02 ENCOUNTER — Ambulatory Visit (INDEPENDENT_AMBULATORY_CARE_PROVIDER_SITE_OTHER): Payer: 59 | Admitting: *Deleted

## 2016-04-02 DIAGNOSIS — Z4509 Encounter for adjustment and management of other cardiac device: Secondary | ICD-10-CM | POA: Diagnosis not present

## 2016-04-02 NOTE — Progress Notes (Signed)
Carelink Summary Report / Loop Recorder 

## 2016-04-14 LAB — CUP PACEART REMOTE DEVICE CHECK
MDC IDC SESS DTM: 20170520110542
MDC IDC SESS DTM: 20170619110520

## 2016-05-02 ENCOUNTER — Ambulatory Visit (INDEPENDENT_AMBULATORY_CARE_PROVIDER_SITE_OTHER): Payer: 59 | Admitting: *Deleted

## 2016-05-02 DIAGNOSIS — Z4509 Encounter for adjustment and management of other cardiac device: Secondary | ICD-10-CM

## 2016-05-02 NOTE — Progress Notes (Signed)
Carelink Summary Report / Loop Recorder 

## 2016-05-15 ENCOUNTER — Telehealth: Payer: Self-pay | Admitting: Neurology

## 2016-05-15 NOTE — Telephone Encounter (Signed)
Gwen/Dr. Marcelino Scot [facility] [phone] called to request medical clearance for patient having surgery August 15th.

## 2016-05-15 NOTE — Telephone Encounter (Signed)
Rn call Gwen back about the clearance letter for surgery. Rn stated patient does not take any blood thinners only aspirin. Gwen stated they just need a clearance letter because he had a stroke in the past. LEtter can be fax to [phone] attention Gwen.

## 2016-05-16 ENCOUNTER — Telehealth: Payer: Self-pay | Admitting: Cardiology

## 2016-05-16 NOTE — Telephone Encounter (Signed)
Clearance from fax to Dr. Marcelino Scot office at [phone]. LEtter fax and receive twice.

## 2016-05-16 NOTE — Telephone Encounter (Signed)
LMOVM requesting that pt send manual transmission b/c home monitor has not updated in at least 14 days.    

## 2016-05-24 ENCOUNTER — Encounter (HOSPITAL_COMMUNITY): Payer: Self-pay

## 2016-05-24 NOTE — Pre-Procedure Instructions (Signed)
Ingrid Yanik Civello  05/24/2016     Your procedure is scheduled on : Tuesday May 29, 2016 at 8:00 AM.   Report to [facility] Admitting at 6:00 AM.  Call this number if you have problems the morning of surgery: [phone]    Remember:  Do not eat food or drink liquids after midnight.  Take these medicines the morning of surgery with A SIP OF WATER : Acyclovir if needed, Prednisone if needed   Stop taking any vitamins, herbal medications/supplements, NSAIDs, Ibuprofen, Advil, Motrin, Aleve, etc today   Do not wear jewelry.  Do not wear lotions, powders, or cologne.    Men may shave face and neck.  Do not bring valuables to the hospital.  [facility] is not responsible for any belongings or valuables.  Contacts, dentures or bridgework may not be worn into surgery.  Leave your suitcase in the car.  After surgery it may be brought to your room.  For patients admitted to the hospital, discharge time will be determined by your treatment team.  Patients discharged the day of surgery will not be allowed to drive home.   Name and phone number of your driver:    Special instructions:  Shower using CHG soap the night before and the morning of your surgery  Please read over the following fact sheets that you were given. MRSA Information and Surgical Site Infection Prevention

## 2016-05-25 ENCOUNTER — Encounter (HOSPITAL_COMMUNITY)
Admission: RE | Admit: 2016-05-25 | Discharge: 2016-05-25 | Disposition: A | Discharge status: Home / Self Care | Payer: 59 | Source: Ambulatory Visit | Attending: Orthopedic Surgery | Admitting: Orthopedic Surgery

## 2016-05-25 ENCOUNTER — Encounter (HOSPITAL_COMMUNITY): Payer: Self-pay

## 2016-05-25 DIAGNOSIS — X58XXXD Exposure to other specified factors, subsequent encounter: Secondary | ICD-10-CM | POA: Insufficient documentation

## 2016-05-25 DIAGNOSIS — Z7982 Long term (current) use of aspirin: Secondary | ICD-10-CM | POA: Diagnosis not present

## 2016-05-25 DIAGNOSIS — Z79899 Other long term (current) drug therapy: Secondary | ICD-10-CM | POA: Insufficient documentation

## 2016-05-25 DIAGNOSIS — Z95818 Presence of other cardiac implants and grafts: Secondary | ICD-10-CM | POA: Insufficient documentation

## 2016-05-25 DIAGNOSIS — Z8673 Personal history of transient ischemic attack (TIA), and cerebral infarction without residual deficits: Secondary | ICD-10-CM | POA: Insufficient documentation

## 2016-05-25 DIAGNOSIS — Q211 Atrial septal defect: Secondary | ICD-10-CM | POA: Insufficient documentation

## 2016-05-25 DIAGNOSIS — Z01818 Encounter for other preprocedural examination: Secondary | ICD-10-CM | POA: Insufficient documentation

## 2016-05-25 DIAGNOSIS — Z01812 Encounter for preprocedural laboratory examination: Secondary | ICD-10-CM | POA: Insufficient documentation

## 2016-05-25 DIAGNOSIS — E7800 Pure hypercholesterolemia, unspecified: Secondary | ICD-10-CM | POA: Diagnosis not present

## 2016-05-25 DIAGNOSIS — S82302K Unspecified fracture of lower end of left tibia, subsequent encounter for closed fracture with nonunion: Secondary | ICD-10-CM | POA: Insufficient documentation

## 2016-05-25 LAB — CBC WITH DIFFERENTIAL/PLATELET
BASOS ABS: 0.1 10*3/uL (ref 0.0–0.1)
Basophils Relative: 1 %
EOS PCT: 1 %
Eosinophils Absolute: 0.1 10*3/uL (ref 0.0–0.7)
HCT: 46.1 % (ref 39.0–52.0)
Hemoglobin: 15.2 g/dL (ref 13.0–17.0)
LYMPHS PCT: 36 %
Lymphs Abs: 2.4 10*3/uL (ref 0.7–4.0)
MCH: 28.2 pg (ref 26.0–34.0)
MCHC: 33 g/dL (ref 30.0–36.0)
MCV: 85.5 fL (ref 78.0–100.0)
MONO ABS: 0.4 10*3/uL (ref 0.1–1.0)
Monocytes Relative: 6 %
Neutro Abs: 3.7 10*3/uL (ref 1.7–7.7)
Neutrophils Relative %: 56 %
PLATELETS: 221 10*3/uL (ref 150–400)
RBC: 5.39 MIL/uL (ref 4.22–5.81)
RDW: 14.2 % (ref 11.5–15.5)
WBC: 6.6 10*3/uL (ref 4.0–10.5)

## 2016-05-25 LAB — COMPREHENSIVE METABOLIC PANEL
ALT: 24 U/L (ref 17–63)
ANION GAP: 11 (ref 5–15)
AST: 24 U/L (ref 15–41)
Albumin: 4.3 g/dL (ref 3.5–5.0)
Alkaline Phosphatase: 91 U/L (ref 38–126)
BUN: 20 mg/dL (ref 6–20)
CHLORIDE: 108 mmol/L (ref 101–111)
CO2: 20 mmol/L — ABNORMAL LOW (ref 22–32)
Calcium: 9.7 mg/dL (ref 8.9–10.3)
Creatinine, Ser: 0.92 mg/dL (ref 0.61–1.24)
Glucose, Bld: 95 mg/dL (ref 65–99)
POTASSIUM: 3.8 mmol/L (ref 3.5–5.1)
Sodium: 139 mmol/L (ref 135–145)
Total Bilirubin: 0.6 mg/dL (ref 0.3–1.2)
Total Protein: 7.3 g/dL (ref 6.5–8.1)

## 2016-05-25 LAB — URINALYSIS, ROUTINE W REFLEX MICROSCOPIC
BILIRUBIN URINE: NEGATIVE
Glucose, UA: NEGATIVE mg/dL
Hgb urine dipstick: NEGATIVE
Ketones, ur: NEGATIVE mg/dL
LEUKOCYTES UA: NEGATIVE
NITRITE: NEGATIVE
PH: 5.5 (ref 5.0–8.0)
Protein, ur: NEGATIVE mg/dL
SPECIFIC GRAVITY, URINE: 1.027 (ref 1.005–1.030)

## 2016-05-25 LAB — PROTIME-INR
INR: 1.02
PROTHROMBIN TIME: 13.4 s (ref 11.4–15.2)

## 2016-05-25 LAB — CUP PACEART REMOTE DEVICE CHECK: Date Time Interrogation Session: 20170719120530

## 2016-05-25 LAB — TSH: TSH: 1.573 u[IU]/mL (ref 0.350–4.500)

## 2016-05-25 LAB — MAGNESIUM: MAGNESIUM: 2.1 mg/dL (ref 1.7–2.4)

## 2016-05-25 LAB — PHOSPHORUS: Phosphorus: 3.5 mg/dL (ref 2.5–4.6)

## 2016-05-25 LAB — APTT: APTT: 33 s (ref 24–36)

## 2016-05-25 NOTE — Progress Notes (Signed)
PCP is [facility]  Electrophysiologist is Virl Axe. Patient has a loop recorder that was placed after patient had a CVA in 2014.  Neurologist is Dr. Leonie Man  Patient denied having any acute cardiac or pulmonary issues.   During PAT visit patient stated that Dr. Marcelino Scot was supposed to get together with Dr. Caryl Comes to see if Dr. Caryl Comes could remove patients loop recorder during surgery. Nurse informed patient that there were no orders in EPIC regarding the removal of the loop recorder. Patient stated he would contact Dr. Marcelino Scot about it.

## 2016-05-26 LAB — VITAMIN D 25 HYDROXY (VIT D DEFICIENCY, FRACTURES): VIT D 25 HYDROXY: 51.6 ng/mL (ref 30.0–100.0)

## 2016-05-26 LAB — PTH, INTACT AND CALCIUM
Calcium, Total (PTH): 9.7 mg/dL (ref 8.6–10.2)
PTH: 71 pg/mL — AB (ref 15–65)

## 2016-05-28 ENCOUNTER — Telehealth: Payer: Self-pay | Admitting: Internal Medicine

## 2016-05-28 NOTE — Telephone Encounter (Signed)
Returning pt call from 05/25/16. Called pt to discuss loop extraction.  Pt informed me that he will be admitted into the hospital 8/15 for surgery and will be unavailable until early next week. I instructed the pt that I would call again 8/21 to schedule extraction for sept.  Pt was agreeable to current therapy plan.

## 2016-05-28 NOTE — Telephone Encounter (Signed)
Pt states he received a message on Friday at 5 to 5 to discuss removing loop recorder and to call back on Monday (today). Pt states there was no other information on this message, pt states he had some discussion with Dr Caryl Comes in the past about removing the loop recorder in August.  Pt advised I do not see any notes about this, I will forward to Dr Klein/Heather/ [facility] for review.

## 2016-05-28 NOTE — Progress Notes (Signed)
Anesthesia chart review: Patient is a 65 year old male scheduled for repair of left distal tibia nonunion, iliac crest bone graft harvest on 05/29/2016 by Dr. Marcelino Scot.  History includes nonsmoker, hypercholesterolemia, CVA 04/21/13 (left cortical parietal infarct) and 03/24/15 (left PCA territory infarct), PFO by 04/2013 TEE, recurrent Bell's palsy, s/p [employer] LINQ Reveal Loop Recorder 04/24/13 (Dr. Caryl Comes). He fell from a ladder on 03/22/15 and sustained left tib-fib fracture (s/p spanning external fixator; ORIF 03/24/15) and left shoulder dislocation (s/p reduction). PCP is Dr. [name].   Neurologist is Dr. Leonie Man, last visit 01/24/16. He cleared patient for surgery from a neurologic standpoint with permission to hold ASA 3 days prior to and resume after surgery with a "small, but acceptable peri-procedure risk of TIA-Stroke." (See Letters tab.) Of note, on 03/16/15, Dr. Leonie Man spoke with patient about possible consideration of the Amplatzer PFO closure device in the future (following recovery from orthopedic surgeries), although no formal FDA approval "but perhaps the patient may qualify for compassionate use."   Last EP visit with Dr. Virl Axe on 02/21/16. Per 05/02/16 loop recorder transmission: No new observations. Device is approaching end of service, so Dr. Caryl Comes mentioned planning to remove around August 2017.  Meds include acyclovir PRN, aspirin, Benadryl PRN, prednisone PRN (Bell's Palsy flares), Zocor. (Plavix was discontinued previously due to tongue swelling.)  BP (!) 144/99   Pulse 67   Temp 36.8 C (Oral)   Resp 18   Ht 5\' 9"  (1.753 m)   Wt 186 lb 2 oz (84.4 kg)   SpO2 96%   BMI 27.49 kg/m   02/21/16 EKG: NSR.   03/25/15 Echo (TTE): Study Conclusions - Left ventricle: The cavity size was normal. Wall thickness was   increased in a pattern of mild LVH. The estimated ejection   fraction was 65%. Wall motion was normal; there were no regional   wall motion abnormalities. - Right  ventricle: The cavity size was mildly decreased. Systolic   function was mildly reduced. - Pulmonary arteries: PA peak pressure: 43 mm Hg (S). - Pericardium, extracardiac: A trivial pericardial effusion was   identified posterior to the heart. - Impressions: No cardiac source of embolism was identified, but   cannot be ruled out on the basis of this examination.  04/24/13 TEE: Study Conclusions - Left ventricle: Systolic function was normal. The estimated ejection fraction was in the range of 60% to65%. - Aortic valve: No evidence of vegetation. - Mitral valve: No evidence of vegetation. - Left atrium: No evidence of thrombus in the atrial cavityor appendage. - Atrial septum: There is right to left flow across a PFO by bubble contrast. There was a patent foramen ovale. - Plan: Discussed with Dr Leonie Man.  He will order venous dopplers to look for DVT.  If there is no evidence of DVT [negative BLE DVT 04/24/13], he would like to have the loop recorder placed. (Dr. Mertie Moores)   03/25/15 Carotid U/S: Summary: - The vertebral arteries appear patent with antegrade flow. - Findings consistent with 1-39 percent stenosis involving the   right internal carotid artery and the left internal carotid   artery.  Preoperative labs noted. PTH is mildly elevated at 71.   Patient was cleared by his neurologist. He does have a PFO, so special attention to avoid air in lines is needed. Of note, patient mentioned in PAT that since Dr. Caryl Comes had mentioned explanting loop recorder this month that he thought Dr. Marcelino Scot was going to get in touch with Dr. Caryl Comes  to see if both procedures could be done on the same day. Patient was told at PAT that there were no orders reflecting that his loop recorder would be explanted tomorrow. Patient was going to follow-up with Dr. Caryl Comes regarding timing. I also inquired of Dr. Marcelino Scot who said at this point there were only plans to do the orthopedic procedure tomorrow.  Orlan Hugh [facility]/Anesthesiology Phone [phone] 05/28/2016 9:57 AM

## 2016-05-28 NOTE — H&P (Signed)
Justin [name] is an 65 y.o. male.      Orthopaedic Trauma Service H&P  Chief Complaint: L distal tibia nonunion s/p Open L distal tibia fracture HPI:   65 y/o male s/p open L distal tibia fracture (03/2015) complicated by CVA. Treated definitively in ex fix. Pt has had increasing pain in L ankle since ex fix was removed. 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 Pt presents for repair of nonunion with ICBG and plating.   Clearance has been obtained from neurology. Will resume ASA post op and place on lovenox as well.   Past Medical History:  Diagnosis Date  . Bell's palsy 1980's; 1990's; 2000's; 12/2012; 03/2013   "multiple times" (04/21/2013)  . Dislocation of left shoulder joint 03/31/2015  . Hypercholesteremia   . PFO (patent foramen ovale)    PFO by 04/24/13 TEE  . Stroke (HCC) 2014   No residual from 2014.  Stroke 03/2015 - Right side effective- "mopst of strenghthas come back."  . Stroke (HCC) 2016  . TIA (transient ischemic attack) 04/21/2013  . Vision abnormalities    right eye vision    Past Surgical History:  Procedure Laterality Date  . COLONOSCOPY N/A 07/13/2013   Procedure: COLONOSCOPY;  Surgeon: Sandi L Fields, MD;  Location: [facility];  Service: Endoscopy;  Laterality: N/A;  8:30 AM  . EXTERNAL FIXATION LEG Left 03/22/2015   Procedure: EXTERNAL FIXATION left ankle;  Surgeon: Brian Swinteck, MD;  Location: [facility] OR;  Service: Orthopedics;  Laterality: Left;  . EXTERNAL FIXATION LEG Left 03/24/2015   Procedure: IRRIGATION AND DEBRIDEMENT  EXTERNAL FIXATURE REVISION LEFT ANKLE ;  Surgeon: Michael Handy, MD;  Location: [facility] OR;  Service: Orthopedics;  Laterality: Left;  . EXTERNAL FIXATION REMOVAL Left 05/23/2015   Procedure: REMOVAL EXTERNAL FIXATION LEFT LEG;  Surgeon: Michael Handy, MD;  Location: [facility] OR;  Service: Orthopedics;  Laterality: Left;  . I&D EXTREMITY Left 03/22/2015   Procedure: IRRIGATION AND DEBRIDEMENT OPEN TIBIA -FIBULA  FRACTURE;   Surgeon: Brian Swinteck, MD;  Location: [facility];  Service: Orthopedics;  Laterality: Left;  . implanted loop recorder    . LOOP RECORDER IMPLANT N/A 04/24/2013   Procedure: LOOP RECORDER IMPLANT;  Surgeon: Steven C Klein, MD;  Location: [facility];  Service: Cardiovascular;  Laterality: N/A;  . SHOULDER CLOSED REDUCTION Left 03/22/2015   Procedure: CLOSED REDUCTION SHOULDER;  Surgeon: Brian Swinteck, MD;  Location: [facility];  Service: Orthopedics;  Laterality: Left;  . TEE WITHOUT CARDIOVERSION N/A 04/24/2013   Procedure: TRANSESOPHAGEAL ECHOCARDIOGRAM (TEE);  Surgeon: Philip J Nahser, MD;  Location: [facility];  Service: Cardiovascular;  Laterality: N/A;    Family History  Problem Relation Age of Onset  . Heart disease Mother   . Alcohol abuse Mother   . Colon cancer Neg Hx    Social History:  reports that he has never smoked. He has never used smokeless tobacco. He reports that he does not drink alcohol or use drugs.  Allergies:  Allergies  Allergen Reactions  . Plavix [Clopidogrel] Swelling and Anxiety    Nocturnal Restlessness, rash TONGUE SWELLING  . Lyrica [Pregabalin] Rash    Edema EDEMA REACTION UNSPECIFIED      No prescriptions prior to admission.    No results found for this or any previous visit (from the past 48 hour(s)). No results found.  Review of Systems  Constitutional: Negative for chills and fever.  Respiratory: Negative for shortness of breath.   Cardiovascular:   Negative for chest pain and palpitations.  Gastrointestinal: Negative for abdominal pain, nausea and vomiting.  Neurological: Negative for tingling, sensory change and headaches.   Labs Results for SHAWNTA, [name] (MRN [id number]) as of 05/28/2016 14:18  Ref. Range 05/25/2016 13:23 05/25/2016 13:38  Sodium Latest Ref Range: 135 - 145 mmol/L  139  Potassium Latest Ref Range: 3.5 - 5.1 mmol/L  3.8  Chloride Latest Ref Range: 101 - 111 mmol/L  108  CO2 Latest Ref Range: 22 - 32 mmol/L  20 (L)  BUN  Latest Ref Range: 6 - 20 mg/dL  20  Creatinine Latest Ref Range: 0.61 - 1.24 mg/dL  0.92  Calcium Latest Ref Range: 8.9 - 10.3 mg/dL  9.7  EGFR (Non-African Amer.) Latest Ref Range: >60 mL/min  >60  EGFR (African American) Latest Ref Range: >60 mL/min  >60  Glucose Latest Ref Range: 65 - 99 mg/dL  95  Anion gap Latest Ref Range: 5 - 15   11  Calcium, Total (PTH) Latest Ref Range: 8.6 - 10.2 mg/dL  9.7  Phosphorus Latest Ref Range: 2.5 - 4.6 mg/dL  3.5  Magnesium Latest Ref Range: 1.7 - 2.4 mg/dL  2.1  Alkaline Phosphatase Latest Ref Range: 38 - 126 U/L  91  Albumin Latest Ref Range: 3.5 - 5.0 g/dL  4.3  AST Latest Ref Range: 15 - 41 U/L  24  ALT Latest Ref Range: 17 - 63 U/L  24  Total Protein Latest Ref Range: 6.5 - 8.1 g/dL  7.3  Total Bilirubin Latest Ref Range: 0.3 - 1.2 mg/dL  0.6  Vitamin D, 25-Hydroxy Latest Ref Range: 30.0 - 100.0 ng/mL  51.6  WBC Latest Ref Range: 4.0 - 10.5 K/uL  6.6  RBC Latest Ref Range: 4.22 - 5.81 MIL/uL  5.39  Hemoglobin Latest Ref Range: 13.0 - 17.0 g/dL  15.2  HCT Latest Ref Range: 39.0 - 52.0 %  46.1  MCV Latest Ref Range: 78.0 - 100.0 fL  85.5  MCH Latest Ref Range: 26.0 - 34.0 pg  28.2  MCHC Latest Ref Range: 30.0 - 36.0 g/dL  33.0  RDW Latest Ref Range: 11.5 - 15.5 %  14.2  Platelets Latest Ref Range: 150 - 400 K/uL  221  Neutrophils Latest Units: %  56  Lymphocytes Latest Units: %  36  Monocytes Relative Latest Units: %  6  Eosinophil Latest Units: %  1  Basophil Latest Units: %  1  NEUT# Latest Ref Range: 1.7 - 7.7 K/uL  3.7  Lymphocyte # Latest Ref Range: 0.7 - 4.0 K/uL  2.4  Monocyte # Latest Ref Range: 0.1 - 1.0 K/uL  0.4  Eosinophils Absolute Latest Ref Range: 0.0 - 0.7 K/uL  0.1  Basophils Absolute Latest Ref Range: 0.0 - 0.1 K/uL  0.1  Prothrombin Time Latest Ref Range: 11.4 - 15.2 seconds  13.4  INR Unknown  1.02  APTT Latest Ref Range: 24 - 36 seconds  33  PTH Latest Ref Range: 15 - 65 pg/mL  71 (H)  TSH Latest Ref Range: 0.350 -  4.500 uIU/mL  1.573  Appearance Latest Ref Range: CLEAR  CLEAR   Bilirubin Urine Latest Ref Range: NEGATIVE  NEGATIVE   Color, Urine Latest Ref Range: YELLOW  YELLOW   Glucose Latest Ref Range: NEGATIVE mg/dL NEGATIVE   Hgb urine dipstick Latest Ref Range: NEGATIVE  NEGATIVE   Ketones, ur Latest Ref Range: NEGATIVE mg/dL NEGATIVE   Leukocytes, UA Latest Ref Range: NEGATIVE  NEGATIVE  Nitrite Latest Ref Range: NEGATIVE  NEGATIVE   pH Latest Ref Range: 5.0 - 8.0  5.5   Protein Latest Ref Range: NEGATIVE mg/dL NEGATIVE   Specific Gravity, Urine Latest Ref Range: 1.005 - 1.030  1.027      Vitals on arrival to short stay  Physical Exam  Constitutional: He appears well-developed and well-nourished. He is active and cooperative. No distress.  HENT:  Head: Normocephalic and atraumatic.  Eyes: EOM are normal. Pupils are equal, round, and reactive to light.  Cardiovascular: Normal rate and regular rhythm.   Respiratory: Effort normal and breath sounds normal. No respiratory distress. He has no wheezes.  GI: Soft. Bowel sounds are normal. He exhibits no distension. There is no tenderness.  Musculoskeletal:  Left Lower Extremity    Traumatic wound looks good   Patch of persistent numbness proximal to traumatic wound   Soft tissue envelope is stable and looks good   Skin freely mobile   Distal motor and sensory functions intact   Ext warm    + DP pulse    No DCT    Compartments soft    DPN, SPN, TN sensation intact   EHL, FHL, AT, PT, peroneals, gastroc motor intact     TTP over medial distal tibia    nontender over fibula   Neurological: He is alert.  Skin: Skin is warm, dry and intact.  Psychiatric: He has a normal mood and affect. His speech is normal and behavior is normal. Cognition and memory are normal.     Assessment/Plan  65 y/o male with L distal tibia nonunion   OR for repair of L distal tibia- ICBG and plating Admit post op for observation and pain control Resume ASA  post op Pt understands CV risks given previous episodes, he wishes to proceed  NWB x 6 weeks post op   PAUL,KEITH W, PA-C 05/28/2016, 2:11 PM   

## 2016-05-28 NOTE — Telephone Encounter (Signed)
Follow Up:     Returning call from [place] does not know who called, He said it was 5 minutes to 5,when he received the call.

## 2016-05-29 ENCOUNTER — Observation Stay (HOSPITAL_COMMUNITY)
Admission: RE | Admit: 2016-05-29 | Discharge: 2016-05-30 | Disposition: A | Discharge status: Home / Self Care | Payer: 59 | Source: Ambulatory Visit | Attending: Orthopedic Surgery | Admitting: Orthopedic Surgery

## 2016-05-29 ENCOUNTER — Observation Stay (HOSPITAL_COMMUNITY): Payer: 59

## 2016-05-29 ENCOUNTER — Inpatient Hospital Stay (HOSPITAL_COMMUNITY): Payer: 59 | Admitting: Vascular Surgery

## 2016-05-29 ENCOUNTER — Telehealth: Payer: Self-pay

## 2016-05-29 ENCOUNTER — Inpatient Hospital Stay (HOSPITAL_COMMUNITY): Payer: 59 | Admitting: Certified Registered Nurse Anesthetist

## 2016-05-29 ENCOUNTER — Encounter (HOSPITAL_COMMUNITY)
Admission: RE | Disposition: A | Discharge status: Home / Self Care | Payer: Self-pay | Source: Ambulatory Visit | Attending: Orthopedic Surgery

## 2016-05-29 ENCOUNTER — Encounter (HOSPITAL_COMMUNITY): Payer: Self-pay | Admitting: Certified Registered Nurse Anesthetist

## 2016-05-29 DIAGNOSIS — S82302N Unspecified fracture of lower end of left tibia, subsequent encounter for open fracture type IIIA, IIIB, or IIIC with nonunion: Secondary | ICD-10-CM

## 2016-05-29 DIAGNOSIS — Z9889 Other specified postprocedural states: Secondary | ICD-10-CM | POA: Insufficient documentation

## 2016-05-29 DIAGNOSIS — S82302K Unspecified fracture of lower end of left tibia, subsequent encounter for closed fracture with nonunion: Principal | ICD-10-CM | POA: Insufficient documentation

## 2016-05-29 DIAGNOSIS — G510 Bell's palsy: Secondary | ICD-10-CM

## 2016-05-29 DIAGNOSIS — S82309B Unspecified fracture of lower end of unspecified tibia, initial encounter for open fracture type I or II: Secondary | ICD-10-CM | POA: Insufficient documentation

## 2016-05-29 DIAGNOSIS — E785 Hyperlipidemia, unspecified: Secondary | ICD-10-CM | POA: Diagnosis present

## 2016-05-29 DIAGNOSIS — E7800 Pure hypercholesterolemia, unspecified: Secondary | ICD-10-CM | POA: Diagnosis not present

## 2016-05-29 DIAGNOSIS — Q211 Atrial septal defect: Secondary | ICD-10-CM

## 2016-05-29 DIAGNOSIS — X58XXXD Exposure to other specified factors, subsequent encounter: Secondary | ICD-10-CM | POA: Insufficient documentation

## 2016-05-29 DIAGNOSIS — Z8673 Personal history of transient ischemic attack (TIA), and cerebral infarction without residual deficits: Secondary | ICD-10-CM | POA: Insufficient documentation

## 2016-05-29 DIAGNOSIS — Z419 Encounter for procedure for purposes other than remedying health state, unspecified: Secondary | ICD-10-CM

## 2016-05-29 DIAGNOSIS — Q2112 Patent foramen ovale: Secondary | ICD-10-CM

## 2016-05-29 DIAGNOSIS — S82302M Unspecified fracture of lower end of left tibia, subsequent encounter for open fracture type I or II with nonunion: Secondary | ICD-10-CM | POA: Diagnosis present

## 2016-05-29 DIAGNOSIS — Z7901 Long term (current) use of anticoagulants: Secondary | ICD-10-CM

## 2016-05-29 HISTORY — PX: ORIF TIBIA FRACTURE: SHX5416

## 2016-05-29 HISTORY — DX: Long term (current) use of anticoagulants: Z79.01

## 2016-05-29 HISTORY — DX: Unspecified fracture of lower end of left tibia, subsequent encounter for open fracture type I or II with nonunion: S82.302M

## 2016-05-29 LAB — CBC
HEMATOCRIT: 43.1 % (ref 39.0–52.0)
HEMOGLOBIN: 14 g/dL (ref 13.0–17.0)
MCH: 27.9 pg (ref 26.0–34.0)
MCHC: 32.5 g/dL (ref 30.0–36.0)
MCV: 86 fL (ref 78.0–100.0)
Platelets: 217 10*3/uL (ref 150–400)
RBC: 5.01 MIL/uL (ref 4.22–5.81)
RDW: 14.1 % (ref 11.5–15.5)
WBC: 11.4 10*3/uL — ABNORMAL HIGH (ref 4.0–10.5)

## 2016-05-29 LAB — CREATININE, SERUM
CREATININE: 0.88 mg/dL (ref 0.61–1.24)
GFR calc Af Amer: >60 mL/min (ref >60)
GFR calc non Af Amer: >60 mL/min (ref >60)

## 2016-05-29 SURGERY — OPEN REDUCTION INTERNAL FIXATION (ORIF) TIBIA FRACTURE
Anesthesia: Regional | Laterality: Left

## 2016-05-29 MED ORDER — METHOCARBAMOL 500 MG PO TABS
ORAL_TABLET | ORAL | Status: AC
  Filled 2016-05-29: qty 1

## 2016-05-29 MED ORDER — FENTANYL CITRATE (PF) 100 MCG/2ML IJ SOLN
INTRAMUSCULAR | Status: AC
  Administered 2016-05-29: 50 ug via INTRAVENOUS
  Filled 2016-05-29: qty 2

## 2016-05-29 MED ORDER — PROMETHAZINE HCL 25 MG/ML IJ SOLN: 6.2500 mg | INTRAMUSCULAR | Status: DC | PRN

## 2016-05-29 MED ORDER — PROPOFOL 10 MG/ML IV BOLUS
INTRAVENOUS | Status: AC
  Filled 2016-05-29: qty 20

## 2016-05-29 MED ORDER — ONDANSETRON HCL 4 MG/2ML IJ SOLN
INTRAMUSCULAR | Status: DC | PRN
  Administered 2016-05-29: 4 mg via INTRAVENOUS

## 2016-05-29 MED ORDER — MIDAZOLAM HCL 2 MG/2ML IJ SOLN
INTRAMUSCULAR | Status: AC
  Filled 2016-05-29: qty 2

## 2016-05-29 MED ORDER — FENTANYL CITRATE (PF) 250 MCG/5ML IJ SOLN
INTRAMUSCULAR | Status: AC
  Filled 2016-05-29: qty 5

## 2016-05-29 MED ORDER — BISACODYL 5 MG PO TBEC: 5.0000 mg | DELAYED_RELEASE_TABLET | Freq: Every day | ORAL | Status: DC | PRN

## 2016-05-29 MED ORDER — EPHEDRINE SULFATE 50 MG/ML IJ SOLN
INTRAMUSCULAR | Status: AC
  Filled 2016-05-29: qty 1

## 2016-05-29 MED ORDER — OXYCODONE-ACETAMINOPHEN 5-325 MG PO TABS
1.0000 | ORAL_TABLET | Freq: Four times a day (QID) | ORAL | Status: DC | PRN
  Administered 2016-05-29: 2 via ORAL
  Filled 2016-05-29: qty 2

## 2016-05-29 MED ORDER — SUGAMMADEX SODIUM 200 MG/2ML IV SOLN
INTRAVENOUS | Status: AC
  Filled 2016-05-29: qty 2

## 2016-05-29 MED ORDER — ONDANSETRON HCL 4 MG/2ML IJ SOLN: 4.0000 mg | Freq: Four times a day (QID) | INTRAMUSCULAR | Status: DC | PRN

## 2016-05-29 MED ORDER — 0.9 % SODIUM CHLORIDE (POUR BTL) OPTIME
TOPICAL | Status: DC | PRN
  Administered 2016-05-29: 1000 mL

## 2016-05-29 MED ORDER — PHENYLEPHRINE 40 MCG/ML (10ML) SYRINGE FOR IV PUSH (FOR BLOOD PRESSURE SUPPORT)
PREFILLED_SYRINGE | INTRAVENOUS | Status: AC
  Filled 2016-05-29: qty 10

## 2016-05-29 MED ORDER — BUPIVACAINE LIPOSOME 1.3 % IJ SUSP
20.0000 mL | INTRAMUSCULAR | Status: DC
  Filled 2016-05-29: qty 20

## 2016-05-29 MED ORDER — EPHEDRINE SULFATE 50 MG/ML IJ SOLN
INTRAMUSCULAR | Status: DC | PRN
  Administered 2016-05-29 (×2): 5 mg via INTRAVENOUS

## 2016-05-29 MED ORDER — POLYETHYLENE GLYCOL 3350 17 G PO PACK
17.0000 g | PACK | Freq: Every day | ORAL | Status: DC
  Administered 2016-05-29 – 2016-05-30 (×2): 17 g via ORAL
  Filled 2016-05-29 (×2): qty 1

## 2016-05-29 MED ORDER — ONDANSETRON HCL 4 MG/2ML IJ SOLN
INTRAMUSCULAR | Status: AC
  Filled 2016-05-29: qty 2

## 2016-05-29 MED ORDER — ACETAMINOPHEN 325 MG PO TABS: 650.0000 mg | ORAL_TABLET | Freq: Four times a day (QID) | ORAL | Status: DC | PRN

## 2016-05-29 MED ORDER — CEFAZOLIN IN D5W 1 GM/50ML IV SOLN
1.0000 g | Freq: Four times a day (QID) | INTRAVENOUS | Status: AC
  Administered 2016-05-29 – 2016-05-30 (×3): 1 g via INTRAVENOUS
  Filled 2016-05-29 (×3): qty 50

## 2016-05-29 MED ORDER — METHOCARBAMOL 1000 MG/10ML IJ SOLN
1000.0000 mg | Freq: Four times a day (QID) | INTRAVENOUS | Status: DC | PRN
  Filled 2016-05-29: qty 10

## 2016-05-29 MED ORDER — LACTATED RINGERS IV SOLN
INTRAVENOUS | Status: DC | PRN
  Administered 2016-05-29 (×2): via INTRAVENOUS

## 2016-05-29 MED ORDER — SIMVASTATIN 10 MG PO TABS
10.0000 mg | ORAL_TABLET | Freq: Every day | ORAL | Status: DC
  Administered 2016-05-29: 10 mg via ORAL
  Filled 2016-05-29: qty 1

## 2016-05-29 MED ORDER — SUGAMMADEX SODIUM 200 MG/2ML IV SOLN
INTRAVENOUS | Status: DC | PRN
  Administered 2016-05-29: 200 mg via INTRAVENOUS

## 2016-05-29 MED ORDER — METOCLOPRAMIDE HCL 5 MG PO TABS: 5.0000 mg | ORAL_TABLET | Freq: Three times a day (TID) | ORAL | Status: DC | PRN

## 2016-05-29 MED ORDER — FENTANYL CITRATE (PF) 100 MCG/2ML IJ SOLN
INTRAMUSCULAR | Status: DC | PRN
  Administered 2016-05-29 (×6): 50 ug via INTRAVENOUS

## 2016-05-29 MED ORDER — BUPIVACAINE LIPOSOME 1.3 % IJ SUSP
INTRAMUSCULAR | Status: DC | PRN
  Administered 2016-05-29: 15 mL

## 2016-05-29 MED ORDER — ENOXAPARIN SODIUM 40 MG/0.4ML ~~LOC~~ SOLN
40.0000 mg | SUBCUTANEOUS | Status: DC
  Administered 2016-05-30: 40 mg via SUBCUTANEOUS
  Filled 2016-05-29: qty 0.4

## 2016-05-29 MED ORDER — OXYCODONE HCL 5 MG PO TABS
5.0000 mg | ORAL_TABLET | ORAL | Status: DC | PRN
  Administered 2016-05-29 – 2016-05-30 (×4): 10 mg via ORAL
  Filled 2016-05-29 (×3): qty 2

## 2016-05-29 MED ORDER — ROCURONIUM BROMIDE 100 MG/10ML IV SOLN
INTRAVENOUS | Status: DC | PRN
  Administered 2016-05-29: 40 mg via INTRAVENOUS
  Administered 2016-05-29: 15 mg via INTRAVENOUS
  Administered 2016-05-29: 10 mg via INTRAVENOUS

## 2016-05-29 MED ORDER — DEXAMETHASONE SODIUM PHOSPHATE 10 MG/ML IJ SOLN
INTRAMUSCULAR | Status: DC | PRN
  Administered 2016-05-29: 10 mg via INTRAVENOUS

## 2016-05-29 MED ORDER — OXYCODONE HCL 5 MG PO TABS
ORAL_TABLET | ORAL | Status: AC
  Filled 2016-05-29: qty 2

## 2016-05-29 MED ORDER — FENTANYL CITRATE (PF) 100 MCG/2ML IJ SOLN
INTRAMUSCULAR | Status: AC
  Filled 2016-05-29: qty 2

## 2016-05-29 MED ORDER — DEXAMETHASONE SODIUM PHOSPHATE 10 MG/ML IJ SOLN
INTRAMUSCULAR | Status: AC
  Filled 2016-05-29: qty 1

## 2016-05-29 MED ORDER — LIDOCAINE HCL (CARDIAC) 20 MG/ML IV SOLN
INTRAVENOUS | Status: DC | PRN
  Administered 2016-05-29: 80 mg via INTRAVENOUS

## 2016-05-29 MED ORDER — DOCUSATE SODIUM 100 MG PO CAPS
100.0000 mg | ORAL_CAPSULE | Freq: Two times a day (BID) | ORAL | Status: DC
  Administered 2016-05-29 – 2016-05-30 (×2): 100 mg via ORAL
  Filled 2016-05-29 (×2): qty 1

## 2016-05-29 MED ORDER — METOCLOPRAMIDE HCL 5 MG/ML IJ SOLN: 5.0000 mg | Freq: Three times a day (TID) | INTRAMUSCULAR | Status: DC | PRN

## 2016-05-29 MED ORDER — HEMOSTATIC AGENTS (NO CHARGE) OPTIME
TOPICAL | Status: DC | PRN
  Administered 2016-05-29: 1 via TOPICAL

## 2016-05-29 MED ORDER — CEFAZOLIN SODIUM-DEXTROSE 2-3 GM-% IV SOLR
INTRAVENOUS | Status: DC | PRN
  Administered 2016-05-29: 2 g via INTRAVENOUS

## 2016-05-29 MED ORDER — FENTANYL CITRATE (PF) 100 MCG/2ML IJ SOLN
25.0000 ug | INTRAMUSCULAR | Status: DC | PRN
Start: 2016-05-29 — End: 2016-05-29
  Administered 2016-05-29 (×3): 50 ug via INTRAVENOUS

## 2016-05-29 MED ORDER — METHOCARBAMOL 500 MG PO TABS
500.0000 mg | ORAL_TABLET | Freq: Four times a day (QID) | ORAL | Status: DC | PRN
  Administered 2016-05-29: 500 mg via ORAL

## 2016-05-29 MED ORDER — CHLORHEXIDINE GLUCONATE 4 % EX LIQD: 60.0000 mL | Freq: Once | CUTANEOUS | Status: DC

## 2016-05-29 MED ORDER — MAGNESIUM CITRATE PO SOLN: 1.0000 | Freq: Once | ORAL | Status: DC | PRN

## 2016-05-29 MED ORDER — POTASSIUM CHLORIDE IN NACL 20-0.9 MEQ/L-% IV SOLN
INTRAVENOUS | Status: DC
  Administered 2016-05-29: 18:00:00 via INTRAVENOUS
  Filled 2016-05-29 (×2): qty 1000

## 2016-05-29 MED ORDER — PHENYLEPHRINE HCL 10 MG/ML IJ SOLN
INTRAMUSCULAR | Status: DC | PRN
  Administered 2016-05-29 (×2): 80 ug via INTRAVENOUS

## 2016-05-29 MED ORDER — ASPIRIN 325 MG PO TABS
325.0000 mg | ORAL_TABLET | Freq: Every day | ORAL | Status: DC
  Administered 2016-05-29 – 2016-05-30 (×2): 325 mg via ORAL
  Filled 2016-05-29 (×2): qty 1

## 2016-05-29 MED ORDER — PROPOFOL 10 MG/ML IV BOLUS
INTRAVENOUS | Status: DC | PRN
  Administered 2016-05-29: 150 mg via INTRAVENOUS

## 2016-05-29 MED ORDER — HYDROMORPHONE HCL 1 MG/ML IJ SOLN
1.0000 mg | INTRAMUSCULAR | Status: DC | PRN
  Administered 2016-05-29 – 2016-05-30 (×2): 1 mg via INTRAVENOUS
  Filled 2016-05-29 (×2): qty 1

## 2016-05-29 MED ORDER — BUPIVACAINE HCL (PF) 0.25 % IJ SOLN
INTRAMUSCULAR | Status: AC
  Filled 2016-05-29: qty 30

## 2016-05-29 MED ORDER — ONDANSETRON HCL 4 MG PO TABS
4.0000 mg | ORAL_TABLET | Freq: Four times a day (QID) | ORAL | Status: DC | PRN
  Administered 2016-05-30: 4 mg via ORAL
  Filled 2016-05-29: qty 1

## 2016-05-29 MED ORDER — ACETAMINOPHEN 650 MG RE SUPP: 650.0000 mg | Freq: Four times a day (QID) | RECTAL | Status: DC | PRN

## 2016-05-29 SURGICAL SUPPLY — 94 items
BANDAGE ACE 4X5 VEL STRL LF (GAUZE/BANDAGES/DRESSINGS) ×2 IMPLANT
BANDAGE ELASTIC 4 VELCRO ST LF (GAUZE/BANDAGES/DRESSINGS) ×3 IMPLANT
BANDAGE ELASTIC 6 VELCRO ST LF (GAUZE/BANDAGES/DRESSINGS) ×3 IMPLANT
BANDAGE ESMARK 6X9 LF (GAUZE/BANDAGES/DRESSINGS) ×1 IMPLANT
BIT DRILL 2.5X2.75 QC CALB (BIT) ×2 IMPLANT
BIT DRILL CALIBRATED 2.7 (BIT) ×1 IMPLANT
BIT DRILL CALIBRATED 2.7MM (BIT) ×1
BLADE SURG 10 STRL SS (BLADE) ×3 IMPLANT
BLADE SURG 15 STRL LF DISP TIS (BLADE) ×1 IMPLANT
BLADE SURG 15 STRL SS (BLADE) ×3
BLADE SURG ROTATE 9660 (MISCELLANEOUS) IMPLANT
BNDG CMPR 9X6 STRL LF SNTH (GAUZE/BANDAGES/DRESSINGS) ×1
BNDG COHESIVE 4X5 TAN STRL (GAUZE/BANDAGES/DRESSINGS) ×3 IMPLANT
BNDG ESMARK 6X9 LF (GAUZE/BANDAGES/DRESSINGS) ×3
BNDG GAUZE ELAST 4 BULKY (GAUZE/BANDAGES/DRESSINGS) ×3 IMPLANT
BRUSH SCRUB DISP (MISCELLANEOUS) ×6 IMPLANT
COVER MAYO STAND STRL (DRAPES) ×3 IMPLANT
DRAPE C-ARM 42X72 X-RAY (DRAPES) ×3 IMPLANT
DRAPE C-ARMOR (DRAPES) ×3 IMPLANT
DRAPE INCISE IOBAN 66X45 STRL (DRAPES) ×3 IMPLANT
DRAPE ORTHO SPLIT 77X108 STRL (DRAPES)
DRAPE SURG ORHT 6 SPLT 77X108 (DRAPES) IMPLANT
DRAPE U-SHAPE 47X51 STRL (DRAPES) ×3 IMPLANT
DRSG ADAPTIC 3X8 NADH LF (GAUZE/BANDAGES/DRESSINGS) ×3 IMPLANT
DRSG PAD ABDOMINAL 8X10 ST (GAUZE/BANDAGES/DRESSINGS) ×12 IMPLANT
ELECT REM PT RETURN 9FT ADLT (ELECTROSURGICAL) ×3
ELECTRODE REM PT RTRN 9FT ADLT (ELECTROSURGICAL) ×1 IMPLANT
EVACUATOR 1/8 PVC DRAIN (DRAIN) IMPLANT
EVACUATOR 3/16  PVC DRAIN (DRAIN)
EVACUATOR 3/16 PVC DRAIN (DRAIN) IMPLANT
GAUZE SPONGE 4X4 12PLY STRL (GAUZE/BANDAGES/DRESSINGS) ×3 IMPLANT
GLOVE BIO SURGEON STRL SZ7.5 (GLOVE) ×3 IMPLANT
GLOVE BIO SURGEON STRL SZ8 (GLOVE) ×3 IMPLANT
GLOVE BIOGEL PI IND STRL 7.5 (GLOVE) ×1 IMPLANT
GLOVE BIOGEL PI IND STRL 8 (GLOVE) ×1 IMPLANT
GLOVE BIOGEL PI INDICATOR 7.5 (GLOVE) ×2
GLOVE BIOGEL PI INDICATOR 8 (GLOVE) ×2
GLOVE PROGUARD SZ 7 1/2 (GLOVE) ×3 IMPLANT
GOWN STRL REUS W/ TWL LRG LVL3 (GOWN DISPOSABLE) ×2 IMPLANT
GOWN STRL REUS W/ TWL XL LVL3 (GOWN DISPOSABLE) ×1 IMPLANT
GOWN STRL REUS W/TWL LRG LVL3 (GOWN DISPOSABLE) ×6
GOWN STRL REUS W/TWL XL LVL3 (GOWN DISPOSABLE) ×3
IMMOBILIZER KNEE 22 UNIV (SOFTGOODS) ×3 IMPLANT
K-WIRE ACE 1.6X6 (WIRE) ×6
KIT BASIN OR (CUSTOM PROCEDURE TRAY) ×3 IMPLANT
KIT ROOM TURNOVER OR (KITS) ×3 IMPLANT
KWIRE ACE 1.6X6 (WIRE) IMPLANT
MANIFOLD NEPTUNE II (INSTRUMENTS) ×3 IMPLANT
NDL SUT .5 MAYO 1.404X.05X (NEEDLE) IMPLANT
NDL SUT 6 .5 CRC .975X.05 MAYO (NEEDLE) ×1 IMPLANT
NEEDLE 22X1 1/2 (OR ONLY) (NEEDLE) ×3 IMPLANT
NEEDLE MAYO TAPER (NEEDLE) ×3
NS IRRIG 1000ML POUR BTL (IV SOLUTION) ×3 IMPLANT
PACK ORTHO EXTREMITY (CUSTOM PROCEDURE TRAY) ×3 IMPLANT
PAD ARMBOARD 7.5X6 YLW CONV (MISCELLANEOUS) ×6 IMPLANT
PAD CAST 4YDX4 CTTN HI CHSV (CAST SUPPLIES) ×1 IMPLANT
PADDING CAST ABS 4INX4YD NS (CAST SUPPLIES) ×2
PADDING CAST ABS COTTON 4X4 ST (CAST SUPPLIES) ×1 IMPLANT
PADDING CAST COTTON 4X4 STRL (CAST SUPPLIES) ×3
PADDING CAST COTTON 6X4 STRL (CAST SUPPLIES) ×3 IMPLANT
PLATE 6H 142 LT MED DIST TIB (Plate) ×2 IMPLANT
SCREW CORTICAL 3.5MM  28MM (Screw) ×2 IMPLANT
SCREW CORTICAL 3.5MM  34MM (Screw) ×2 IMPLANT
SCREW CORTICAL 3.5MM 26MM (Screw) ×3 IMPLANT
SCREW CORTICAL 3.5MM 28MM (Screw) IMPLANT
SCREW CORTICAL 3.5MM 34MM (Screw) IMPLANT
SCREW CORTICAL 3.5MM 38MM (Screw) ×2 IMPLANT
SCREW LOCK 3.5X36 DIST TIB (Screw) ×2 IMPLANT
SCREW LP 3.5 (Screw) ×4 IMPLANT
SPLINT PLASTER CAST XFAST 5X30 (CAST SUPPLIES) IMPLANT
SPLINT PLASTER XFAST SET 5X30 (CAST SUPPLIES) ×2
SPONGE GAUZE 4X4 12PLY STER LF (GAUZE/BANDAGES/DRESSINGS) ×2 IMPLANT
SPONGE LAP 18X18 X RAY DECT (DISPOSABLE) ×3 IMPLANT
STAPLER VISISTAT 35W (STAPLE) ×3 IMPLANT
STOCKINETTE IMPERVIOUS LG (DRAPES) ×3 IMPLANT
SUCTION FRAZIER HANDLE 10FR (MISCELLANEOUS) ×2
SUCTION TUBE FRAZIER 10FR DISP (MISCELLANEOUS) ×1 IMPLANT
SUT ETHILON 3 0 PS 1 (SUTURE) ×4 IMPLANT
SUT PROLENE 0 CT 2 (SUTURE) ×6 IMPLANT
SUT VIC AB 0 CT1 27 (SUTURE) ×6
SUT VIC AB 0 CT1 27XBRD ANBCTR (SUTURE) ×1 IMPLANT
SUT VIC AB 1 CT1 27 (SUTURE) ×3
SUT VIC AB 1 CT1 27XBRD ANBCTR (SUTURE) ×1 IMPLANT
SUT VIC AB 2-0 CT1 27 (SUTURE) ×9
SUT VIC AB 2-0 CT1 TAPERPNT 27 (SUTURE) ×2 IMPLANT
SYR 20ML ECCENTRIC (SYRINGE) IMPLANT
SYR CONTROL 10ML LL (SYRINGE) ×2 IMPLANT
TOWEL OR 17X24 6PK STRL BLUE (TOWEL DISPOSABLE) ×3 IMPLANT
TOWEL OR 17X26 10 PK STRL BLUE (TOWEL DISPOSABLE) ×6 IMPLANT
TRAY FOLEY CATH 16FRSI W/METER (SET/KITS/TRAYS/PACK) IMPLANT
TUBE CONNECTING 12'X1/4 (SUCTIONS) ×1
TUBE CONNECTING 12X1/4 (SUCTIONS) ×2 IMPLANT
WATER STERILE IRR 1000ML POUR (IV SOLUTION) ×2 IMPLANT
YANKAUER SUCT BULB TIP NO VENT (SUCTIONS) ×3 IMPLANT

## 2016-05-29 NOTE — Brief Op Note (Signed)
05/29/2016  10:41 AM  PATIENT:  Justin [name]  65 y.o. male  PRE-OPERATIVE DIAGNOSIS:  LEFT DISTAL TIBIAL NON UNION  POST-OPERATIVE DIAGNOSIS:  LEFT DISTAL TIBIAL NON UNION  PROCEDURE:  Procedure(s): OPEN REDUCTION INTERNAL FIXATION (ORIF)  REPAIR LEFT DISTAL TIBIAL NONUNION with ILIAC CREST BONE AUTOGRAFT (Left)  SURGEON:  Surgeon(s) and Role:    * Altamese [place], MD - Primary  PHYSICIAN ASSISTANT: [name], PAC  ANESTHESIA:   regional and general  EBL:  Total I/O In: 1500 [I.V.:1500] Out: 150 [Blood:150]  BLOOD ADMINISTERED:none  DRAINS: none   LOCAL MEDICATIONS USED:  NONE  SPECIMEN:  Source of Specimen:  TIBIAL NONUNION SITE  DISPOSITION OF SPECIMEN:  MICRO  COUNTS:  YES  TOURNIQUET:  * No tourniquets in log *  DICTATION: .Other Dictation: Dictation Number [id number]  PLAN OF CARE: Admit to inpatient   PATIENT DISPOSITION:  PACU - hemodynamically stable.   Delay start of Pharmacological VTE agent (>24hrs) due to surgical blood loss or risk of bleeding: no

## 2016-05-29 NOTE — Transfer of Care (Signed)
Immediate Anesthesia Transfer of Care Note  Patient: Justin [name]  Procedure(s) Performed: Procedure(s): OPEN REDUCTION INTERNAL FIXATION (ORIF)  REPAIR LEFT DISTAL TIBIAL NON UNION ILLIAC CREST BONE GRAFT (Left)  Patient Location: PACU  Anesthesia Type:General and regional  Level of Consciousness: awake and alert   Airway & Oxygen Therapy: Patient Spontanous Breathing and Patient connected to nasal cannula oxygen  Post-op Assessment: Report given to RN, Post -op Vital signs reviewed and stable and Patient moving all extremities X 4  Post vital signs: Reviewed and stable  Last Vitals:  Vitals:   05/29/16 0651  BP: (!) 146/93  Pulse: 88  Resp: 20    Last Pain:  Vitals:   05/29/16 0749  PainSc: 1          Complications: No apparent anesthesia complications

## 2016-05-29 NOTE — Telephone Encounter (Signed)
lmtcb regarding link explant dates

## 2016-05-29 NOTE — Anesthesia Procedure Notes (Signed)
Anesthesia Regional Block:  Adductor canal block  Pre-Anesthetic Checklist: ,, timeout performed, Correct Patient, Correct Site, Correct Laterality, Correct Procedure, Correct Position, site marked, Risks and benefits discussed,  Surgical consent,  Pre-op evaluation,  At surgeon's request and post-op pain management  Laterality: Left  Prep: chloraprep       Needles:   Needle Type: Echogenic Needle     Needle Length: 5cm 5 cm Needle Gauge: 22 and 22 G    Additional Needles:  Procedures: ultrasound guided (picture in chart) Adductor canal block Narrative:  Start time: 05/29/2016 8:00 AM End time: 05/29/2016 8:05 AM Injection made incrementally with aspirations every 25 mL.  Performed by: Personally  Anesthesiologist: Reginal Lutes  Additional Notes: Patient tolerated procedure well.

## 2016-05-29 NOTE — Anesthesia Postprocedure Evaluation (Signed)
Anesthesia Post Note  Patient: Justin [name]  Procedure(s) Performed: Procedure(s) (LRB): OPEN REDUCTION INTERNAL FIXATION (ORIF)  REPAIR LEFT DISTAL TIBIAL NON UNION ILLIAC CREST BONE GRAFT (Left)  Patient location during evaluation: PACU Anesthesia Type: General Level of consciousness: awake and alert Pain management: pain level controlled Vital Signs Assessment: post-procedure vital signs reviewed and stable Respiratory status: spontaneous breathing, nonlabored ventilation, respiratory function stable and patient connected to nasal cannula oxygen Cardiovascular status: blood pressure returned to baseline and stable Postop Assessment: no signs of nausea or vomiting Anesthetic complications: no    Last Vitals:  Vitals:   05/29/16 1320 05/29/16 1330  BP:    Pulse: 73 85  Resp: 13 14  Temp:      Last Pain:  Vitals:   05/29/16 1330  PainSc: 4                  Andjela Wickes Minda Meo

## 2016-05-29 NOTE — Anesthesia Preprocedure Evaluation (Addendum)
Anesthesia Evaluation  Patient identified by MRN, date of birth, ID band Patient awake    Reviewed: Allergy & Precautions, NPO status , Patient's Chart, lab work & pertinent test results  History of Anesthesia Complications Negative for: history of anesthetic complications  Airway Mallampati: II  TM Distance: >3 FB     Dental  (+) Teeth Intact, Dental Advisory Given   Pulmonary    breath sounds clear to auscultation       Cardiovascular  Rhythm:Regular Rate:Normal     Neuro/Psych TIA Neuromuscular disease CVA    GI/Hepatic   Endo/Other    Renal/GU      Musculoskeletal   Abdominal   Peds  Hematology   Anesthesia Other Findings   Reproductive/Obstetrics                            Anesthesia Physical  Anesthesia Plan  ASA: III  Anesthesia Plan: Regional and General   Post-op Pain Management:  Regional for Post-op pain   Induction: Intravenous  Airway Management Planned: LMA  Additional Equipment: None  Intra-op Plan:   Post-operative Plan: Extubation in OR  Informed Consent: I have reviewed the patients History and Physical, chart, labs and discussed the procedure including the risks, benefits and alternatives for the proposed anesthesia with the patient or authorized representative who has indicated his/her understanding and acceptance.   Dental advisory given  Plan Discussed with: CRNA, Anesthesiologist and Surgeon  Anesthesia Plan Comments:         Anesthesia Quick Evaluation

## 2016-05-29 NOTE — Op Note (Signed)
NAMESPARROW, UHLENHAKE NO.:  [id number]  MEDICAL RECORD NO.:  [id number]  LOCATION:  SDS                          FACILITY:  [facility]  PHYSICIAN:  Astrid Divine. Marcelino Scot, M.D. DATE OF BIRTH:  03/22/1951  DATE OF PROCEDURE:  05/29/2016 DATE OF DISCHARGE:  05/25/2016                              OPERATIVE REPORT   PREOPERATIVE DIAGNOSIS:  Left distal tibia nonunion status post open fracture.  POSTOPERATIVE DIAGNOSIS:  Left distal tibia nonunion status post open fracture.  PROCEDURE:  Repair of left distal tibia nonunion using iliac crest bone autograft.  SURGEON:  Astrid Divine. Marcelino Scot, M.D.  ASSISTANT:  [name], PA-C.  ANESTHESIA:  General supplemented with regional block.  I/O:  1500 mL crystalloid/150 mL EBL.  SPECIMENS:  Nonunion site to micro.  TOURNIQUET:  None.  DISPOSITION:  To PACU.  CONDITION:  Stable.  BRIEF SUMMARY AND INDICATION FOR PROCEDURE:  Justin [name] is a very pleasant, 65 year old male, who is now over a year out from his open tibial pilon fracture.  He had a stroke in the perioperative period after initial treatment and never underwent definitive internal fixation.  He went on to good clinical function initially, but never developed a union.  CT scan confirmed persistent failure of consolidation across the transverse metaphysis above the subchondral bone block.  It became progressively symptomatic from same with decreased activity.  I discussed with him the risks and benefits of surgical repair including the potential for stroke, nerve injury, vessel injury, failure of repair, symptoms related to his iliac crest harvest site including painter fracture, DVT, PE, and other complications.  The patient acknowledged these risks as did his wife and they did wish to proceed.  BRIEF SUMMARY OF PROCEDURE:  The patient was taken to the operating room where general anesthesia was induced.  He did receive preoperative antibiotics as we had no  clinical concerns regarding infection.  A midline incision was made transversely cut across his old traumatic wound, and there was no ecchymosis of his skin flaps to suggest devascularization.  This enabled [place] to obtain excellent exposure to the medial side of the entire nonunion site.  C-arm was brought into confirm appropriate debridement of same.  We used a 15 blade and then serial curettes and lavaged to get back to healthy bone on both sides.  The wound was irrigated thoroughly and this material sent to micro.  It was then packed.  Attention was turned proximally, to the iliac crest.  Here a 4 cm incision was made.  Dissection carried down to the amuscular plane and the edge of the iliac crest identified and exposure gained to enable an osteotomy, which enabled [place] then to reflect the superior edge of the iliac crest and exposed the cancellous bone.  A robust harvest was then performed using serial gouges and curette being careful to preserve the inner and outer tables.  We obtained 20 mL or more of cancellous bone.  It was very healthy.  This was taken down and placed into the defect tamping into place working from lateral to medial with excellent fit and fill.  The Biomet distal tibial locking plate was then applied and secured with 3  screws in the subchondral area and 3 standard screws in oblong holes proximally.  Final images showed appropriate reduction, graft placement and hardware trajectory and length.  Wound was irrigated, closed in a standard layered fashion.  Proximally, we used Exparel in addition to standard layer closure repairing the trapdoor with #1 Vicryl, using 0 Vicryl, 2-0 Vicryl, and 3-0 nylon. [name], PA-C assisted me throughout.  His assistance was necessary for the safe and effective completion of the case.  This was because the deep retraction at the hip and then the simultaneous closure that while I worked distally with grafting and also for initial  exposure distally.  PROGNOSIS:  We will follow up on the cultures.  We anticipate a 1-2 day stay depending on comfort.  He will be non-weightbearing for the next 6 weeks with weightbearing as tolerated.  Thereafter, he will have early range of motion and given the lack of clinical infection and the quality of the graft material, we hopeful will go on to unite and continue to watch closely for any neurologic complications in the postoperative period restarting his anticoagulation.     Astrid Divine. Marcelino Scot, M.D.     MHH/MEDQ  D:  05/29/2016  T:  05/29/2016  Job:  [id number]

## 2016-05-29 NOTE — Anesthesia Procedure Notes (Signed)
Procedure Name: Intubation Date/Time: 05/29/2016 8:22 AM Performed by: Rejeana Brock L Pre-anesthesia Checklist: Patient identified, Emergency Drugs available, Suction available and Patient being monitored Patient Re-evaluated:Patient Re-evaluated prior to inductionOxygen Delivery Method: Circle System Utilized Preoxygenation: Pre-oxygenation with 100% oxygen Intubation Type: IV induction Ventilation: Mask ventilation without difficulty and Oral airway inserted - appropriate to patient size Laryngoscope Size: Mac and 4 Grade View: Grade I Tube type: Oral Tube size: 7.5 mm Number of attempts: 1 Airway Equipment and Method: Stylet and Oral airway Placement Confirmation: ETT inserted through vocal cords under direct vision,  positive ETCO2 and breath sounds checked- equal and bilateral Secured at: 22 cm Tube secured with: Tape Dental Injury: Teeth and Oropharynx as per pre-operative assessment

## 2016-05-30 ENCOUNTER — Encounter (HOSPITAL_COMMUNITY): Payer: Self-pay | Admitting: Orthopedic Surgery

## 2016-05-30 DIAGNOSIS — Z9889 Other specified postprocedural states: Secondary | ICD-10-CM

## 2016-05-30 DIAGNOSIS — S82302K Unspecified fracture of lower end of left tibia, subsequent encounter for closed fracture with nonunion: Secondary | ICD-10-CM | POA: Diagnosis not present

## 2016-05-30 DIAGNOSIS — Z7901 Long term (current) use of anticoagulants: Secondary | ICD-10-CM

## 2016-05-30 DIAGNOSIS — S82302M Unspecified fracture of lower end of left tibia, subsequent encounter for open fracture type I or II with nonunion: Secondary | ICD-10-CM

## 2016-05-30 HISTORY — DX: Long term (current) use of anticoagulants: Z79.01

## 2016-05-30 HISTORY — DX: Unspecified fracture of lower end of left tibia, subsequent encounter for open fracture type I or II with nonunion: S82.302M

## 2016-05-30 LAB — CBC
HEMATOCRIT: 41.7 % (ref 39.0–52.0)
HEMOGLOBIN: 13.1 g/dL (ref 13.0–17.0)
MCH: 27.5 pg (ref 26.0–34.0)
MCHC: 31.4 g/dL (ref 30.0–36.0)
MCV: 87.4 fL (ref 78.0–100.0)
Platelets: 221 10*3/uL (ref 150–400)
RBC: 4.77 MIL/uL (ref 4.22–5.81)
RDW: 14.4 % (ref 11.5–15.5)
WBC: 8.7 10*3/uL (ref 4.0–10.5)

## 2016-05-30 LAB — BASIC METABOLIC PANEL
ANION GAP: 5 (ref 5–15)
BUN: 8 mg/dL (ref 6–20)
CHLORIDE: 105 mmol/L (ref 101–111)
CO2: 28 mmol/L (ref 22–32)
Calcium: 9.2 mg/dL (ref 8.9–10.3)
Creatinine, Ser: 0.92 mg/dL (ref 0.61–1.24)
GFR calc non Af Amer: >60 mL/min (ref >60)
Glucose, Bld: 109 mg/dL — ABNORMAL HIGH (ref 65–99)
POTASSIUM: 5.1 mmol/L (ref 3.5–5.1)
Sodium: 138 mmol/L (ref 135–145)

## 2016-05-30 MED ORDER — POLYETHYLENE GLYCOL 3350 17 G PO PACK: 17.0000 g | PACK | Freq: Every day | ORAL | 0 refills | Status: DC

## 2016-05-30 MED ORDER — ENOXAPARIN (LOVENOX) PATIENT EDUCATION KIT
PACK | Freq: Once | Status: DC
  Filled 2016-05-30: qty 1

## 2016-05-30 MED ORDER — OXYCODONE HCL 5 MG PO TABS: 5.0000 mg | ORAL_TABLET | Freq: Four times a day (QID) | ORAL | 0 refills | Status: DC | PRN

## 2016-05-30 MED ORDER — METHOCARBAMOL 500 MG PO TABS: 500.0000 mg | ORAL_TABLET | Freq: Four times a day (QID) | ORAL | 0 refills | Status: DC | PRN

## 2016-05-30 MED ORDER — DOCUSATE SODIUM 100 MG PO CAPS: 100.0000 mg | ORAL_CAPSULE | Freq: Two times a day (BID) | ORAL | 0 refills | Status: DC

## 2016-05-30 MED ORDER — ENOXAPARIN SODIUM 40 MG/0.4ML ~~LOC~~ SOLN: 40.0000 mg | SUBCUTANEOUS | 0 refills | Status: DC

## 2016-05-30 MED ORDER — OXYCODONE-ACETAMINOPHEN 5-325 MG PO TABS: 1.0000 | ORAL_TABLET | Freq: Four times a day (QID) | ORAL | 0 refills | Status: DC | PRN

## 2016-05-30 NOTE — Evaluation (Signed)
Physical Therapy Evaluation Patient Details Name: Justin [name] MRN: [id number] DOB: September 08, 1951 Today's Date: 05/30/2016   History of Present Illness  65 y.o. 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 PMH: CVA, tibia fx, shoulder dislocation.   Clinical Impression  Pt mobilizing well during initial PT session, ambulating 50 feet and going up/down stairs. Pt and spouse report felling confident with mobility at this time. PT to continue to follow acutely. Patient denies any questions or concerns.      Follow Up Recommendations No PT follow up;Supervision for mobility/OOB    Equipment Recommendations  None recommended by PT    Recommendations for Other Services       Precautions / Restrictions Precautions Precautions: Fall Restrictions Weight Bearing Restrictions: Yes LLE Weight Bearing: Non weight bearing      Mobility  Bed Mobility Overal bed mobility: Modified Independent Bed Mobility: Supine to Sit     Supine to sit: Modified independent (Device/Increase time)        Transfers Overall transfer level: Needs assistance Equipment used: Rolling walker (2 wheeled) Transfers: Sit to/from Stand Sit to Stand: Supervision         General transfer comment: good technique and stability  Ambulation/Gait Ambulation/Gait assistance: Min guard;Supervision Ambulation Distance (Feet): 50 Feet Assistive device: Rolling walker (2 wheeled) Gait Pattern/deviations:  (swing-to pattern) Gait velocity: decreased   General Gait Details: good stability, no loss of balance  Stairs Stairs: Yes Stairs assistance: Min guard Stair Management: With walker;With crutches;Forwards;Backwards;One rail Right Number of Stairs: 4 General stair comments: Attempted stairs initially with posterior approach with rw, pt reports not having enough UE strength for second step. Modified to rail and crutch which the pt was able to go up/down 3 steps. Spouse observing throughout session. Pt and  spouse report feeling confident with stairs following session.   Wheelchair Mobility    Modified Rankin (Stroke Patients Only)       Balance Overall balance assessment: Needs assistance Sitting-balance support: No upper extremity supported Sitting balance-Leahy Scale: Good     Standing balance support: Bilateral upper extremity supported Standing balance-Leahy Scale: Poor Standing balance comment: using rw for support                             Pertinent Vitals/Pain Pain Assessment: 0-10 Pain Score: 5  Pain Location: Lt leg Pain Descriptors / Indicators: Aching Pain Intervention(s): Limited activity within patient's tolerance;Monitored during session    Home Living Family/patient expects to be discharged to:: Private residence Living Arrangements: Spouse/significant other Available Help at Discharge: Family;Available 24 hours/day Type of Home: House Home Access: Stairs to enter Entrance Stairs-Rails: Right Entrance Stairs-Number of Steps: 2 Home Layout: Two level;Able to live on main level with bedroom/bathroom Home Equipment: Gilford Rile - 2 wheels;Cane - single point;Crutches;Wheelchair - manual      Prior Function Level of Independence: Independent               Hand Dominance        Extremity/Trunk Assessment   Upper Extremity Assessment: Overall WFL for tasks assessed           Lower Extremity Assessment: LLE deficits/detail   LLE Deficits / Details: able to raise and move LE independently     Communication   Communication: No difficulties  Cognition Arousal/Alertness: Awake/alert Behavior During Therapy: WFL for tasks assessed/performed Overall Cognitive Status: Within Functional Limits for tasks assessed  General Comments      Exercises        Assessment/Plan    PT Assessment Patient needs continued PT services  PT Diagnosis Difficulty walking   PT Problem List Decreased strength;Decreased  activity tolerance;Decreased balance;Decreased mobility  PT Treatment Interventions DME instruction;Gait training;Stair training;Functional mobility training;Therapeutic activities;Therapeutic exercise;Balance training;Patient/family education   PT Goals (Current goals can be found in the Care Plan section) Acute Rehab PT Goals Patient Stated Goal: get home PT Goal Formulation: With patient Time For Goal Achievement: 06/06/16 Potential to Achieve Goals: Good    Frequency Min 5X/week   Barriers to discharge        Co-evaluation               End of Session Equipment Utilized During Treatment: Gait belt Activity Tolerance: Patient tolerated treatment well Patient left: in chair;with call bell/phone within reach;with family/visitor present Nurse Communication: Mobility status;Weight bearing status    Functional Assessment Tool Used: clinical judgment Functional Limitation: Mobility: Walking and moving around Mobility: Walking and Moving Around Current Status [phone]): At least 20 percent but less than 40 percent impaired, limited or restricted Mobility: Walking and Moving Around Goal Status [phone]): At least 1 percent but less than 20 percent impaired, limited or restricted    Time: 0906-0948 PT Time Calculation (min) (ACUTE ONLY): 42 min   Charges:   PT Evaluation $PT Eval Moderate Complexity: 1 Procedure PT Treatments $Gait Training: 23-37 mins   PT G Codes:   PT G-Codes **NOT FOR INPATIENT CLASS** Functional Assessment Tool Used: clinical judgment Functional Limitation: Mobility: Walking and moving around Mobility: Walking and Moving Around Current Status [id number]): At least 20 percent but less than 40 percent impaired, limited or restricted Mobility: Walking and Moving Around Goal Status [phone]): At least 1 percent but less than 20 percent impaired, limited or restricted    Cassell Clement, PT, [place] Pager [phone] Office [phone]  05/30/2016, 9:59  AM

## 2016-05-30 NOTE — Discharge Instructions (Signed)
Orthopaedic Trauma Service Discharge Instructions   General Discharge Instructions  WEIGHT BEARING STATUS: Nonweightbearing Left leg  RANGE OF MOTION/ACTIVITY: no ankle range of motion as you are in a splint. Keep splint clean and dry. We will remove at follow up appointment   Wound Care: n/a. Keep splint clean and dry   PAIN MEDICATION USE AND EXPECTATIONS  You have likely been given narcotic medications to help control your pain.  After a traumatic event that results in an fracture (broken bone) with or without surgery, it is ok to use narcotic pain medications to help control one's pain.  We understand that everyone responds to pain differently and each individual patient will be evaluated on a regular basis for the continued need for narcotic medications. Ideally, narcotic medication use should last no more than 6-8 weeks (coinciding with fracture healing).   As a patient it is your responsibility as well to monitor narcotic medication use and report the amount and frequency you use these medications when you come to your office visit.   We would also advise that if you are using narcotic medications, you should take a dose prior to therapy to maximize you participation.  IF YOU ARE ON NARCOTIC MEDICATIONS IT IS NOT PERMISSIBLE TO OPERATE A MOTOR VEHICLE (MOTORCYCLE/CAR/TRUCK/MOPED) OR HEAVY MACHINERY DO NOT MIX NARCOTICS WITH OTHER CNS (CENTRAL NERVOUS SYSTEM) DEPRESSANTS SUCH AS ALCOHOL  Diet: as you were eating previously.  Can use over the counter stool softeners and bowel preparations, such as Miralax, to help with bowel movements.  Narcotics can be constipating.  Be sure to drink plenty of fluids    STOP SMOKING OR USING NICOTINE PRODUCTS!!!!  As discussed nicotine severely impairs your body's ability to heal surgical and traumatic wounds but also impairs bone healing.  Wounds and bone heal by forming microscopic blood vessels (angiogenesis) and nicotine is a vasoconstrictor  (essentially, shrinks blood vessels).  Therefore, if vasoconstriction occurs to these microscopic blood vessels they essentially disappear and are unable to deliver necessary nutrients to the healing tissue.  This is one modifiable factor that you can do to dramatically increase your chances of healing your injury.    (This means no smoking, no nicotine gum, patches, etc)  DO NOT USE NONSTEROIDAL ANTI-INFLAMMATORY DRUGS (NSAID'S)  Using products such as Advil (ibuprofen), Aleve (naproxen), Motrin (ibuprofen) for additional pain control during fracture healing can delay and/or prevent the healing response.  If you would like to take over the counter (OTC) medication, Tylenol (acetaminophen) is ok.  However, some narcotic medications that are given for pain control contain acetaminophen as well. Therefore, you should not exceed more than 4000 mg of tylenol in a day if you do not have liver disease.  Also note that there are may OTC medicines, such as cold medicines and allergy medicines that my contain tylenol as well.  If you have any questions about medications and/or interactions please ask your doctor/PA or your pharmacist.      ICE AND ELEVATE INJURED/OPERATIVE EXTREMITY  Using ice and elevating the injured extremity above your heart can help with swelling and pain control.  Icing in a pulsatile fashion, such as 20 minutes on and 20 minutes off, can be followed.    Do not place ice directly on skin. Make sure there is a barrier between to skin and the ice pack.    Using frozen items such as frozen peas works well as the conform nicely to the are that needs to be iced.  USE  AN ACE WRAP OR TED HOSE FOR SWELLING CONTROL  In addition to icing and elevation, Ace wraps or TED hose are used to help limit and resolve swelling.  It is recommended to use Ace wraps or TED hose until you are informed to stop.    When using Ace Wraps start the wrapping distally (farthest away from the body) and wrap proximally  (closer to the body)   Example: If you had surgery on your leg or thing and you do not have a splint on, start the ace wrap at the toes and work your way up to the thigh        If you had surgery on your upper extremity and do not have a splint on, start the ace wrap at your fingers and work your way up to the upper arm  IF YOU ARE IN A SPLINT OR CAST DO NOT Elmo   If your splint gets wet for any reason please contact the office immediately. You may shower in your splint or cast as long as you keep it dry.  This can be done by wrapping in a cast cover or garbage back (or similar)  Do Not stick any thing down your splint or cast such as pencils, money, or hangers to try and scratch yourself with.  If you feel itchy take benadryl as prescribed on the bottle for itching  IF YOU ARE IN A CAM BOOT (BLACK BOOT)  You may remove boot periodically. Perform daily dressing changes as noted below.  Wash the liner of the boot regularly and wear a sock when wearing the boot. It is recommended that you sleep in the boot until told otherwise  CALL THE OFFICE WITH ANY QUESTIONS OR CONCERNS: [phone]

## 2016-05-30 NOTE — Progress Notes (Signed)
Orthopaedic Trauma Service (OTS)  Subjective: 1 Day Post-Op Procedure(s) (LRB): OPEN REDUCTION INTERNAL FIXATION (ORIF)  REPAIR LEFT DISTAL TIBIAL NON UNION ILLIAC CREST BONE GRAFT (Left) Patient reports pain as moderate.   C/o increasing pelvic harvest site pain, leg remains at a 5/10; IV meds working well; +N and one episode emesis thought related to oral oxycodone med but as pain has been well treated with Percocet in the past without sxs, he wishes to continue with current regimen a little bit longer before trying a switch to hydrocodone.  Objective: Current Vitals Blood pressure 108/69, pulse 89, temperature 98 F (36.7 C), temperature source Oral, resp. rate 17, height 5\' 9"  (1.753 m), weight 84.4 kg (186 lb), SpO2 96 %. Vital signs in last 24 hours: Temp:  [97.4 F (36.3 C)-98.8 F (37.1 C)] 98 F (36.7 C) (08/16 0522) Pulse Rate:  [73-99] 89 (08/16 0522) Resp:  [12-30] 17 (08/16 0522) BP: (108-147)/(69-96) 108/69 (08/16 0522) SpO2:  [92 %-100 %] 96 % (08/16 0522)  Intake/Output from previous day: 08/15 0701 - 08/16 0700 In: 2948.8 [P.O.:240; I.V.:2608.8; IV Piggyback:100] Out: 2285 [Urine:2135; Blood:150]  LABS  Recent Labs  05/29/16 1649 05/30/16 0557  HGB 14.0 13.1    Recent Labs  05/29/16 1649 05/30/16 0557  WBC 11.4* 8.7  RBC 5.01 4.77  HCT 43.1 41.7  PLT 217 221    Recent Labs  05/29/16 1649 05/30/16 0557  NA  --  138  K  --  5.1  CL  --  105  CO2  --  28  BUN  --  8  CREATININE 0.88 0.92  GLUCOSE  --  109*  CALCIUM  --  9.2   No results for input(s): LABPT, INR in the last 72 hours.  Physical Exam  Lungs clear LLE Dressings intact, clean, dry  Edema/ swelling controlled  Sens: DPN, SPN, TN intact  Motor: EHL, FHL, and lessor toe ext and flex all intact grossly  Brisk cap refill, warm to touch  Imaging Dg Ankle Complete Left  Result Date: 05/29/2016 CLINICAL DATA:  ORIF of left ankle fracture FLUOROSCOPY TIME:  47 seconds  Images: 2 EXAM: DG C-ARM 61-120 MIN; LEFT ANKLE COMPLETE - 3+ VIEW COMPARISON:  CT scan March 26, 2016 FINDINGS: Images were obtained during ORIF of left ankle fracture. A rod is seen in the distal fibula, unchanged. Plate and screws canal across the distal tibial fracture. Lucency remains at the fracture site. No other acute abnormalities. IMPRESSION: ORIF of distal left tibial fracture as above. Electronically Signed   By: Dorise Bullion III M.D   On: 05/29/2016 13:53   Dg Ankle Left Port  Result Date: 05/29/2016 CLINICAL DATA:  Repair of ankle fracture EXAM: PORTABLE LEFT ANKLE - 2 VIEW COMPARISON:  May 29, 2016 FINDINGS: A rod extends through the distal fibula with good alignment. A plate is affixed to the medial aspect of the distal tibia. Lucency persists at the fracture site. Hardware is in good position. Alignment is otherwise unremarkable. No other acute abnormalities. IMPRESSION: 1. Left ankle fracture repair as above. Electronically Signed   By: Dorise Bullion III M.D   On: 05/29/2016 13:51   Dg C-arm 61-120 Min  Result Date: 05/29/2016 CLINICAL DATA:  ORIF of left ankle fracture FLUOROSCOPY TIME:  47 seconds Images: 2 EXAM: DG C-ARM 61-120 MIN; LEFT ANKLE COMPLETE - 3+ VIEW COMPARISON:  CT scan March 26, 2016 FINDINGS: Images were obtained during ORIF of left ankle fracture. A rod is seen  in the distal fibula, unchanged. Plate and screws canal across the distal tibial fracture. Lucency remains at the fracture site. No other acute abnormalities. IMPRESSION: ORIF of distal left tibial fracture as above. Electronically Signed   By: Dorise Bullion III M.D   On: 05/29/2016 13:53    Assessment/Plan: 1 Day Post-Op Procedure(s) (LRB): OPEN REDUCTION INTERNAL FIXATION (ORIF)  REPAIR LEFT DISTAL TIBIAL NON UNION ILLIAC CREST BONE GRAFT (Left)  1. Lovenox and ASA  2. NWB LLE w PT, OT; may be candidate for d/c to home today 3. Cont with oxycodone products for now  Altamese [place],  MD [facility] [phone] (p)  05/30/2016, 8:49 AM

## 2016-06-01 ENCOUNTER — Ambulatory Visit: Payer: BLUE CROSS/BLUE SHIELD | Admitting: Family Medicine

## 2016-06-01 ENCOUNTER — Ambulatory Visit (INDEPENDENT_AMBULATORY_CARE_PROVIDER_SITE_OTHER): Payer: 59 | Admitting: *Deleted

## 2016-06-01 DIAGNOSIS — Z4509 Encounter for adjustment and management of other cardiac device: Secondary | ICD-10-CM

## 2016-06-01 DIAGNOSIS — I639 Cerebral infarction, unspecified: Secondary | ICD-10-CM

## 2016-06-01 LAB — CALCITRIOL (1,25 DI-OH VIT D): Vit D, 1,25-Dihydroxy: 72.1 pg/mL (ref 19.9–79.3)

## 2016-06-01 NOTE — Progress Notes (Signed)
Carelink Summary Report / Loop Recorder 

## 2016-06-03 LAB — AEROBIC/ANAEROBIC CULTURE W GRAM STAIN (SURGICAL/DEEP WOUND)

## 2016-06-03 LAB — AEROBIC/ANAEROBIC CULTURE (SURGICAL/DEEP WOUND): CULTURE: NO GROWTH

## 2016-06-14 ENCOUNTER — Encounter: Payer: Self-pay | Admitting: Internal Medicine

## 2016-06-14 NOTE — Telephone Encounter (Signed)
I called and spoke with the patient. He is 2 weeks out from orthopedic surgery (non-weight bearing at this time).  He states his wife is currently out of the house, but possible dates given for LINQ explant.  The patient will send me a MyChart message when he decides what date may work best.

## 2016-06-19 ENCOUNTER — Encounter: Payer: Self-pay | Admitting: Internal Medicine

## 2016-06-20 ENCOUNTER — Encounter: Payer: Self-pay | Admitting: *Deleted

## 2016-06-25 ENCOUNTER — Encounter: Payer: Self-pay | Admitting: Family Medicine

## 2016-06-27 LAB — CUP PACEART REMOTE DEVICE CHECK: MDC IDC SESS DTM: 20170818123637

## 2016-07-01 NOTE — Op Note (Signed)
[name], SETTLE NO.:  [id number]  MEDICAL RECORD NO.:  [id number]  LOCATION:  5N12C                        FACILITY:  [facility]  PHYSICIAN:  Astrid Divine. Marcelino Scot, M.D. 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  PROCEDURE:  Repair of left tibial nonunion with iliac crest bone grafting.  SURGEON:  Astrid Divine. Marcelino Scot, M.D.  ASSISTANT:  [name], PA-C.  ANESTHESIA:  General.  COMPLICATIONS:  None.  TOURNIQUET:  None.  SPECIMEN:  Two anaerobic and aerobic cultures sent from the nonunion site.  DISPOSITION:  To PACU.  CONDITION:  Stable.  BRIEF SUMMARY OF INDICATIONS FOR PROCEDURE:  Justin [name] is a very pleasant, 65 year old male, who had sustained an open fracture.  The patient had significant medical history including strokes of unknown etiology.  Because of this stroke risks including one he suffered at the time of the initial presentation, decision was made to treat his distal tibia fracture  nonsurgically with regard to the tibia as the fibula had already been repaired and the patient's articular alignment was in good position.  He went on to develop a nonunion with progressive pain and limitation in his activities and now presents for definitive repair with iliac crest bone grafting.  The patient was seen and evaluated by his cardiologist and neurologist and they stated that the patient was cleared for surgery.  We discussed risks and benefits of the procedure including the potential for persistent nonunion, infection, nerve injury, vessel injury, and of course stroke and heart attack.  The patient and wife acknowledged these risks and strongly wished to proceed.  BRIEF  SUMMARY OF PROCEDURE:  The patient was taken to the operating room where general anesthesia was induced.  His left lower extremity was prepped and draped in usual sterile fashion as was his left iliac crest. I began by reopening the old traumatic wound by using a Z-type incision to facilitate improved visibility.  I was then able to go directly into the fracture site using a 15 blade and then serial curettes.  The debridement was performed all the way across to the anterolateral cortex toward the fibula.  This material was sent for anaerobic and aerobic culture to microbiology.  Antibiotics were given at that point and we continued until the cavity was completely cleaned back to healthy bleeding bone.  It was then packed and attention turned proximally to the iliac crest.  Here a 4-cm incision was made directly over the pillar and carried down through the amuscular plane to the edge.  Bovie was used to elevate the area of the trapdoor which was made with an osteotome.  The roof of the crest was reflected and then 25 mL of cancellous bone harvested and this was taken and impacted into the nonunion site being sure to fill laterally and working back medially. We had excellent fill in the cavity, and the bone grafts appeared to be of excellent quality.  I then applied a medial plate with the 3 screws distally and 3 screws in the metaphysis, some of these placed with percutaneous technique.  Final images showed excellent alignment and excellent fill of the defect.  [name], PA-C assisted me throughout. There were no complications.  The patient was taken to PACU in stable condition.  PROGNOSIS:  The patient will be nonweightbearing on the left lower extremity.  The iliac crest site which was packed with Gel-Foam and then closed in layered fashion as a Mepilex dressing and this can be removed in several days to shower.  I will plan to see this patient back in the office in 10-14 days for  removal of his sutures.  Overnight stay is anticipated.     Astrid Divine. Marcelino Scot, M.D.     MHH/MEDQ  D:  07/01/2016  T:  07/01/2016  Job:  [id number]

## 2016-07-02 ENCOUNTER — Encounter (HOSPITAL_COMMUNITY)
Admission: RE | Disposition: A | Discharge status: Home / Self Care | Payer: Self-pay | Source: Ambulatory Visit | Attending: Internal Medicine

## 2016-07-02 ENCOUNTER — Ambulatory Visit (HOSPITAL_COMMUNITY)
Admission: RE | Admit: 2016-07-02 | Discharge: 2016-07-02 | Disposition: A | Discharge status: Home / Self Care | Payer: 59 | Source: Ambulatory Visit | Attending: Internal Medicine | Admitting: Internal Medicine

## 2016-07-02 ENCOUNTER — Encounter (HOSPITAL_COMMUNITY): Payer: Self-pay | Admitting: Internal Medicine

## 2016-07-02 DIAGNOSIS — E7800 Pure hypercholesterolemia, unspecified: Secondary | ICD-10-CM | POA: Diagnosis not present

## 2016-07-02 DIAGNOSIS — I639 Cerebral infarction, unspecified: Secondary | ICD-10-CM | POA: Diagnosis not present

## 2016-07-02 DIAGNOSIS — Z959 Presence of cardiac and vascular implant and graft, unspecified: Secondary | ICD-10-CM | POA: Diagnosis present

## 2016-07-02 DIAGNOSIS — Z7901 Long term (current) use of anticoagulants: Secondary | ICD-10-CM | POA: Diagnosis not present

## 2016-07-02 DIAGNOSIS — Q211 Atrial septal defect: Secondary | ICD-10-CM | POA: Diagnosis not present

## 2016-07-02 DIAGNOSIS — Z8249 Family history of ischemic heart disease and other diseases of the circulatory system: Secondary | ICD-10-CM | POA: Insufficient documentation

## 2016-07-02 DIAGNOSIS — Z4509 Encounter for adjustment and management of other cardiac device: Secondary | ICD-10-CM | POA: Insufficient documentation

## 2016-07-02 DIAGNOSIS — Z7982 Long term (current) use of aspirin: Secondary | ICD-10-CM | POA: Insufficient documentation

## 2016-07-02 DIAGNOSIS — Z8673 Personal history of transient ischemic attack (TIA), and cerebral infarction without residual deficits: Secondary | ICD-10-CM | POA: Diagnosis not present

## 2016-07-02 HISTORY — PX: EP IMPLANTABLE DEVICE: SHX172B

## 2016-07-02 SURGERY — LOOP RECORDER REMOVAL
Anesthesia: LOCAL

## 2016-07-02 MED ORDER — LIDOCAINE-EPINEPHRINE 1 %-1:100000 IJ SOLN
INTRAMUSCULAR | Status: DC | PRN
  Administered 2016-07-02: 10 mL via INTRADERMAL

## 2016-07-02 MED ORDER — LIDOCAINE-EPINEPHRINE 1 %-1:100000 IJ SOLN
INTRAMUSCULAR | Status: AC
  Filled 2016-07-02: qty 1

## 2016-07-02 SURGICAL SUPPLY — 1 items: PACK LOOP INSERTION (CUSTOM PROCEDURE TRAY) ×2 IMPLANT

## 2016-07-02 NOTE — Progress Notes (Signed)
Loop instruction form given to pt and wife and discussed prior to procedure and any other instructions were provided by Dr Caryl Comes post procedure with stated understanding.

## 2016-07-02 NOTE — H&P (Signed)
Patient Care Team: [name], MD as PCP - General (Family Medicine)   HPI  Justin [name] is a 65 y.o. male Seen for Loop recorder removal; it is now at [facility]    Implanted 2014 following L MCA CVA;  He had recurrent CVA 6/16 with change in antiplatlet therapy recommended ASA>:>Plavix;  At that time no AFib detected on his device  Echo EF 6/16 normal No iterval symptoms of chest pain sob or neurological issues   Past Medical History:  Diagnosis Date  . Bell's palsy 1980's; [id number]; 2000's; 12/2012; 03/2013   "multiple times" (04/21/2013)  . Chronic anticoagulation 05/30/2016  . Dislocation of left shoulder joint 03/31/2015  . Hypercholesteremia   . 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 PFO (patent foramen ovale)    PFO by 04/24/13 TEE  . Stroke ([place]) 2014   No residual from 2014.  Stroke 03/2015 - Right side effective- "mopst of strenghthas come back."  . Stroke ([place]) 2016  . TIA (transient ischemic attack) 04/21/2013  . Vision abnormalities    right eye vision    Past Surgical History:  Procedure Laterality Date  . COLONOSCOPY N/A 07/13/2013   Procedure: COLONOSCOPY;  Surgeon: Danie Binder, MD;  [facility]: [facility];  Service: Endoscopy;  Laterality: N/A;  8:30 AM  . EP IMPLANTABLE DEVICE N/A 07/02/2016   Procedure: Loop Recorder Removal;  Surgeon: Deboraha Sprang, MD;  Location: [place] CV LAB;  Service: Cardiovascular;  Laterality: N/A;  . EXTERNAL FIXATION LEG Left 03/22/2015   Procedure: EXTERNAL FIXATION left ankle;  Surgeon: Rod Can, MD;  Location: [place];  Service: Orthopedics;  Laterality: Left;  . EXTERNAL FIXATION LEG Left 03/24/2015   Procedure: IRRIGATION AND DEBRIDEMENT  EXTERNAL FIXATURE REVISION LEFT ANKLE ;  Surgeon: Altamese Blanchardville, MD;  Location: [place];  Service: Orthopedics;  Laterality: Left;  . EXTERNAL FIXATION REMOVAL Left 05/23/2015   Procedure: REMOVAL EXTERNAL FIXATION LEFT LEG;  Surgeon: Altamese Alcalde, MD;   Location: [place];  Service: Orthopedics;  Laterality: Left;  . I&D EXTREMITY Left 03/22/2015   Procedure: IRRIGATION AND DEBRIDEMENT OPEN TIBIA -FIBULA  FRACTURE;  Surgeon: Rod Can, MD;  Location: [place];  Service: Orthopedics;  Laterality: Left;  . implanted loop recorder    . LOOP RECORDER IMPLANT N/A 04/24/2013   Procedure: LOOP RECORDER IMPLANT;  Surgeon: Deboraha Sprang, MD;  Location: [facility] CATH LAB;  Service: Cardiovascular;  Laterality: N/A;  . ORIF TIBIA FRACTURE Left 05/29/2016   Procedure: OPEN REDUCTION INTERNAL FIXATION (ORIF)  REPAIR LEFT DISTAL TIBIAL NON UNION ILLIAC CREST BONE GRAFT;  Surgeon: Altamese Barber, MD;  Location: [place];  Service: Orthopedics;  Laterality: Left;  . SHOULDER CLOSED REDUCTION Left 03/22/2015   Procedure: CLOSED REDUCTION SHOULDER;  Surgeon: Rod Can, MD;  Location: [place];  Service: Orthopedics;  Laterality: Left;  . TEE WITHOUT CARDIOVERSION N/A 04/24/2013   Procedure: TRANSESOPHAGEAL ECHOCARDIOGRAM (TEE);  Surgeon: Thayer Headings, MD;  Location: [place];  Service: Cardiovascular;  Laterality: N/A;    No current facility-administered medications for this encounter.    Current Outpatient Prescriptions  Medication Sig Dispense Refill  . aspirin 325 MG tablet Take 325 mg by mouth daily.    . Cholecalciferol (VITAMIN D PO) Take 1 tablet by mouth daily.    . diphenhydrAMINE (BENADRYL) 25 MG tablet Take 25 mg by mouth every 4 (four) hours as needed for itching or allergies.     Marland Kitchen  predniSONE (DELTASONE) 10 MG tablet Take 10 mg by mouth as needed (when the bells palsy fares up). Reported on 12/02/2015    . simvastatin (ZOCOR) 10 MG tablet Take 1 tablet (10 mg total) by mouth at bedtime. 30 tablet 6  . ACYCLOVIR PO Take 800 mg by mouth as needed (when pt bell palsy fares up). Reported on 12/02/2015    . docusate sodium (COLACE) 100 MG capsule Take 1 capsule (100 mg total) by mouth 2 (two) times daily. (Patient not taking: Reported on 06/26/2016) 20 capsule 0   . enoxaparin (LOVENOX) 40 MG/0.4ML injection Inject 0.4 mLs (40 mg total) into the skin daily. (Patient not taking: Reported on 06/26/2016) 12 Syringe 0  . methocarbamol (ROBAXIN) 500 MG tablet Take 1-2 tablets (500-1,000 mg total) by mouth every 6 (six) hours as needed for muscle spasms. (Patient not taking: Reported on 06/26/2016) 80 tablet 0  . oxyCODONE (OXY IR/ROXICODONE) 5 MG immediate release tablet Take 1-2 tablets (5-10 mg total) by mouth every 6 (six) hours as needed for breakthrough pain (take between percocet for breakthrough pain). (Patient not taking: Reported on 06/26/2016) 50 tablet 0  . oxyCODONE-acetaminophen (PERCOCET/ROXICET) 5-325 MG tablet Take 1-2 tablets by mouth every 6 (six) hours as needed for moderate pain or severe pain. (Patient not taking: Reported on 06/26/2016) 80 tablet 0  . polyethylene glycol (MIRALAX / GLYCOLAX) packet Take 17 g by mouth daily. (Patient not taking: Reported on 06/26/2016) 14 each 0    Allergies  Allergen Reactions  . Plavix [Clopidogrel] Swelling and Anxiety    Nocturnal Restlessness, rash TONGUE SWELLING  . Lyrica [Pregabalin] Rash    Edema EDEMA REACTION UNSPECIFIED     Social History  Substance Use Topics  . Smoking status: Never Smoker  . Smokeless tobacco: Never Used  . Alcohol use No     Comment: 03/22/2013 "glass of wine couple times/yr"   Family History  Problem Relation Age of Onset  . Heart disease Mother   . Alcohol abuse Mother   . Colon cancer Neg Hx    No current facility-administered medications on file prior to encounter.    Current Outpatient Prescriptions on File Prior to Encounter  Medication Sig Dispense Refill  . aspirin 325 MG tablet Take 325 mg by mouth daily.    . diphenhydrAMINE (BENADRYL) 25 MG tablet Take 25 mg by mouth every 4 (four) hours as needed for itching or allergies.     . predniSONE (DELTASONE) 10 MG tablet Take 10 mg by mouth as needed (when the bells palsy fares up). Reported on 12/02/2015      . simvastatin (ZOCOR) 10 MG tablet Take 1 tablet (10 mg total) by mouth at bedtime. 30 tablet 6  . ACYCLOVIR PO Take 800 mg by mouth as needed (when pt bell palsy fares up). Reported on 12/02/2015    . docusate sodium (COLACE) 100 MG capsule Take 1 capsule (100 mg total) by mouth 2 (two) times daily. (Patient not taking: Reported on 06/26/2016) 20 capsule 0  . enoxaparin (LOVENOX) 40 MG/0.4ML injection Inject 0.4 mLs (40 mg total) into the skin daily. (Patient not taking: Reported on 06/26/2016) 12 Syringe 0  . methocarbamol (ROBAXIN) 500 MG tablet Take 1-2 tablets (500-1,000 mg total) by mouth every 6 (six) hours as needed for muscle spasms. (Patient not taking: Reported on 06/26/2016) 80 tablet 0  . oxyCODONE (OXY IR/ROXICODONE) 5 MG immediate release tablet Take 1-2 tablets (5-10 mg total) by mouth every 6 (six) hours as needed  for breakthrough pain (take between percocet for breakthrough pain). (Patient not taking: Reported on 06/26/2016) 50 tablet 0  . oxyCODONE-acetaminophen (PERCOCET/ROXICET) 5-325 MG tablet Take 1-2 tablets by mouth every 6 (six) hours as needed for moderate pain or severe pain. (Patient not taking: Reported on 06/26/2016) 80 tablet 0  . polyethylene glycol (MIRALAX / GLYCOLAX) packet Take 17 g by mouth daily. (Patient not taking: Reported on 06/26/2016) 14 each 0      Review of Systems negative except from HPI and PMH  Physical Exam BP (!) 165/98   Pulse (!) 113   Temp 97.8 F (36.6 C) (Oral)   Resp 16   Ht 5\' 9"  (1.753 m)   Wt 180 lb (81.6 kg)   SpO2 98%   BMI 26.58 kg/m  Well developed and nourished in no acute distress HENT normal Neck supple with JVP-flat Clear Regular rate and rhythm, no murmurs or gallops Abd-soft with active BS No Clubbing cyanosis edema Skin-warm and dry A & Oriented  Grossly normal sensory and motor function     Assessment and  Plan  \Loop recorder  For Cryptogenic Stroke  For explant today       Current medicines are  reviewed at length with the patient today .  The patient does not have concerns regarding medicines.

## 2016-07-12 ENCOUNTER — Ambulatory Visit (INDEPENDENT_AMBULATORY_CARE_PROVIDER_SITE_OTHER): Payer: 59 | Admitting: *Deleted

## 2016-07-12 DIAGNOSIS — I639 Cerebral infarction, unspecified: Secondary | ICD-10-CM

## 2016-07-12 NOTE — Progress Notes (Signed)
Linq removal wound check. Steri-Strips removed. Wound well healed, edges approximated. Wound without redness swelling edema. Pt education completed including wound care.

## 2016-07-23 NOTE — Discharge Summary (Signed)
Orthopaedic Trauma Service (OTS)  Patient ID: Justin [name] MRN: [id number] DOB/AGE: [birth year] 65 y.o.  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 Marcelino Scot  Repair of left tibial nonunion with iliac crest bone grafting.   Discharged Condition: good  Hospital Course:   Patient admitted for treatment of his left distal tibia nonunion. Patient underwent the procedure noted above on the date noted above. He was then transferred to the PACU for recovery anesthesia and then transferred to the orthopedic floor for observation overnight. Patient did very well overnight and did not have any issues. He passed physical therapy and was deemed to be stable for discharge on postoperative day #1.   Consults: None  Significant Diagnostic Studies: labs:  Results for VASH, QUEZADA (MRN [id number]) as of 07/23/2016 13:05  Ref. Range 05/30/2016 05:57  Sodium Latest Ref Range: 135 - 145 mmol/L 138  Potassium Latest Ref Range: 3.5 - 5.1 mmol/L 5.1  Chloride Latest Ref Range: 101 - 111 mmol/L 105  CO2 Latest Ref Range: 22 - 32 mmol/L 28  BUN Latest Ref Range: 6 - 20 mg/dL 8  Creatinine Latest Ref Range: 0.61 - 1.24 mg/dL 0.92  Calcium Latest Ref Range: 8.9 - 10.3 mg/dL 9.2  EGFR (Non-African Amer.) Latest Ref Range: >60 mL/min >60  EGFR (African American) Latest Ref Range: >60 mL/min >60  Glucose Latest Ref Range: 65 - 99 mg/dL 109 (H)  Anion gap Latest Ref Range: 5 - 15  5  WBC Latest Ref Range: 4.0 - 10.5 K/uL 8.7  RBC Latest Ref Range: 4.22 - 5.81 MIL/uL 4.77  Hemoglobin Latest Ref Range: 13.0 - 17.0 g/dL 13.1  HCT Latest Ref  Range: 39.0 - 52.0 % 41.7  MCV Latest Ref Range: 78.0 - 100.0 fL 87.4  MCH Latest Ref Range: 26.0 - 34.0 pg 27.5  MCHC Latest Ref Range: 30.0 - 36.0 g/dL 31.4  RDW Latest Ref Range: 11.5 - 15.5 % 14.4  Platelets Latest Ref Range: 150 - 400 K/uL 221    Treatments: IV hydration, antibiotics: Ancef, analgesia: acetaminophen, Dilaudid and percocet, anticoagulation: LMW heparin, therapies: PT and surgery: as above  Discharge Exam:    Orthopaedic Trauma Service (OTS)   Subjective: 1 Day Post-Op Procedure(s) (LRB): OPEN REDUCTION INTERNAL FIXATION (ORIF)  REPAIR LEFT DISTAL TIBIAL NON UNION ILLIAC CREST BONE GRAFT (Left) Patient reports pain as moderate.   C/o increasing pelvic harvest site pain, leg remains at a 5/10; IV meds working well; +N and one episode emesis thought related to oral oxycodone med but as pain has been well treated with Percocet in the past without sxs, he wishes to continue with current regimen a little bit longer before trying a switch to hydrocodone.   Objective: Current Vitals Blood pressure 108/69, pulse 89, temperature 98 F (36.7 C), temperature source Oral, resp. rate 17, height 5' 9"  (1.753 m), weight 84.4 kg (186 lb), SpO2 96 %. Vital signs in last 24 hours: Temp:  [97.4 F (36.3 C)-98.8 F (37.1 C)] 98 F (36.7 C) (08/16 0522) Pulse Rate:  [73-99] 89 (08/16 0522) Resp:  [12-30] 17 (  08/16 0522) BP: (108-147)/(69-96) 108/69 (08/16 0522) SpO2:  [92 %-100 %] 96 % (08/16 0522)   Intake/Output from previous day: 08/15 0701 - 08/16 0700 In: 2948.8 [P.O.:240; I.V.:2608.8; IV Piggyback:100] Out: 2285 [Urine:2135; Blood:150]   LABS  Recent Labs (last 2 labs)     Recent Labs   05/29/16 1649 05/30/16 0557  HGB 14.0 13.1       Recent Labs (last 2 labs)     Recent Labs   05/29/16 1649 05/30/16 0557  WBC 11.4* 8.7  RBC 5.01 4.77  HCT 43.1 41.7  PLT 217 221       Recent Labs (last 2 labs)     Recent Labs   05/29/16 1649 05/30/16 0557  NA   --  138  K  --  5.1  CL  --  105  CO2  --  28  BUN  --  8  CREATININE 0.88 0.92  GLUCOSE  --  109*  CALCIUM  --  9.2      Recent Labs (last 2 labs)   No results for input(s): LABPT, INR in the last 72 hours.     Physical Exam   Lungs clear LLE      Dressings intact, clean, dry             Edema/ swelling controlled             Sens: DPN, SPN, TN intact             Motor: EHL, FHL, and lessor toe ext and flex all intact grossly             Brisk cap refill, warm to touch   Imaging  Imaging Results (Last 48 hours)  Dg Ankle Complete Left   Result Date: 05/29/2016 CLINICAL DATA:  ORIF of left ankle fracture FLUOROSCOPY TIME:  47 seconds Images: 2 EXAM: DG C-ARM 61-120 MIN; LEFT ANKLE COMPLETE - 3+ VIEW COMPARISON:  CT scan March 26, 2016 FINDINGS: Images were obtained during ORIF of left ankle fracture. A rod is seen in the distal fibula, unchanged. Plate and screws canal across the distal tibial fracture. Lucency remains at the fracture site. No other acute abnormalities. IMPRESSION: ORIF of distal left tibial fracture as above. Electronically Signed   By: Dorise Bullion III M.D   On: 05/29/2016 13:53    Dg Ankle Left Port   Result Date: 05/29/2016 CLINICAL DATA:  Repair of ankle fracture EXAM: PORTABLE LEFT ANKLE - 2 VIEW COMPARISON:  May 29, 2016 FINDINGS: A rod extends through the distal fibula with good alignment. A plate is affixed to the medial aspect of the distal tibia. Lucency persists at the fracture site. Hardware is in good position. Alignment is otherwise unremarkable. No other acute abnormalities. IMPRESSION: 1. Left ankle fracture repair as above. Electronically Signed   By: Dorise Bullion III M.D   On: 05/29/2016 13:51    Dg C-arm 61-120 Min   Result Date: 05/29/2016 CLINICAL DATA:  ORIF of left ankle fracture FLUOROSCOPY TIME:  47 seconds Images: 2 EXAM: DG C-ARM 61-120 MIN; LEFT ANKLE COMPLETE - 3+ VIEW COMPARISON:  CT scan March 26, 2016 FINDINGS: Images were  obtained during ORIF of left ankle fracture. A rod is seen in the distal fibula, unchanged. Plate and screws canal across the distal tibial fracture. Lucency remains at the fracture site. No other acute abnormalities. IMPRESSION: ORIF of distal left tibial fracture as above. Electronically Signed   By: Dorise Bullion III  M.D   On: 05/29/2016 13:53       Assessment/Plan: 1 Day Post-Op Procedure(s) (LRB): OPEN REDUCTION INTERNAL FIXATION (ORIF)  REPAIR LEFT DISTAL TIBIAL NON UNION ILLIAC CREST BONE GRAFT (Left)   1. Lovenox and ASA  2. NWB LLE w PT, OT; may be candidate for d/c to home today 3. Cont with oxycodone products for now   Altamese Erie, MD [facility], [facility] [phone] (p)   05/30/2016, 8:49 AM   Disposition: 01-Home or Self Care  Discharge Instructions    Call MD / Call 911    Complete by:  As directed    If you experience chest pain or shortness of breath, CALL 911 and be transported to the hospital emergency room.  If you develope a fever above 101 F, pus (white drainage) or increased drainage or redness at the wound, or calf pain, call your surgeon's office.   Constipation Prevention    Complete by:  As directed    Drink plenty of fluids.  Prune juice may be helpful.  You may use a stool softener, such as Colace (over the counter) 100 mg twice a day.  Use MiraLax (over the counter) for constipation as needed.   Diet - low sodium heart healthy    Complete by:  As directed    Discharge instructions    Complete by:  As directed    Orthopaedic Trauma Service Discharge Instructions   General Discharge Instructions  WEIGHT BEARING STATUS: Nonweightbearing Left leg  RANGE OF MOTION/ACTIVITY: no ankle range of motion as you are in a splint. Keep splint clean and dry. We will remove at follow up appointment   Wound Care: n/a. Keep splint clean and dry   PAIN MEDICATION USE AND EXPECTATIONS  You have likely been given narcotic medications  to help control your pain.  After a traumatic event that results in an fracture (broken bone) with or without surgery, it is ok to use narcotic pain medications to help control one's pain.  We understand that everyone responds to pain differently and each individual patient will be evaluated on a regular basis for the continued need for narcotic medications. Ideally, narcotic medication use should last no more than 6-8 weeks (coinciding with fracture healing).   As a patient it is your responsibility as well to monitor narcotic medication use and report the amount and frequency you use these medications when you come to your office visit.   We would also advise that if you are using narcotic medications, you should take a dose prior to therapy to maximize you participation.  IF YOU ARE ON NARCOTIC MEDICATIONS IT IS NOT PERMISSIBLE TO OPERATE A MOTOR VEHICLE (MOTORCYCLE/CAR/TRUCK/MOPED) OR HEAVY MACHINERY DO NOT MIX NARCOTICS WITH OTHER CNS (CENTRAL NERVOUS SYSTEM) DEPRESSANTS SUCH AS ALCOHOL  Diet: as you were eating previously.  Can use over the counter stool softeners and bowel preparations, such as Miralax, to help with bowel movements.  Narcotics can be constipating.  Be sure to drink plenty of fluids    STOP SMOKING OR USING NICOTINE PRODUCTS!!!!  As discussed nicotine severely impairs your body's ability to heal surgical and traumatic wounds but also impairs bone healing.  Wounds and bone heal by forming microscopic blood vessels (angiogenesis) and nicotine is a vasoconstrictor (essentially, shrinks blood vessels).  Therefore, if vasoconstriction occurs to these microscopic blood vessels they essentially disappear and are unable to deliver necessary nutrients to the healing tissue.  This is one modifiable factor that you can do  to dramatically increase your chances of healing your injury.    (This means no smoking, no nicotine gum, patches, etc)  DO NOT USE NONSTEROIDAL ANTI-INFLAMMATORY DRUGS  (NSAID'S)  Using products such as Advil (ibuprofen), Aleve (naproxen), Motrin (ibuprofen) for additional pain control during fracture healing can delay and/or prevent the healing response.  If you would like to take over the counter (OTC) medication, Tylenol (acetaminophen) is ok.  However, some narcotic medications that are given for pain control contain acetaminophen as well. Therefore, you should not exceed more than 4000 mg of tylenol in a day if you do not have liver disease.  Also note that there are may OTC medicines, such as cold medicines and allergy medicines that my contain tylenol as well.  If you have any questions about medications and/or interactions please ask your doctor/PA or your pharmacist.      ICE AND ELEVATE INJURED/OPERATIVE EXTREMITY  Using ice and elevating the injured extremity above your heart can help with swelling and pain control.  Icing in a pulsatile fashion, such as 20 minutes on and 20 minutes off, can be followed.    Do not place ice directly on skin. Make sure there is a barrier between to skin and the ice pack.    Using frozen items such as frozen peas works well as the conform nicely to the are that needs to be iced.  USE AN ACE WRAP OR TED HOSE FOR SWELLING CONTROL  In addition to icing and elevation, Ace wraps or TED hose are used to help limit and resolve swelling.  It is recommended to use Ace wraps or TED hose until you are informed to stop.    When using Ace Wraps start the wrapping distally (farthest away from the body) and wrap proximally (closer to the body)   Example: If you had surgery on your leg or thing and you do not have a splint on, start the ace wrap at the toes and work your way up to the thigh        If you had surgery on your upper extremity and do not have a splint on, start the ace wrap at your fingers and work your way up to the upper arm  IF YOU ARE IN A SPLINT OR CAST DO NOT [place]   If your splint gets wet for any  reason please contact the office immediately. You may shower in your splint or cast as long as you keep it dry.  This can be done by wrapping in a cast cover or garbage back (or similar)  Do Not stick any thing down your splint or cast such as pencils, money, or hangers to try and scratch yourself with.  If you feel itchy take benadryl as prescribed on the bottle for itching  IF YOU ARE IN A CAM BOOT (BLACK BOOT)  You may remove boot periodically. Perform daily dressing changes as noted below.  Wash the liner of the boot regularly and wear a sock when wearing the boot. It is recommended that you sleep in the boot until told otherwise  CALL THE OFFICE WITH ANY QUESTIONS OR CONCERNS: [phone]   Driving restrictions    Complete by:  As directed    No driving   Increase activity slowly as tolerated    Complete by:  As directed    Non weight bearing    Complete by:  As directed    Laterality:  left   Extremity:  Lower       Medication List    TAKE these medications   ACYCLOVIR PO Take 800 mg by mouth as needed (when pt bell palsy fares up). Reported on 12/02/2015   aspirin 325 MG tablet Take 325 mg by mouth daily.   BENADRYL 25 MG tablet Generic drug:  diphenhydrAMINE Take 25 mg by mouth every 4 (four) hours as needed for itching or allergies.   docusate sodium 100 MG capsule Commonly known as:  COLACE Take 1 capsule (100 mg total) by mouth 2 (two) times daily.   enoxaparin 40 MG/0.4ML injection Commonly known as:  LOVENOX Inject 0.4 mLs (40 mg total) into the skin daily.   methocarbamol 500 MG tablet Commonly known as:  ROBAXIN Take 1-2 tablets (500-1,000 mg total) by mouth every 6 (six) hours as needed for muscle spasms.   oxyCODONE 5 MG immediate release tablet Commonly known as:  Oxy IR/ROXICODONE Take 1-2 tablets (5-10 mg total) by mouth every 6 (six) hours as needed for breakthrough pain (take between percocet for breakthrough pain).   oxyCODONE-acetaminophen 5-325  MG tablet Commonly known as:  PERCOCET/ROXICET Take 1-2 tablets by mouth every 6 (six) hours as needed for moderate pain or severe pain.   polyethylene glycol packet Commonly known as:  MIRALAX / GLYCOLAX Take 17 g by mouth daily.   predniSONE 10 MG tablet Commonly known as:  DELTASONE Take 10 mg by mouth as needed (when the bells palsy fares up). Reported on 12/02/2015   simvastatin 10 MG tablet Commonly known as:  ZOCOR Take 1 tablet (10 mg total) by mouth at bedtime.      Follow-up Information    HANDY,MICHAEL H, MD. Schedule an appointment as soon as possible for a visit in 2 week(s).   Specialty:  Orthopedic Surgery Why:  splint an suture removal  Contact information: [place] [address] [phone]           Signed:  [name], PA-C [facility] [phone] (P) 07/23/2016, 1:04 PM

## 2016-08-16 ENCOUNTER — Encounter (HOSPITAL_COMMUNITY): Payer: Self-pay

## 2016-08-16 ENCOUNTER — Emergency Department (HOSPITAL_COMMUNITY)
Admission: EM | Admit: 2016-08-16 | Discharge: 2016-08-16 | Disposition: A | Discharge status: Home / Self Care | Payer: 59 | Attending: Emergency Medicine | Admitting: Emergency Medicine

## 2016-08-16 DIAGNOSIS — R221 Localized swelling, mass and lump, neck: Secondary | ICD-10-CM | POA: Diagnosis present

## 2016-08-16 DIAGNOSIS — T783XXA Angioneurotic edema, initial encounter: Secondary | ICD-10-CM | POA: Diagnosis not present

## 2016-08-16 DIAGNOSIS — Z7982 Long term (current) use of aspirin: Secondary | ICD-10-CM | POA: Insufficient documentation

## 2016-08-16 DIAGNOSIS — Z8673 Personal history of transient ischemic attack (TIA), and cerebral infarction without residual deficits: Secondary | ICD-10-CM | POA: Insufficient documentation

## 2016-08-16 LAB — CBC WITH DIFFERENTIAL/PLATELET
Basophils Absolute: 0 10*3/uL (ref 0.0–0.1)
Basophils Relative: 0 %
Eosinophils Absolute: 0.2 10*3/uL (ref 0.0–0.7)
Eosinophils Relative: 2 %
HCT: 45.1 % (ref 39.0–52.0)
Hemoglobin: 15 g/dL (ref 13.0–17.0)
Lymphocytes Relative: 28 %
Lymphs Abs: 2.7 10*3/uL (ref 0.7–4.0)
MCH: 28.6 pg (ref 26.0–34.0)
MCHC: 33.3 g/dL (ref 30.0–36.0)
MCV: 86.1 fL (ref 78.0–100.0)
Monocytes Absolute: 0.6 10*3/uL (ref 0.1–1.0)
Monocytes Relative: 6 %
Neutro Abs: 6.1 10*3/uL (ref 1.7–7.7)
Neutrophils Relative %: 64 %
Platelets: 237 10*3/uL (ref 150–400)
RBC: 5.24 MIL/uL (ref 4.22–5.81)
RDW: 14.3 % (ref 11.5–15.5)
WBC: 9.6 10*3/uL (ref 4.0–10.5)

## 2016-08-16 LAB — BASIC METABOLIC PANEL
Anion gap: 6 (ref 5–15)
BUN: 19 mg/dL (ref 6–20)
CO2: 26 mmol/L (ref 22–32)
Calcium: 9.6 mg/dL (ref 8.9–10.3)
Chloride: 108 mmol/L (ref 101–111)
Creatinine, Ser: 1.02 mg/dL (ref 0.61–1.24)
GFR calc Af Amer: >60 mL/min (ref >60)
GFR calc non Af Amer: >60 mL/min (ref >60)
Glucose, Bld: 114 mg/dL — ABNORMAL HIGH (ref 65–99)
Potassium: 3.6 mmol/L (ref 3.5–5.1)
Sodium: 140 mmol/L (ref 135–145)

## 2016-08-16 MED ORDER — FAMOTIDINE IN NACL 20-0.9 MG/50ML-% IV SOLN
20.0000 mg | Freq: Once | INTRAVENOUS | Status: AC
  Administered 2016-08-16: 20 mg via INTRAVENOUS
  Filled 2016-08-16: qty 50

## 2016-08-16 MED ORDER — DIPHENHYDRAMINE HCL 50 MG/ML IJ SOLN
50.0000 mg | Freq: Once | INTRAMUSCULAR | Status: AC
  Administered 2016-08-16: 50 mg via INTRAVENOUS
  Filled 2016-08-16: qty 1

## 2016-08-16 MED ORDER — SODIUM CHLORIDE 0.9 % IV SOLN
INTRAVENOUS | Status: DC
  Administered 2016-08-16: 01:00:00 via INTRAVENOUS

## 2016-08-16 MED ORDER — METHYLPREDNISOLONE SODIUM SUCC 125 MG IJ SOLR
125.0000 mg | Freq: Once | INTRAMUSCULAR | Status: AC
  Administered 2016-08-16: 125 mg via INTRAVENOUS
  Filled 2016-08-16: qty 2

## 2016-08-16 NOTE — ED Triage Notes (Signed)
Oral and facial swelling onset approx 10 pm, took 2 benadryls at that time without relief.  Pt's tongue is extremely swollen, has difficulty with his saliva, reports mild sob with same

## 2016-08-16 NOTE — ED Notes (Signed)
Swelling to tongue has decreased.  Pt states he can notice a difference

## 2016-08-16 NOTE — ED Provider Notes (Signed)
[facility] DEPT Provider Note   CSN: [id number] Arrival date & time: 08/16/16  0012  By signing my name below, I, Dora Sims, attest that this documentation has been prepared under the direction and in the presence of physician practitioner, Virgel Manifold, MD. Electronically Signed: Dora Sims, Scribe. 08/16/2016. 12:28 AM.  History   Chief Complaint Chief Complaint  Patient presents with  . Allergic Reaction    The history is provided by the patient. No language interpreter was used.     HPI Comments: Justin [name] is a 65 y.o. male who presents to the Emergency Department complaining of sudden onset, constant, oral and facial swelling beginning around 10 PM last night. Pt reports his throat swelling is most significant and he endorses associated SOB. He took benadryl x2 shortly after his throat and face started swelling with no relief of his symptoms. He states he is "barely" able to swallow. He notes he felt fine prior to the allergic reaction. He denies using new medications or eating new foods recently. He denies h/o HTN. Pt further denies nausea, vomiting, abdominal pain, diarrhea, lightheadedness, itching, rash, or any other associated symptoms.  Past Medical History:  Diagnosis Date  . Bell's palsy 1980's; [id number]; 2000's; 12/2012; 03/2013   "multiple times" (04/21/2013)  . Chronic anticoagulation 05/30/2016  . Dislocation of left shoulder joint 03/31/2015  . Hypercholesteremia   . 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 PFO (patent foramen ovale)    PFO by 04/24/13 TEE  . Stroke ([place]) 2014   No residual from 2014.  Stroke 03/2015 - Right side effective- "mopst of strenghthas come back."  . Stroke ([place]) 2016  . TIA (transient ischemic attack) 04/21/2013  . Vision abnormalities    right eye vision    Patient Active Problem List   Diagnosis Date Noted  . 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 Chronic  anticoagulation 05/30/2016  . Open fracture of distal tibia 05/29/2016  . Cryptogenic stroke ([place]) 07/25/2015  . Dislocation of left shoulder joint 03/31/2015  . 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 Cerebral infarction due to embolism of cerebral artery ([place])   . Embolic stroke ([place])   . PFO (patent foramen ovale)   . Open displaced pilon fracture of left tibia, type III 03/23/2015  . Open fracture of left tibia and fibula 03/22/2015  . Cardiac device in situ 08/04/2013  . Mild hyperlipidemia 05/11/2013  . Colon cancer screening 05/11/2013  . H/O: CVA (cerebrovascular accident) 04/22/2013  . Bell's palsy 04/22/2013    Past Surgical History:  Procedure Laterality Date  . COLONOSCOPY N/A 07/13/2013   Procedure: COLONOSCOPY;  Surgeon: Danie Binder, MD;  [facility];  Service: Endoscopy;  Laterality: N/A;  8:30 AM  . EP IMPLANTABLE DEVICE N/A 07/02/2016   Procedure: Loop Recorder Removal;  Surgeon: Deboraha Sprang, MD;  Location: [facility];  Service: Cardiovascular;  Laterality: N/A;  . EXTERNAL FIXATION LEG Left 03/22/2015   Procedure: EXTERNAL FIXATION left ankle;  Surgeon: Rod Can, MD;  Location: [facility];  Service: Orthopedics;  Laterality: Left;  . EXTERNAL FIXATION LEG Left 03/24/2015   Procedure: IRRIGATION AND DEBRIDEMENT  EXTERNAL FIXATURE REVISION LEFT ANKLE ;  Surgeon: Altamese Haiku-Pauwela, MD;  Location: [facility];  Service: Orthopedics;  Laterality: Left;  . EXTERNAL FIXATION REMOVAL Left 05/23/2015   Procedure: REMOVAL EXTERNAL FIXATION LEFT LEG;  Surgeon: Altamese [place], MD;  Location: [facility]  OR;  Service: Orthopedics;  Laterality: Left;  . I&D EXTREMITY Left 03/22/2015   Procedure: IRRIGATION AND DEBRIDEMENT OPEN TIBIA -FIBULA  FRACTURE;  Surgeon: Rod Can, MD;  Location: [facility];  Service: Orthopedics;  Laterality: Left;  . implanted loop recorder    . LOOP RECORDER IMPLANT N/A 04/24/2013   Procedure: LOOP RECORDER IMPLANT;  Surgeon: Deboraha Sprang, MD;   Location: [facility] CATH LAB;  Service: Cardiovascular;  Laterality: N/A;  . ORIF TIBIA FRACTURE Left 05/29/2016   Procedure: OPEN REDUCTION INTERNAL FIXATION (ORIF)  REPAIR LEFT DISTAL TIBIAL NON UNION ILLIAC CREST BONE GRAFT;  Surgeon: Altamese [place], MD;  Location: [facility];  Service: Orthopedics;  Laterality: Left;  . SHOULDER CLOSED REDUCTION Left 03/22/2015   Procedure: CLOSED REDUCTION SHOULDER;  Surgeon: Rod Can, MD;  Location: [place];  Service: Orthopedics;  Laterality: Left;  . TEE WITHOUT CARDIOVERSION N/A 04/24/2013   Procedure: TRANSESOPHAGEAL ECHOCARDIOGRAM (TEE);  Surgeon: Thayer Headings, MD;  Location: [place];  Service: Cardiovascular;  Laterality: N/A;       Home Medications    Prior to Admission medications   Medication Sig Start Date End Date Taking? Authorizing Provider  aspirin 325 MG tablet Take 325 mg by mouth daily.   Yes Historical Provider, MD  simvastatin (ZOCOR) 10 MG tablet Take 1 tablet (10 mg total) by mouth at bedtime. 01/10/16  Yes [name], MD  ACYCLOVIR PO Take 800 mg by mouth as needed (when pt bell palsy fares up). Reported on 12/02/2015    Historical Provider, MD  Cholecalciferol (VITAMIN D PO) Take 1 tablet by mouth daily.    Historical Provider, MD  diphenhydrAMINE (BENADRYL) 25 MG tablet Take 25 mg by mouth every 4 (four) hours as needed for itching or allergies.     Historical Provider, MD  docusate sodium (COLACE) 100 MG capsule Take 1 capsule (100 mg total) by mouth 2 (two) times daily. Patient not taking: Reported on 07/12/2016 05/30/16   [name], PA-C  enoxaparin (LOVENOX) 40 MG/0.4ML injection Inject 0.4 mLs (40 mg total) into the skin daily. Patient not taking: Reported on 07/12/2016 05/30/16   [name], PA-C  methocarbamol (ROBAXIN) 500 MG tablet Take 1-2 tablets (500-1,000 mg total) by mouth every 6 (six) hours as needed for muscle spasms. Patient not taking: Reported on 07/12/2016 05/30/16   [name], PA-C  oxyCODONE (OXY  IR/ROXICODONE) 5 MG immediate release tablet Take 1-2 tablets (5-10 mg total) by mouth every 6 (six) hours as needed for breakthrough pain (take between percocet for breakthrough pain). Patient not taking: Reported on 07/12/2016 05/30/16   [name], PA-C  oxyCODONE-acetaminophen (PERCOCET/ROXICET) 5-325 MG tablet Take 1-2 tablets by mouth every 6 (six) hours as needed for moderate pain or severe pain. Patient not taking: Reported on 07/12/2016 05/30/16   [name], PA-C  polyethylene glycol [facility] / Floria Raveling) packet Take 17 g by mouth daily. Patient not taking: Reported on 07/12/2016 05/30/16   [name], PA-C  predniSONE (DELTASONE) 10 MG tablet Take 10 mg by mouth as needed (when the bells palsy fares up). Reported on 12/02/2015    Historical Provider, MD    Family History Family History  Problem Relation Age of Onset  . Heart disease Mother   . Alcohol abuse Mother   . Colon cancer Neg Hx     Social History Social History  Substance Use Topics  . Smoking status: Never Smoker  . Smokeless tobacco: Never Used  . Alcohol use No  Comment: 03/22/2013 "glass of wine couple times/yr"     Allergies   Plavix [clopidogrel] and Lyrica [pregabalin]   Review of Systems Review of Systems  A complete 10 system review of systems was obtained and all systems are negative except as noted in the HPI and PMH.   Physical Exam Updated Vital Signs There were no vitals taken for this visit.  Physical Exam  Constitutional: He is oriented to person, place, and time. He appears well-developed and well-nourished. No distress.  HENT:  Head: Normocephalic and atraumatic.  Right Ear: Hearing normal.  Left Ear: Hearing normal.  Nose: Nose normal.  Mouth/Throat: Mucous membranes are normal.  Severe tongue swelling. Could barely visualize upper portion of soft palette. Submental tissues are stretched to the point of mildly cracking. Voice is muffled.   Eyes: Conjunctivae and EOM are normal. Pupils  are equal, round, and reactive to light.  Neck: Normal range of motion. Neck supple.  Cardiovascular: Regular rhythm, S1 normal and S2 normal.  Exam reveals no gallop and no friction rub.   No murmur heard. Pulmonary/Chest: Effort normal and breath sounds normal. No respiratory distress. He exhibits no tenderness.  Abdominal: Soft. Normal appearance and bowel sounds are normal. There is no hepatosplenomegaly. There is no tenderness. There is no rebound, no guarding, no tenderness at McBurney's point and negative Murphy's sign. No hernia.  Musculoskeletal: Normal range of motion.  Neurological: He is alert and oriented to person, place, and time. He has normal strength. No cranial nerve deficit or sensory deficit. Coordination normal. GCS eye subscore is 4. GCS verbal subscore is 5. GCS motor subscore is 6.  Skin: Skin is warm, dry and intact. No rash noted. No cyanosis.  Psychiatric: He has a normal mood and affect. His speech is normal and behavior is normal. Thought content normal.  Nursing note and vitals reviewed.   ED Treatments / Results  Labs (all labs ordered are listed, but only abnormal results are displayed) Labs Reviewed  CBC WITH DIFFERENTIAL/PLATELET  BASIC METABOLIC PANEL    EKG  EKG Interpretation None       Radiology No results found.  Procedures Procedures (including critical care time)  DIAGNOSTIC STUDIES: Oxygen Saturation is 100% on RA, normal by my interpretation.    COORDINATION OF CARE: 12:32 AM Discussed treatment plan with pt at bedside and pt agreed to plan.  Medications Ordered in ED Medications  0.9 %  sodium chloride infusion (not administered)  famotidine (PEPCID) IVPB 20 mg premix (not administered)  diphenhydrAMINE (BENADRYL) injection 50 mg (not administered)  methylPREDNISolone sodium succinate (SOLU-MEDROL) 125 mg/2 mL injection 125 mg (not administered)     Initial Impression / Assessment and Plan / ED Course  I have reviewed the  triage vital signs and the nursing notes.  Pertinent labs & imaging results that were available during my care of the patient were reviewed by me and considered in my medical decision making (see chart for details).  Clinical Course   12:36 AM 65yM with pretty severe tongue swelling. No protruding from mouth, but cannot completely close mouth, has some voice changes and mild pooling of secretions. Symptom onset around 10pm and reports has been stable since taking benadryl. IV placed. Additional antihistamines and steroids. Will move to room 2 which can better accommodate procedures if needed.  1:19 AM Mild, but noticeable improvement. He can close mouth. He subjectively feels like swelling is decreasing.   2:03 AM No new complaints. Symptoms stable.   2:48 AM Swelling  continues to improve. Speech more clear.    Final Clinical Impressions(s) / ED Diagnoses   Final diagnoses:  Angioedema, initial encounter    New Prescriptions New Prescriptions   No medications on file     Virgel Manifold, MD 08/20/16 1101

## 2016-08-16 NOTE — ED Notes (Signed)
Swelling to tongue has decreased significantly,  Voice is no longer muffled and pt states he feels comfortable going home now

## 2016-08-20 ENCOUNTER — Encounter: Payer: Self-pay | Admitting: Family Medicine

## 2016-08-20 ENCOUNTER — Ambulatory Visit (INDEPENDENT_AMBULATORY_CARE_PROVIDER_SITE_OTHER): Payer: 59 | Admitting: Family Medicine

## 2016-08-20 VITALS — BP 128/82 | HR 88 | Temp 98.2°F | Resp 18 | Wt 186.0 lb

## 2016-08-20 DIAGNOSIS — L500 Allergic urticaria: Secondary | ICD-10-CM

## 2016-08-20 DIAGNOSIS — T783XXD Angioneurotic edema, subsequent encounter: Secondary | ICD-10-CM

## 2016-08-20 MED ORDER — PREDNISONE 10 MG PO TABS
ORAL_TABLET | ORAL | 0 refills | Status: DC
Start: 2016-08-20 — End: 2019-02-10

## 2016-08-20 NOTE — Progress Notes (Signed)
   Subjective:    Patient ID: Justin [name], male    DOB: 18-Aug-1951, 65 y.o.   MRN: [id number]  Patient presents for ED follow up (swelling in mouth and throat  ?? reason,  pt thinks reaction to simvastatin)  Here for ER follow-up. He was seen in the emergency room after angioedema acutely he began having tongue swelling followed by lip swelling facial swelling he was given IV Solu-Medrol, Benadryl and Pepcid and his symptoms did resolve in the emergency room. He took Benadryl every 6 hours for the past few days since all the cell swelling has resolved. He's had multiple episodes over enough part of his body swelling his hands his eyelids his face he's also had random episodes of hives that come and go. The past couple times it occurred after anesthesia where he was having intervention done on his left leg he has titanium plate and a bone graft secondary to an injury a year ago. He also had allergic reaction to Plavix and Lyrica. The only medication he has been consistently on his aspirin and simvastatin which his wife did stop the simvastatin last week.  Denies any new food soap detergent. He's never had any severe allergic reactions in his childhood.      Review Of Systems:  GEN- denies fatigue, fever, weight loss,weakness, recent illness HEENT- denies eye drainage, change in vision, nasal discharge, CVS- denies chest pain, palpitations RESP- denies SOB, cough, wheeze ABD- denies N/V, change in stools, abd pain GU- denies dysuria, hematuria, dribbling, incontinence MSK- denies joint pain, muscle aches, injury Neuro- denies headache, dizziness, syncope, seizure activity       Objective:    BP 128/82 (BP Location: Right Arm, Patient Position: Sitting, Cuff Size: Normal)   Pulse 88   Temp 98.2 F (36.8 C) (Oral)   Resp 18   Wt 186 lb (84.4 kg)   BMI 27.47 kg/m  GEN- NAD, alert and oriented x3 HEENT- PERRL, EOMI, non injected sclera, pink conjunctiva, MMM, oropharynx clear, uvula  midline Neck- Supple,no LAD  CVS- RRR, no murmur RESP-CTAB Skin- no erythema,in tact  EXT- No edema         Assessment & Plan:      Problem List Items Addressed This Visit    None    Visit Diagnoses    Angioedema, subsequent encounter    -  Primary   Recurrent episodes of urticaria, angioedema, swelling/allergic reaction, unknown cause. Simvastatin discontinued. Okay to stop benadryl, but given Prednisone in case of recurrence Possibly due to statin, but at times of surgey not taking medication.  He will need allergy testing, therefore will not put on daily anti-histamine so it will not interfere.    Relevant Orders   Ambulatory referral to Allergy   Allergic urticaria       Relevant Orders   Ambulatory referral to Allergy      Note: This dictation was prepared with Dragon dictation along with smaller phrase technology. Any transcriptional errors that result from this process are unintentional.

## 2016-08-20 NOTE — Patient Instructions (Signed)
[facility] Allergy specialist  F/U pending results

## 2016-10-05 ENCOUNTER — Encounter: Payer: Self-pay | Admitting: Family Medicine

## 2016-10-20 ENCOUNTER — Encounter: Payer: Self-pay | Admitting: Family Medicine

## 2016-11-13 ENCOUNTER — Ambulatory Visit (INDEPENDENT_AMBULATORY_CARE_PROVIDER_SITE_OTHER): Payer: 59 | Admitting: Allergy and Immunology

## 2016-11-13 ENCOUNTER — Encounter: Payer: Self-pay | Admitting: Allergy and Immunology

## 2016-11-13 VITALS — BP 126/78 | HR 90 | Temp 97.7°F | Resp 14 | Ht 68.0 in | Wt 184.0 lb

## 2016-11-13 DIAGNOSIS — J310 Chronic rhinitis: Secondary | ICD-10-CM | POA: Diagnosis not present

## 2016-11-13 DIAGNOSIS — T783XXA Angioneurotic edema, initial encounter: Secondary | ICD-10-CM | POA: Diagnosis not present

## 2016-11-13 DIAGNOSIS — L500 Allergic urticaria: Secondary | ICD-10-CM | POA: Insufficient documentation

## 2016-11-13 LAB — COMPLETE METABOLIC PANEL WITH GFR
ALT: 14 U/L (ref 9–46)
AST: 17 U/L (ref 10–35)
Albumin: 4.5 g/dL (ref 3.6–5.1)
Alkaline Phosphatase: 91 U/L (ref 40–115)
BUN: 14 mg/dL (ref 7–25)
CO2: 21 mmol/L (ref 20–31)
Calcium: 10 mg/dL (ref 8.6–10.3)
Chloride: 107 mmol/L (ref 98–110)
Creat: 1.03 mg/dL (ref 0.70–1.25)
GFR, Est African American: 89 mL/min (ref >=60)
GFR, Est Non African American: 77 mL/min (ref >=60)
Glucose, Bld: 105 mg/dL — ABNORMAL HIGH (ref 65–99)
Potassium: 4.6 mmol/L (ref 3.5–5.3)
Sodium: 141 mmol/L (ref 135–146)
Total Bilirubin: 0.6 mg/dL (ref 0.2–1.2)
Total Protein: 7.5 g/dL (ref 6.1–8.1)

## 2016-11-13 LAB — CBC WITH DIFFERENTIAL/PLATELET
Basophils Absolute: 0 cells/uL (ref 0–200)
Basophils Relative: 0 %
Eosinophils Absolute: 66 cells/uL (ref 15–500)
Eosinophils Relative: 1 %
HCT: 47.9 % (ref 38.5–50.0)
Hemoglobin: 15.6 g/dL (ref 13.2–17.1)
Lymphocytes Relative: 26 %
Lymphs Abs: 1716 cells/uL (ref 850–3900)
MCH: 27.9 pg (ref 27.0–33.0)
MCHC: 32.6 g/dL (ref 32.0–36.0)
MCV: 85.5 fL (ref 80.0–100.0)
MPV: 9.5 fL (ref 7.5–12.5)
Monocytes Absolute: 396 cells/uL (ref 200–950)
Monocytes Relative: 6 %
Neutro Abs: 4422 cells/uL (ref 1500–7800)
Neutrophils Relative %: 67 %
Platelets: 224 10*3/uL (ref 140–400)
RBC: 5.6 MIL/uL (ref 4.20–5.80)
RDW: 14.1 % (ref 11.0–15.0)
WBC: 6.6 10*3/uL (ref 3.8–10.8)

## 2016-11-13 MED ORDER — LEVOCETIRIZINE DIHYDROCHLORIDE 5 MG PO TABS: 5.0000 mg | ORAL_TABLET | Freq: Every day | ORAL | 5 refills | Status: DC | PRN

## 2016-11-13 NOTE — Assessment & Plan Note (Signed)
Associated angioedema occurs in up to 50% of patients with chronic urticaria.  Treatment/diagnostic plan as outlined above. 

## 2016-11-13 NOTE — Progress Notes (Signed)
New Patient Note  RE: Justin [name] MRN: [id number] DOB: 09-12-1951 Date of Office Visit: 11/13/2016  Referring provider: [name], MD Primary care provider: [name], MD  Chief Complaint: Urticaria and Angioedema   History of present illness: Justin [name] is a 66 y.o. male seen today in consultation requested by [name], MD.  He complains of recurrent urticaria which started in June 2016 shortly after hospitalization for tib-fib internal fixation.  Approximately 2 weeks after he was discharged from the hospital he began to develop hives on his arms and torso.  He also experienced some mild swelling of his fingers.  The hives are described as erythematous raised and pruritic, resolving within 24 hours without residual pigmentation or bruising.  The hives "came and went", occasionally associated with minor swelling of the eyelids and/or tongue.  He denies concomitant cardiopulmonary or other GI symptoms.  On 08/15/2016, he developed angioedema of the tongue which prompted him to seek evaluation and treatment in the emergency department.  He was treated with intravenous corticosteroids and antihistamines.  He did not experience concomitant cardiopulmonary or GI symptoms.  Initially, it was thought that the urticaria and angioedema were caused by a drug reaction to Plavix which he had received while hospitalized in June 2016. No specific food, skin care product, detergent, soap, or other environmental triggers have been identified.  He has had no recurrence of urticaria or angioedema since November 2017.  He experiences occasional nasal congestion, rhinorrhea, or postnasal drainage. No significant seasonal symptom variation has been noted nor have specific environmental triggers been identified.   Assessment and plan: Recurrent urticaria Unclear etiology.  His history suggests the possibility of drug reaction to Plavix, however the duration of his symptoms after having  discontinued this medication makes this less likely.  Certainly, the trauma emotional stress accompanying the trauma of his hospitalization in June 2016 may have caused mast cell destabilization.  Skin tests to select food allergens were negative today. NSAIDs and emotional stress commonly exacerbate urticaria but are not the underlying etiology in this case. Physical urticarias are negative by history (i.e. pressure-induced, temperature, vibration, solar, etc.). History and lesions are not consistent with urticaria pigmentosa so I am not suspicious for mastocytosis. There are no concomitant symptoms concerning for anaphylaxis or constitutional symptoms worrisome for an underlying malignancy. We will rule out other potential etiologies with labs. For symptom relief, patient is to take oral antihistamines as directed.  The following labs have been ordered: FCeRI antibody, TSH, anti-thyroglobulin antibody, thyroid peroxidase antibody, tryptase, C4, CBC, CMP, and galactose-alpha-1,3-galactose IgE level.  The patient will be called with further recommendations after lab results have returned.  Instructions have been discussed and provided for H1/H2 receptor blockade with titration to find lowest effective dose.  A prescription has been provided for levocetirizine, 5mg  daily as needed.  A journal is to be kept recording any foods eaten, beverages consumed, medications taken within a 6 hour period prior to the onset of symptoms, as well as record activities being performed, and environmental conditions. For any symptoms concerning for anaphylaxis, 911 is to be called immediately.  Angioedema Associated angioedema occurs in up to 50% of patients with chronic urticaria.  Treatment/diagnostic plan as outlined above.  Nonallergic rhinitis Perennial and seasonal aeroallergens skin tests were negative today despite a positive histamine control.  Over-the-counter fluticasone nasal spray or triamcinolone nasal  spray as needed.   Meds ordered this encounter  Medications  . levocetirizine (XYZAL) 5 MG tablet  Sig: Take 1 tablet (5 mg total) by mouth daily as needed for allergies.    Dispense:  30 tablet    Refill:  5    Diagnostics: Environmental skin testing:  Negative despite a positive histamine control. Food allergen skin testing:  Negative despite a positive histamine control.    Physical examination: Blood pressure 126/78, pulse 90, temperature 97.7 F (36.5 C), temperature source Oral, resp. rate 14, height 5\' 8"  (1.727 m), weight 184 lb (83.5 kg), SpO2 96 %.  General: Alert, interactive, in no acute distress. HEENT: TMs pearly gray, turbinates minimally edematous without discharge, post-pharynx unremarkable. Neck: Supple without lymphadenopathy. Lungs: Clear to auscultation without wheezing, rhonchi or rales. CV: Normal S1, S2 without murmurs. Abdomen: Nondistended, nontender. Skin: Warm and dry, without lesions or rashes. Extremities:  No clubbing, cyanosis or edema. Neuro:   Grossly intact.  Review of systems:  Review of systems negative except as noted in HPI / PMHx or noted below: Review of Systems  Constitutional: Negative.   HENT: Negative.   Eyes: Negative.   Respiratory: Negative.   Cardiovascular: Negative.   Gastrointestinal: Negative.   Genitourinary: Negative.   Musculoskeletal: Negative.   Skin: Negative.   Neurological: Negative.   Endo/Heme/Allergies: Negative.   Psychiatric/Behavioral: Negative.     Past medical history:  Past Medical History:  Diagnosis Date  . Angio-edema   . Bell's palsy 1980's; [id number]; 2000's; 12/2012; 03/2013   "multiple times" (04/21/2013)  . Chronic anticoagulation 05/30/2016  . Dislocation of left shoulder joint 03/31/2015  . Hypercholesteremia   . 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 PFO (patent foramen ovale)    PFO by 04/24/13 TEE  . Stroke ([place]) 2014   No residual from 2014.  Stroke  03/2015 - Right side effective- "mopst of strenghthas come back."  . Stroke ([place]) 2016  . TIA (transient ischemic attack) 04/21/2013  . Urticaria   . Vision abnormalities    right eye vision    Past surgical history:  Past Surgical History:  Procedure Laterality Date  . COLONOSCOPY N/A 07/13/2013   Procedure: COLONOSCOPY;  Surgeon: Danie Binder, MD;  [facility];  Service: Endoscopy;  Laterality: N/A;  8:30 AM  . EP IMPLANTABLE DEVICE N/A 07/02/2016   Procedure: Loop Recorder Removal;  Surgeon: Deboraha Sprang, MD;  Location: [facility];  Service: Cardiovascular;  Laterality: N/A;  . EXTERNAL FIXATION LEG Left 03/22/2015   Procedure: EXTERNAL FIXATION left ankle;  Surgeon: Rod Can, MD;  Location: [facility];  Service: Orthopedics;  Laterality: Left;  . EXTERNAL FIXATION LEG Left 03/24/2015   Procedure: IRRIGATION AND DEBRIDEMENT  EXTERNAL FIXATURE REVISION LEFT ANKLE ;  Surgeon: Altamese Kathleen, MD;  Location: [facility];  Service: Orthopedics;  Laterality: Left;  . EXTERNAL FIXATION REMOVAL Left 05/23/2015   Procedure: REMOVAL EXTERNAL FIXATION LEFT LEG;  Surgeon: Altamese , MD;  Location: [facility];  Service: Orthopedics;  Laterality: Left;  . I&D EXTREMITY Left 03/22/2015   Procedure: IRRIGATION AND DEBRIDEMENT OPEN TIBIA -FIBULA  FRACTURE;  Surgeon: Rod Can, MD;  Location: [facility];  Service: Orthopedics;  Laterality: Left;  . implanted loop recorder    . LOOP RECORDER IMPLANT N/A 04/24/2013   Procedure: LOOP RECORDER IMPLANT;  Surgeon: Deboraha Sprang, MD;  Location: [facility] CATH LAB;  Service: Cardiovascular;  Laterality: N/A;  . ORIF TIBIA FRACTURE Left 05/29/2016   Procedure: OPEN REDUCTION INTERNAL FIXATION (ORIF)  REPAIR LEFT DISTAL TIBIAL NON UNION  ILLIAC CREST BONE GRAFT;  Surgeon: Altamese [place], MD;  Location: [facility];  Service: Orthopedics;  Laterality: Left;  . SHOULDER CLOSED REDUCTION Left 03/22/2015   Procedure: CLOSED REDUCTION SHOULDER;  Surgeon: Rod Can, MD;   Location: [place];  Service: Orthopedics;  Laterality: Left;  . TEE WITHOUT CARDIOVERSION N/A 04/24/2013   Procedure: TRANSESOPHAGEAL ECHOCARDIOGRAM (TEE);  Surgeon: Thayer Headings, MD;  Location: [facility] ENDOSCOPY;  Service: Cardiovascular;  Laterality: N/A;    Family history: Family History  Problem Relation Age of Onset  . Heart disease Mother   . Alcohol abuse Mother   . Colon cancer Neg Hx     Social history: Social History   Social History  . Marital status: Married    Spouse name: N/A  . Number of children: 3  . Years of education: college   Occupational History  . [employer]   Social History Main Topics  . Smoking status: Never Smoker  . Smokeless tobacco: Never Used  . Alcohol use No     Comment: 03/22/2013 "glass of wine couple times/yr"  . Drug use: No  . Sexual activity: Yes   Other Topics Concern  . Not on file   Social History Narrative  . No narrative on file   Environmental History: The patient lives in a 64 year old house with hardwood floors throughout, gas heat, and central air.  There are dogs in the house which have access to his bedroom.  He is a nonsmoker and is not exposed to significant secondhand cigarette smoke.  Allergies as of 11/13/2016      Reactions   Plavix [clopidogrel] Swelling, Anxiety   Nocturnal Restlessness, rash TONGUE SWELLING   Lyrica [pregabalin] Rash   Edema EDEMA REACTION UNSPECIFIED      Medication List       Accurate as of 11/13/16  1:51 PM. Always use your most recent med list.          ACYCLOVIR PO Take 800 mg by mouth as needed (when pt bell palsy fares up). Reported on 12/02/2015   aspirin 325 MG tablet Take 325 mg by mouth daily.   BENADRYL 25 MG tablet Generic drug:  diphenhydrAMINE Take 25 mg by mouth every 4 (four) hours as needed for itching or allergies.   levocetirizine 5 MG tablet Commonly known as:  XYZAL Take 1 tablet (5 mg total) by mouth daily as needed for allergies.   predniSONE 10  MG tablet Commonly known as:  DELTASONE Take 40mg  x 3 days,20mg  x 3 days, 10mg  x 3 days   VITAMIN D PO Take 1 tablet by mouth daily.       Known medication allergies: Allergies  Allergen Reactions  . Plavix [Clopidogrel] Swelling and Anxiety    Nocturnal Restlessness, rash TONGUE SWELLING  . Lyrica [Pregabalin] Rash    Edema EDEMA REACTION UNSPECIFIED      I appreciate the opportunity to take part in [place] care. Please do not hesitate to contact me with questions.  Sincerely,   R. Edgar Frisk, MD

## 2016-11-13 NOTE — Patient Instructions (Addendum)
Recurrent urticaria Unclear etiology.  His history suggests the possibility of drug reaction to Plavix, however the duration of his symptoms after having discontinued this medication makes this less likely.  Certainly, the trauma emotional stress accompanying the trauma of his hospitalization in June 2016 may have caused mast cell destabilization.  Skin tests to select food allergens were negative today. NSAIDs and emotional stress commonly exacerbate urticaria but are not the underlying etiology in this case. Physical urticarias are negative by history (i.e. pressure-induced, temperature, vibration, solar, etc.). History and lesions are not consistent with urticaria pigmentosa so I am not suspicious for mastocytosis. There are no concomitant symptoms concerning for anaphylaxis or constitutional symptoms worrisome for an underlying malignancy. We will rule out other potential etiologies with labs. For symptom relief, patient is to take oral antihistamines as directed.  The following labs have been ordered: FCeRI antibody, TSH, anti-thyroglobulin antibody, thyroid peroxidase antibody, tryptase, C4, CBC, CMP, and galactose-alpha-1,3-galactose IgE level.  The patient will be called with further recommendations after lab results have returned.  Instructions have been discussed and provided for H1/H2 receptor blockade with titration to find lowest effective dose.  A prescription has been provided for levocetirizine, 5mg  daily as needed.  A journal is to be kept recording any foods eaten, beverages consumed, medications taken within a 6 hour period prior to the onset of symptoms, as well as record activities being performed, and environmental conditions. For any symptoms concerning for anaphylaxis, 911 is to be called immediately.  Angioedema Associated angioedema occurs in up to 50% of patients with chronic urticaria.  Treatment/diagnostic plan as outlined above.  Nonallergic rhinitis Perennial and  seasonal aeroallergens skin tests were negative today despite a positive histamine control.  Over-the-counter fluticasone nasal spray or triamcinolone nasal spray as needed.   When lab results have returned the patient will be called with further recommendations and follow up instructions.  Urticaria (Hives)  . Levocetirizine (Xyzal) 5 mg twice a day and ranitidine (Zantac) 150 mg twice a day. If no symptoms for 7-14 days then decrease to. . Levocetirizine (Xyzal) 5 mg twice a day and ranitidine (Zantac) 150 mg once a day.  If no symptoms for 7-14 days then decrease to. . Levocetirizine (Xyzal) 5 mg twice a day.  If no symptoms for 7-14 days then decrease to. . Levocetirizine (Xyzal) 5 mg once a day.  May use Benadryl (diphenhydramine) as needed for breakthrough symptoms       If symptoms return, then step up dosage

## 2016-11-13 NOTE — Assessment & Plan Note (Signed)
Unclear etiology.  His history suggests the possibility of drug reaction to Plavix, however the duration of his symptoms after having discontinued this medication makes this less likely.  Certainly, the trauma emotional stress accompanying the trauma of his hospitalization in June 2016 may have caused mast cell destabilization.  Skin tests to select food allergens were negative today. NSAIDs and emotional stress commonly exacerbate urticaria but are not the underlying etiology in this case. Physical urticarias are negative by history (i.e. pressure-induced, temperature, vibration, solar, etc.). History and lesions are not consistent with urticaria pigmentosa so I am not suspicious for mastocytosis. There are no concomitant symptoms concerning for anaphylaxis or constitutional symptoms worrisome for an underlying malignancy. We will rule out other potential etiologies with labs. For symptom relief, patient is to take oral antihistamines as directed.  The following labs have been ordered: FCeRI antibody, TSH, anti-thyroglobulin antibody, thyroid peroxidase antibody, tryptase, C4, CBC, CMP, and galactose-alpha-1,3-galactose IgE level.  The patient will be called with further recommendations after lab results have returned.  Instructions have been discussed and provided for H1/H2 receptor blockade with titration to find lowest effective dose.  A prescription has been provided for levocetirizine, 5mg  daily as needed.  A journal is to be kept recording any foods eaten, beverages consumed, medications taken within a 6 hour period prior to the onset of symptoms, as well as record activities being performed, and environmental conditions. For any symptoms concerning for anaphylaxis, 911 is to be called immediately.

## 2016-11-13 NOTE — Assessment & Plan Note (Signed)
Perennial and seasonal aeroallergens skin tests were negative today despite a positive histamine control.  Over-the-counter fluticasone nasal spray or triamcinolone nasal spray as needed.

## 2016-11-14 LAB — C4 COMPLEMENT: C4 Complement: 26 mg/dL (ref 16–47)

## 2016-11-16 LAB — ALPHA-GAL PANEL
Beef IgE: 1.5 kU/L — ABNORMAL HIGH (ref <0.35)
Class: 2
Class: 2
Galactose-alpha-1,3-galactose IgE*: 3.92 kU/L — ABNORMAL HIGH (ref <0.35)
Lamb/Mutton IgE: 0.3 kU/L (ref <0.35)
Pork IgE: 1.11 kU/L — ABNORMAL HIGH (ref <0.35)

## 2016-11-16 LAB — TRYPTASE: Tryptase: 4.4 ug/L (ref <11)

## 2016-11-21 LAB — CP CHRONIC URTICARIA INDEX PANEL
Histamine Release: <16 % (ref <16)
TSH: 3.21 mIU/L (ref 0.40–4.50)
Thyroglobulin Ab: 5 IU/mL — ABNORMAL HIGH (ref <2)
Thyroperoxidase Ab SerPl-aCnc: 731 IU/mL — ABNORMAL HIGH (ref <9)

## 2016-11-22 ENCOUNTER — Other Ambulatory Visit: Payer: Self-pay

## 2016-11-22 MED ORDER — EPINEPHRINE 0.3 MG/0.3ML IJ SOAJ: 0.3000 mg | Freq: Once | INTRAMUSCULAR | 0 refills | Status: AC

## 2017-01-23 ENCOUNTER — Encounter: Payer: Self-pay | Admitting: Neurology

## 2017-01-23 ENCOUNTER — Ambulatory Visit (INDEPENDENT_AMBULATORY_CARE_PROVIDER_SITE_OTHER): Payer: 59 | Admitting: Neurology

## 2017-01-23 VITALS — BP 137/85 | HR 85 | Wt 191.6 lb

## 2017-01-23 DIAGNOSIS — I639 Cerebral infarction, unspecified: Secondary | ICD-10-CM

## 2017-01-23 NOTE — Progress Notes (Signed)
[facility] [address] [place]. [place] [address] [phone]       OFFICE FOLLOW-UP NOTE  Mr. Justin [name] Date of Birth:  01/01/1951 Medical Record Number:  [id number]   HPI: 24 year Caucasian male seen for first office f/u visit after  [facility] admission for stroke on 04/21/13. He presented with inability to speak and right arm numbness for 20 minutes which resolved completely after reaching the emergency room.CT head was negative but MRI showed small scattered left hemispheric small infarcts while MRA showed no large vessel stenosis. 2DEcho showed normal ejection fraction and carotid dopplers showed no large vessel stenosis.LDL was minimally elevated at 103 and was started on zocor and aspirin. TEE showed no PFo or clot. He has had loop recorder inserted which has so far not recorded any atrial fibrillation.He states he has done well since discharge without any new neurovascular symptoms.he reports minor bruising on aspirin but has not had any bleeding.He is tolerating simvastatin without any myalgias.He has h/o Bell`s palsy in past. Update 07/25/15 : He returns for follow-up after last visit 2 years ago. He is here today for follow-up for another stroke in June 2016. On 03/22/15 he fell off a ladder with subsequent left sided grade 3A open left pilon fibula/ tibia fracture and left anterior shoulder dislocation. He required on 06/09 for I & D with ORIF left pilon, tibia and Fibula as well as CR of left shoulder redislocation. CT scan of the head showed acute left PCA infarct and MRI scan of the brain showed acute left medial occipital lobe infarct with additional small infarcts inbilateral occipital lobes and scattered cortical/white matter infarcts in watershed distribution bilaterally. No significant intracranial stenosis/ atherosclerosis noted on MRA. Carotid dopplers without significant ICA stenosis. BLE dopplers negative for DVT. Cardiac echo with EF 65% with no wall abnormality.  He was on aspirin which which was changed to Plavix for second stroke prevention. Patient had a previous stroke in July 2014 which was also embolic and had loop recorder placed at that time which was interrogated and atrial fibrillation was not found. Patient was transferred to inpatient rehabilitation and made gradual improvement. He states he has noted improvement in his right lower quadrant field cut but has in fact been able to drive without problems for the last few weeks now. His ankle seems to be healing quite well. Remains on Plavix which is tolerating well. He is also on Zocor and is tolerating it well without muscle aches or pains. He has no new complaints today. Update 01/24/2016 : He returns for follow-up after last visit 6 months ago. Is accompanied by his wife. He continues to do well without recurrent stroke or TIA symptoms. He continues to have right lower field vision difficulties but states he has been quite careful with his driving particularly while changing lanes and has not had any accidents. He developed hives as well as tongue swelling on Plavix and this was changed to aspirin which is tolerating better without bruising or bleeding. He states his blood pressure is quite good and usually though it is slightly elevated today at 145/93 in our office and he blames this on anxiety. He is tolerating Zocor well without any side effects. He plans to have follow-up lipid profile checked at next annual physical visit with his primary physician and summer. He continues to have some pain in his foot and has to walk slowly. He has no new complaints today. Update 01/23/2017 ; he returns for follow-up after  last visit 1 year ago. He is doing well without recurrent TIA or stroke symptoms. He continues to have right lower field quadrant vision difficulties but has been able to drive carefully using his minerals like changing lanes and has not had any near accidents. He states his blood pressure is usually  well controlled at home but today it is borderline at 137/85. His tolerate aspirin well without bruising or bleeding. He had lipid profile checked several months ago by his primary physician and it was satisfactory. He continues to have mild pain in his foot due to the metal pins but has no new complaints. He had his loop recorder removed in August 2017 by Dr. Caryl Comes as it ran out of battery after 3 years and paroxysmal atrial fibrillation was not yet found ROS:   14 system review of systems is positive for loss of peripheral vision, food allergies only and all other systems negative  PMH:  Past Medical History:  Diagnosis Date  . Angio-edema   . Bell's palsy 1980's; [id number]; 2000's; 12/2012; 03/2013   "multiple times" (04/21/2013)  . Chronic anticoagulation 05/30/2016  . Dislocation of left shoulder joint 03/31/2015  . Hypercholesteremia   . 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 PFO (patent foramen ovale)    PFO by 04/24/13 TEE  . Stroke ([place]) 2014   No residual from 2014.  Stroke 03/2015 - Right side effective- "mopst of strenghthas come back."  . Stroke ([place]) 2016  . TIA (transient ischemic attack) 04/21/2013  . Urticaria   . Vision abnormalities    right eye vision    Social History:  Social History   Social History  . Marital status: Married    Spouse name: N/A  . Number of children: 3  . Years of education: college   Occupational History  . [employer]   Social History Main Topics  . Smoking status: Never Smoker  . Smokeless tobacco: Never Used  . Alcohol use No     Comment: 03/22/2013 "glass of wine couple times/yr"  . Drug use: No  . Sexual activity: Yes   Other Topics Concern  . Not on file   Social History Narrative  . No narrative on file    Medications:   Current Outpatient Prescriptions on File Prior to Visit  Medication Sig Dispense Refill  . ACYCLOVIR PO Take 800 mg by mouth as needed (when pt bell palsy fares up).  Reported on 12/02/2015    . aspirin 325 MG tablet Take 325 mg by mouth daily.    . Cholecalciferol (VITAMIN D PO) Take 1 tablet by mouth daily.    . diphenhydrAMINE (BENADRYL) 25 MG tablet Take 25 mg by mouth every 4 (four) hours as needed for itching or allergies.     Marland Kitchen levocetirizine (XYZAL) 5 MG tablet Take 1 tablet (5 mg total) by mouth daily as needed for allergies. 30 tablet 5  . predniSONE (DELTASONE) 10 MG tablet Take 40mg  x 3 days,20mg  x 3 days, 10mg  x 3 days 21 tablet 0   No current facility-administered medications on file prior to visit.     Allergies:   Allergies  Allergen Reactions  . Plavix [Clopidogrel] Swelling and Anxiety    Nocturnal Restlessness, rash TONGUE SWELLING  . Lyrica [Pregabalin] Rash    Edema EDEMA REACTION UNSPECIFIED      Physical Exam General: well developed, well nourished, seated, in no evident distress Head: head normocephalic and atraumatic.  Neck: supple with no carotid or supraclavicular bruits Cardiovascular: regular rate and rhythm, no murmurs Musculoskeletal: no deformity Skin:  no rash/petichiae Vascular:  Normal pulses all extremities Vitals:   01/23/17 0826  BP: 137/85  Pulse: 85    Neurologic Exam Mental Status: Awake and fully alert. Oriented to place and time. Recent and remote memory intact. Attention span, concentration and fund of knowledge appropriate. Mood and affect appropriate.  Cranial Nerves: Fundoscopic exam not done. Pupils equal, briskly reactive to light. Extraocular movements full without nystagmus. Visual fields show partial right inferior quadrantanopsia to confrontation. Hearing intact. Facial sensation intact. Face, tongue, palate moves normally and symmetrically.  Motor: Normal bulk and tone. Normal strength in all tested extremity muscles. Favors left foot secondary to ankle pain Sensory.: intact to touch and pinprick and vibratory sensation.  Coordination: Rapid alternating movements normal in all  extremities. Finger-to-nose and heel-to-shin performed accurately bilaterally. Gait and Station: Arises from chair without difficulty. Stance is normal. Gait is antalgic with favoring left ankle Unable to heel, toe and tandem walk without difficulty.  Reflexes: 1+ and symmetric. Toes downgoing.   NIHSS  1 Modified Rankin  1   ASSESSMENT:  66 year male with embolic left MCA branch infarcts in July 2014 off cryptogenic etiology. Vascular risk factor of hyperlipidimia only. Recent bilateral occipital embolic infarcts in June 2016 with residual mild right lower quadrantanopsia.   PLAN: I had a long d/w patient about his remote cryptogenic strokes, risk for recurrent stroke/TIAs, personally independently reviewed imaging studies and stroke evaluation results and answered questions.Continue aspirin 325 mg daily  for secondary stroke prevention and maintain strict control of hypertension with blood pressure goal below 130/90, diabetes with hemoglobin A1c goal below 6.5% and lipids with LDL cholesterol goal below 70 mg/dL. I also advised the patient to eat a healthy diet with plenty of whole grains, cereals, fruits and vegetables, exercise regularly and maintain ideal body weight . He was advised to be careful with his driving due to his mild peripheral vision loss on the right It has been several years from his strokes I do not believe routine scheduled follow-up appointment with me is necessary. He was  advised to follow-up with his primary care physician and he may be referred back in the future as necessary. Greater than 50% time during this 25 minute visit was spent on counseling and coordination of care about his remote stroke, peripheral vision loss and discussion about stroke prevention and answering questions   Antony Contras, MD

## 2017-01-23 NOTE — Patient Instructions (Signed)
I had a long d/w patient about his remote cryptogenic strokes, risk for recurrent stroke/TIAs, personally independently reviewed imaging studies and stroke evaluation results and answered questions.Continue aspirin 325 mg daily  for secondary stroke prevention and maintain strict control of hypertension with blood pressure goal below 130/90, diabetes with hemoglobin A1c goal below 6.5% and lipids with LDL cholesterol goal below 70 mg/dL. I also advised the patient to eat a healthy diet with plenty of whole grains, cereals, fruits and vegetables, exercise regularly and maintain ideal body weight . It has been several years from his strokes I do not believe routine scheduled follow-up appointment with me is necessary. He was  advised to follow-up with his primary care physician and he may be referred back in the future as necessary.

## 2017-02-09 ENCOUNTER — Encounter: Payer: Self-pay | Admitting: Allergy and Immunology

## 2017-03-01 IMAGING — MR MR SHOULDER*L* W/O CM
4 of 6 series · 19 of 40 positions shown · non-contrast
Comparison: Radiographs dated 03/24/2015 and 03/22/2015

CLINICAL DATA: Left shoulder pain due to recent anterior
dislocation.

EXAM:
MRI OF THE LEFT SHOULDER WITHOUT CONTRAST
TECHNIQUE: Multiplanar, multisequence MR imaging of the shoulder was performed.
No intravenous contrast was administered.

[Series 4: T2 fat-sat · axial · 3.0mm · 0.27mm/px · z∈[+5,+75]mm · 6 of 27 slices shown (1 of 3)]
[im 1/27]
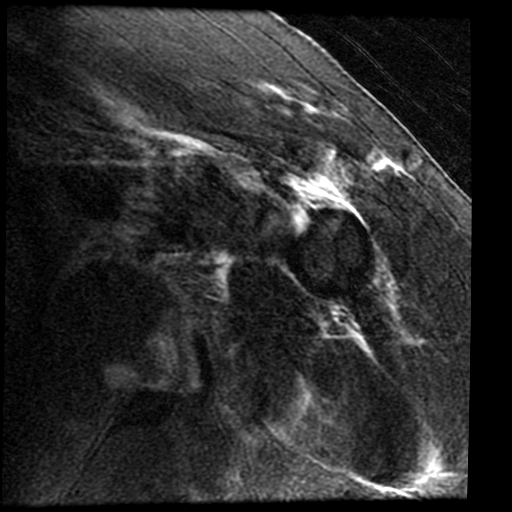
[im 5/27]
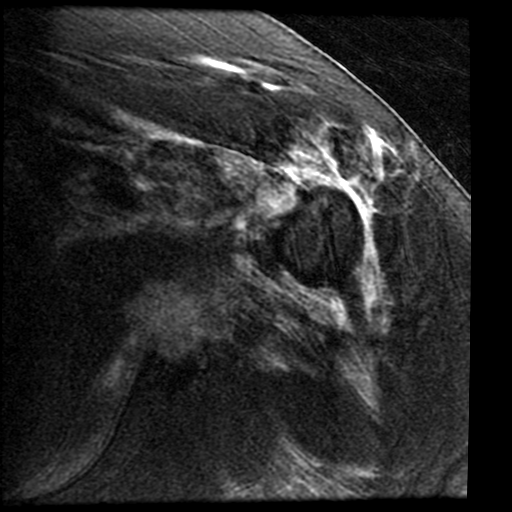
[im 9/27]
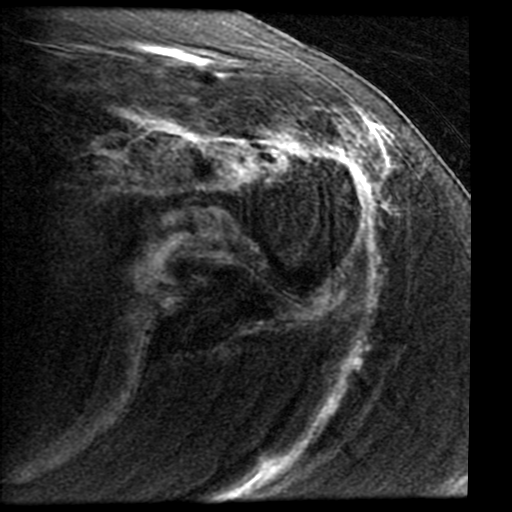
[im 14/27]
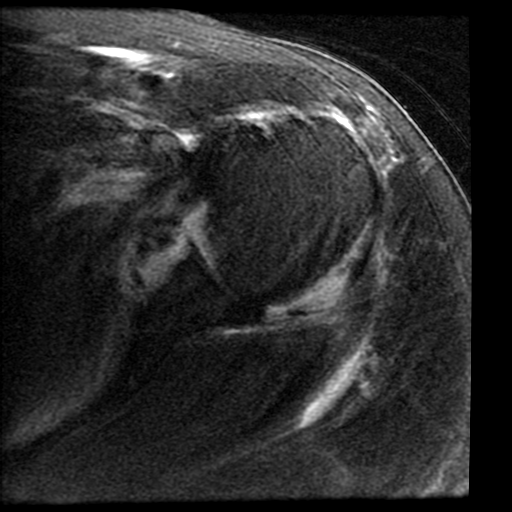
[im 18/27]
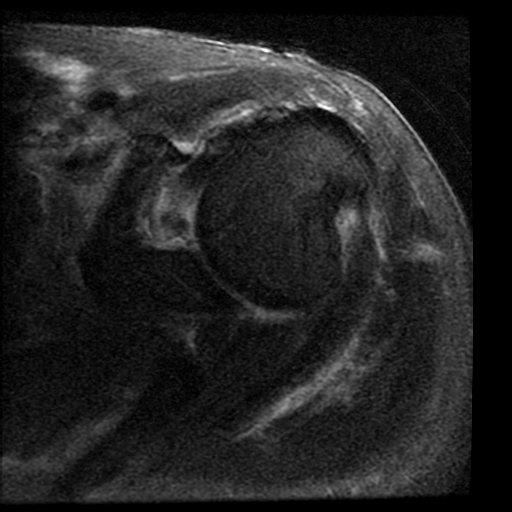
[im 22/27]
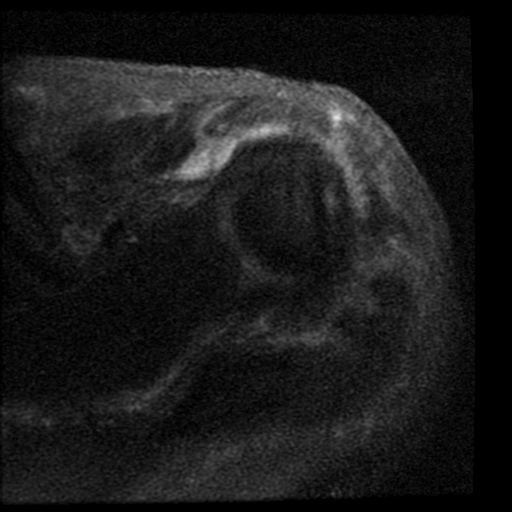

[Series 5: T2 fat-sat · oblique · 3.0mm · 0.27mm/px · 3 of 24 slices shown (2 of 3)]
[im 5/24]
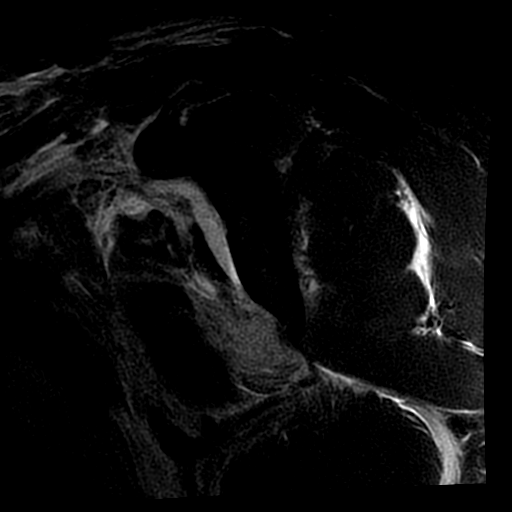
[im 14/24]
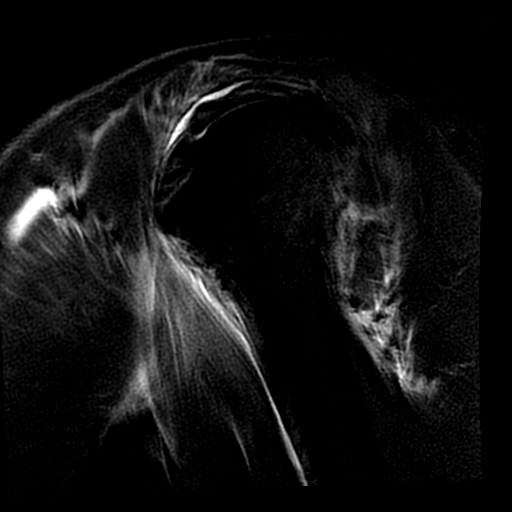
[im 24/24]
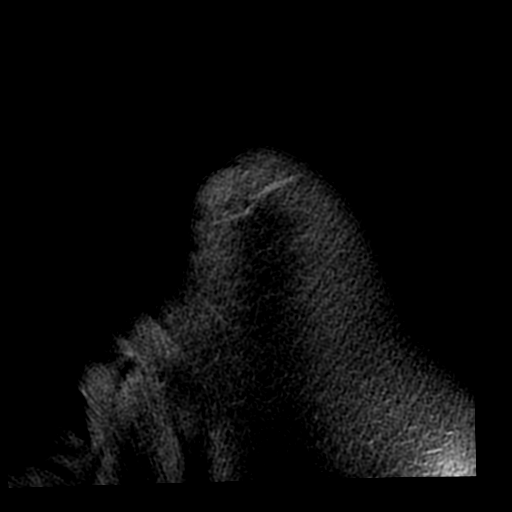

[Series 7: T2 fat-sat · oblique · 3.0mm · 0.27mm/px · 3 of 27 slices shown (3 of 3)]
[im 5/27]
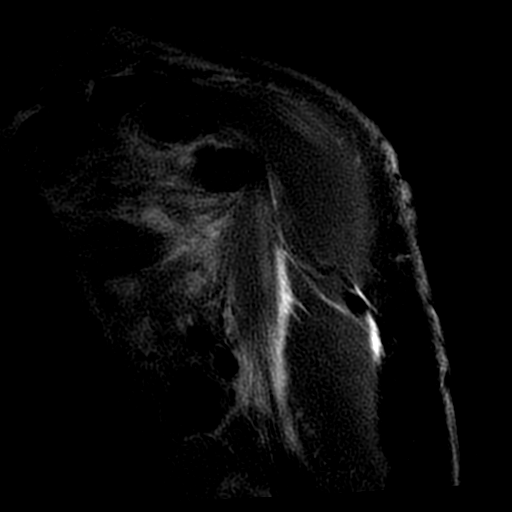
[im 14/27]
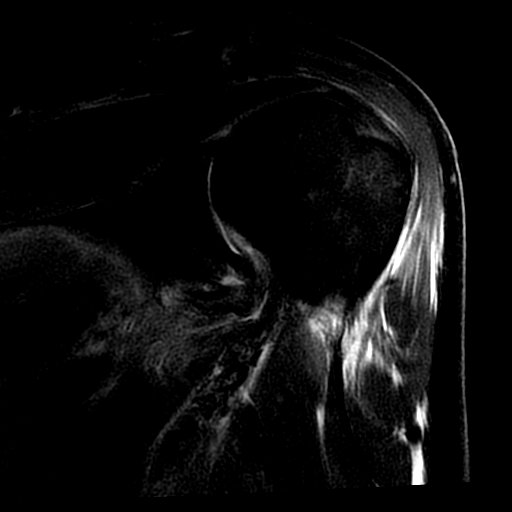
[im 22/27]
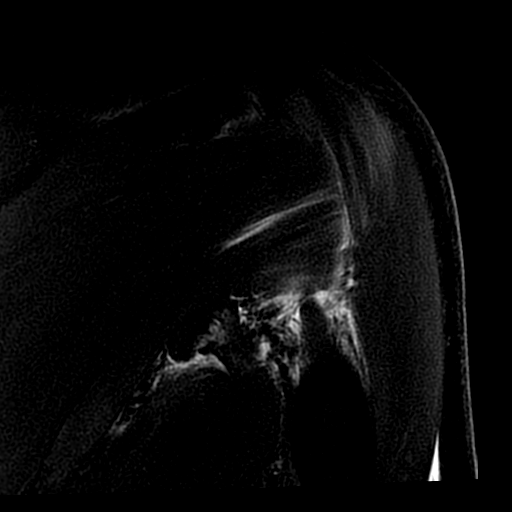

[Series 8: PD · oblique · 3.0mm · 0.27mm/px · 7 of 27 slices shown]
[im 1/27]
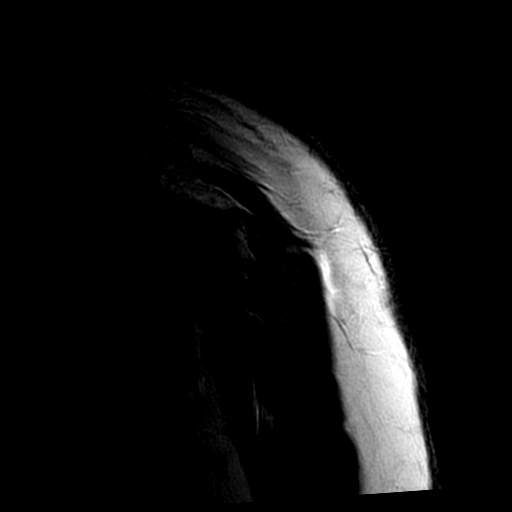
[im 5/27]
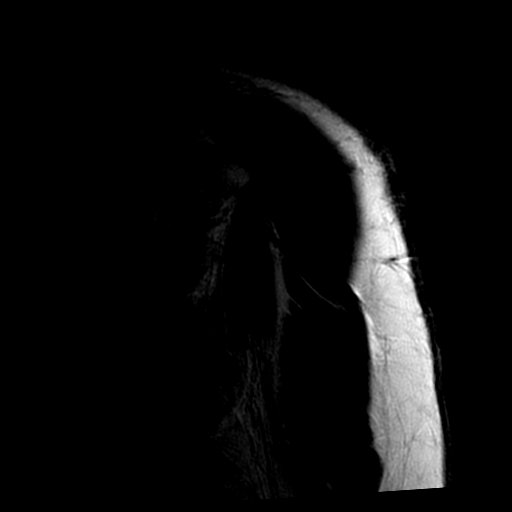
[im 9/27]
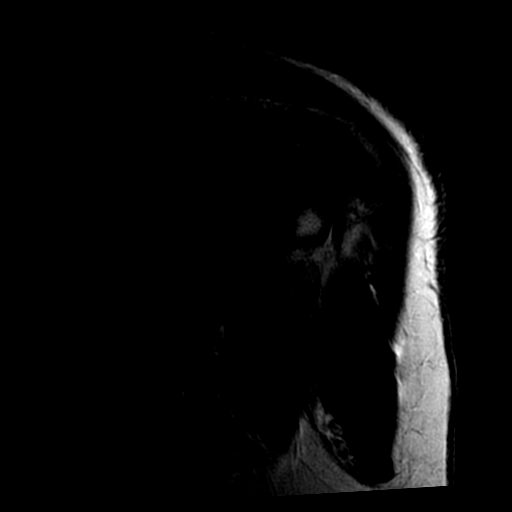
[im 14/27]
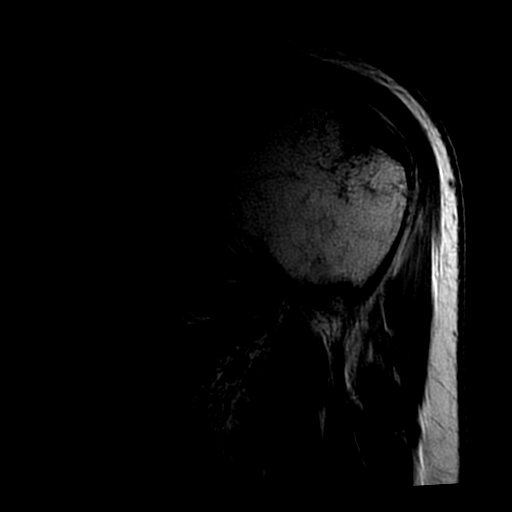
[im 18/27]
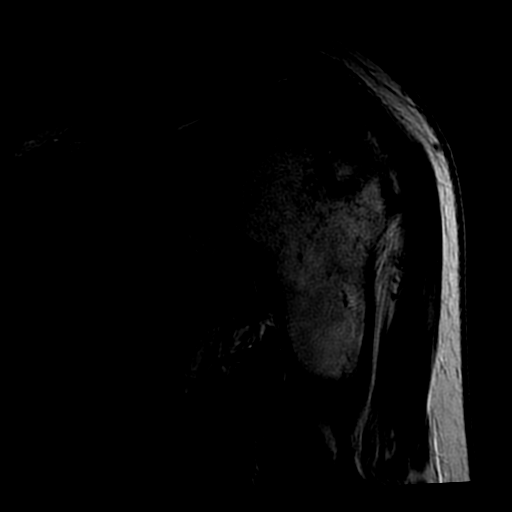
[im 22/27]
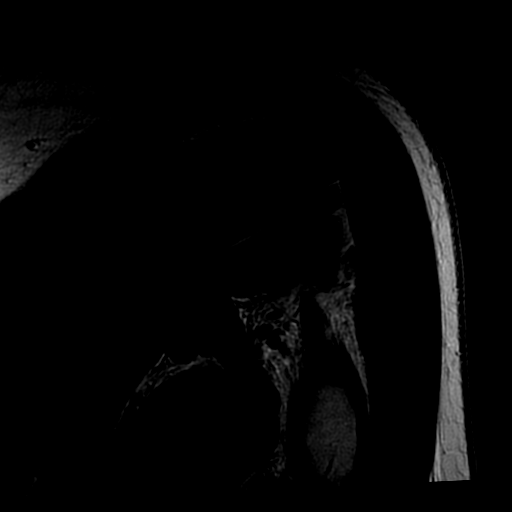
[im 27/27]
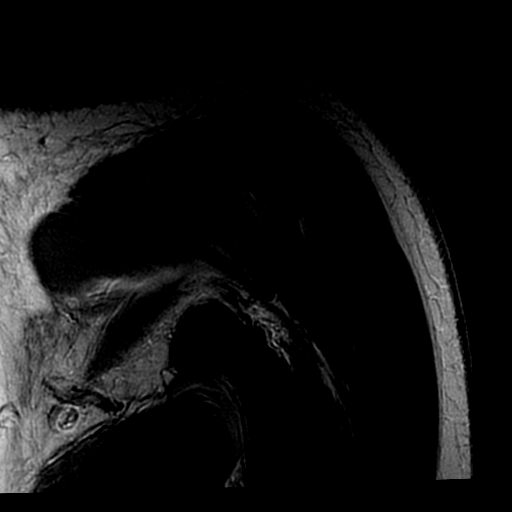

[19 of 40 positions shown; findings below may reference images not displayed]

FINDINGS: Rotator cuff: There are no rotator cuff tears. However, there is a
prominent strain/ contusion of the distal infraspinatus at the site
of the prominent Hill-Sachs lesion on the posterior lateral aspect
of the humeral head.

Muscles: There is extensive edema in the subscapularis muscle
consistent with a strain. There is also an there strains of the
deltoid and teres major muscles. Extensive strains of the
coracobrachialis and short head of the biceps brachii muscle

Biceps long head:  Properly located and intact.

Acromioclavicular Joint: Minimal degenerative changes. Type 1
acromion.

Glenohumeral Joint: Glenohumeral joint effusion with
hemorrhage/extravasated joint fluid in the soft tissues. Avulsion of
the glenoid attachment of the inferior glenohumeral ligament.
Disruption of the posterior joint capsule at the 3 o'clock position,
best seen on images 13 and 14 of series 9.

Labrum: Avulsion of the anterior and inferior aspects of the labrum
with small osseous Bankart lesion. Anterior scapular periosteum is
stripped adjacent to the avulsed labrum.

Bones: Small osseous Bankart lesion. Prominent Hill-Sachs impaction
fracture of the posterior lateral aspect of the humeral head.
IMPRESSION: 1. Small osseous Bankart lesion with avulsion of the anterior and
inferior aspects of the labrum.
2. Avulsion of the glenoid attachment of the inferior glenohumeral
ligament.
3. Multiple muscle strains around the shoulder.
4. Tear of the posterior capsule
5. Prominent Hill-Sachs lesion.

## 2017-11-16 ENCOUNTER — Encounter: Payer: Self-pay | Admitting: Family Medicine

## 2017-11-18 MED ORDER — EPINEPHRINE 0.3 MG/0.3ML IJ SOAJ: 0.3000 mg | Freq: Once | INTRAMUSCULAR | 1 refills | Status: AC

## 2018-09-02 ENCOUNTER — Encounter: Payer: Medicare Other | Admitting: Family Medicine

## 2018-11-20 ENCOUNTER — Encounter: Payer: Self-pay | Admitting: Family Medicine

## 2019-01-21 ENCOUNTER — Encounter: Payer: Medicare Other | Admitting: Family Medicine

## 2019-02-02 ENCOUNTER — Other Ambulatory Visit: Payer: Self-pay | Admitting: Family Medicine

## 2019-02-02 ENCOUNTER — Encounter: Payer: Self-pay | Admitting: Family Medicine

## 2019-02-02 DIAGNOSIS — E785 Hyperlipidemia, unspecified: Secondary | ICD-10-CM

## 2019-02-02 DIAGNOSIS — Z0000 Encounter for general adult medical examination without abnormal findings: Secondary | ICD-10-CM

## 2019-02-02 DIAGNOSIS — Z91018 Allergy to other foods: Secondary | ICD-10-CM

## 2019-02-02 NOTE — Addendum Note (Signed)
Addended by: Shary Decamp B on: 02/02/2019 09:41 AM   Modules accepted: Orders

## 2019-02-04 ENCOUNTER — Other Ambulatory Visit: Payer: Medicare Other

## 2019-02-04 ENCOUNTER — Other Ambulatory Visit: Payer: Self-pay

## 2019-02-04 DIAGNOSIS — E785 Hyperlipidemia, unspecified: Secondary | ICD-10-CM

## 2019-02-04 DIAGNOSIS — Z0000 Encounter for general adult medical examination without abnormal findings: Secondary | ICD-10-CM

## 2019-02-04 DIAGNOSIS — E782 Mixed hyperlipidemia: Secondary | ICD-10-CM | POA: Diagnosis not present

## 2019-02-04 DIAGNOSIS — Z91018 Allergy to other foods: Secondary | ICD-10-CM

## 2019-02-04 DIAGNOSIS — Z125 Encounter for screening for malignant neoplasm of prostate: Secondary | ICD-10-CM | POA: Diagnosis not present

## 2019-02-06 ENCOUNTER — Encounter: Payer: Medicare Other | Admitting: Family Medicine

## 2019-02-09 LAB — LIPID PANEL
Cholesterol: 152 mg/dL (ref <200)
HDL: 47 mg/dL (ref >=40)
LDL Cholesterol (Calc): 88 mg/dL (calc)
Non-HDL Cholesterol (Calc): 105 mg/dL (calc) (ref <130)
Total CHOL/HDL Ratio: 3.2 (calc) (ref <5.0)
Triglycerides: 76 mg/dL (ref <150)

## 2019-02-09 LAB — CBC WITH DIFFERENTIAL/PLATELET
Absolute Monocytes: 480 cells/uL (ref 200–950)
Basophils Absolute: 58 cells/uL (ref 0–200)
Basophils Relative: 0.9 %
Eosinophils Absolute: 58 cells/uL (ref 15–500)
Eosinophils Relative: 0.9 %
HCT: 47.5 % (ref 38.5–50.0)
Hemoglobin: 15.8 g/dL (ref 13.2–17.1)
Lymphs Abs: 2138 cells/uL (ref 850–3900)
MCH: 28.9 pg (ref 27.0–33.0)
MCHC: 33.3 g/dL (ref 32.0–36.0)
MCV: 87 fL (ref 80.0–100.0)
MPV: 9.5 fL (ref 7.5–12.5)
Monocytes Relative: 7.5 %
Neutro Abs: 3667 cells/uL (ref 1500–7800)
Neutrophils Relative %: 57.3 %
Platelets: 238 10*3/uL (ref 140–400)
RBC: 5.46 10*6/uL (ref 4.20–5.80)
RDW: 12.8 % (ref 11.0–15.0)
Total Lymphocyte: 33.4 %
WBC: 6.4 10*3/uL (ref 3.8–10.8)

## 2019-02-09 LAB — ALPHA-GAL PANEL
Beef IgE: 0.61 kU/L — ABNORMAL HIGH (ref <0.35)
Class: 1
Class: 1
Galactose-alpha-1,3-galactose IgE: 0.92 kU/L — ABNORMAL HIGH (ref <0.10)
LAMB/MUTTON IGE: 0.11 kU/L (ref <0.35)
Pork IgE: 0.36 kU/L — ABNORMAL HIGH (ref <0.35)

## 2019-02-09 LAB — COMPREHENSIVE METABOLIC PANEL
AG Ratio: 1.8 (calc) (ref 1.0–2.5)
ALT: 18 U/L (ref 9–46)
AST: 20 U/L (ref 10–35)
Albumin: 4.5 g/dL (ref 3.6–5.1)
Alkaline phosphatase (APISO): 77 U/L (ref 35–144)
BUN: 17 mg/dL (ref 7–25)
CO2: 24 mmol/L (ref 20–32)
Calcium: 10 mg/dL (ref 8.6–10.3)
Chloride: 107 mmol/L (ref 98–110)
Creat: 1.07 mg/dL (ref 0.70–1.25)
Globulin: 2.5 g/dL (calc) (ref 1.9–3.7)
Glucose, Bld: 92 mg/dL (ref 65–99)
Potassium: 4.9 mmol/L (ref 3.5–5.3)
Sodium: 141 mmol/L (ref 135–146)
Total Bilirubin: 0.5 mg/dL (ref 0.2–1.2)
Total Protein: 7 g/dL (ref 6.1–8.1)

## 2019-02-09 LAB — PSA: PSA: 0.4 ng/mL (ref <=4.0)

## 2019-02-10 ENCOUNTER — Other Ambulatory Visit: Payer: Self-pay

## 2019-02-10 ENCOUNTER — Encounter: Payer: Self-pay | Admitting: Family Medicine

## 2019-02-10 ENCOUNTER — Ambulatory Visit (INDEPENDENT_AMBULATORY_CARE_PROVIDER_SITE_OTHER): Payer: Medicare Other | Admitting: Family Medicine

## 2019-02-10 VITALS — BP 128/72 | HR 70 | Temp 98.4°F | Resp 14 | Ht 68.0 in | Wt 193.0 lb

## 2019-02-10 DIAGNOSIS — Z91018 Allergy to other foods: Secondary | ICD-10-CM

## 2019-02-10 DIAGNOSIS — Z8673 Personal history of transient ischemic attack (TIA), and cerebral infarction without residual deficits: Secondary | ICD-10-CM

## 2019-02-10 DIAGNOSIS — R2232 Localized swelling, mass and lump, left upper limb: Secondary | ICD-10-CM

## 2019-02-10 DIAGNOSIS — Z0000 Encounter for general adult medical examination without abnormal findings: Secondary | ICD-10-CM | POA: Diagnosis not present

## 2019-02-10 DIAGNOSIS — Z23 Encounter for immunization: Secondary | ICD-10-CM

## 2019-02-10 MED ORDER — ZOSTER VAC RECOMB ADJUVANTED 50 MCG/0.5ML IM SUSR: 0.5000 mL | Freq: Once | INTRAMUSCULAR | 1 refills | Status: AC

## 2019-02-10 NOTE — Patient Instructions (Signed)
F/U 1 year for  Physical Shingrix sent to pharmacy Prevnar 13 given today

## 2019-02-10 NOTE — Progress Notes (Signed)
Subjective:   Patient presents for Medicare Annual/Subsequent preventive examination.  Here for annual wellness exam.  His last visit was in November 2017.  He is no longer following with neurology he has history of cryptogenic stroke.  He is now maintained on aspirin 325 mg once a day.  He does not need cardiac follow-up either.  He does not have any known hypertension.  He had mild hyperlipidemia in the past but recent levels have been good.  He is not on any statin drug.  He did notice a nodule in the palm of his left hand about a month ago it is not causing any pain or or affecting his activities.  Just wanted me to take a look at it.  Does have history of alpha gal he had high titers a few years ago when he had a significant rash and reaction while hospitalized.  Repeat titers were done with his recent labs which were reviewed at bedside his titers have actually significantly improved his port titers essentially normal his beef titer has decreased by 50% he would like to try eating these foods to see if he has a reaction. Review Past Medical/Family/Social: Per EMR   Risk Factors  Current exercise habits: No exercise but states he stays active on his job Dietary issues discussed: No concerns  Cardiac risk factors:  Cyrptogenic stroke  Depression Screen  (Note: if answer to either of the following is "Yes", a more complete depression screening is indicated)  Over the past two weeks, have you felt down, depressed or hopeless? No Over the past two weeks, have you felt little interest or pleasure in doing things? No Have you lost interest or pleasure in daily life? No Do you often feel hopeless? No Do you cry easily over simple problems? No   Activities of Daily Living  In your present state of health, do you have any difficulty performing the following activities?:  Driving? No  Managing money? No  Feeding yourself? No  Getting from bed to chair? No  Climbing a flight of stairs? No   Preparing food and eating?: No  Bathing or showering? No  Getting dressed: No  Getting to the toilet? No  Using the toilet:No  Moving around from place to place: No  In the past year have you fallen or had a near fall?:No  Are you sexually active? yes  Do you have more than one partner? No   Hearing Difficulties: No  Do you often ask people to speak up or repeat themselves? No  Do you experience ringing or noises in your ears? No Do you have difficulty understanding soft or whispered voices? No  Do you feel that you have a problem with memory? No Do you often misplace items? No  Do you feel safe at home? Yes  Cognitive Testing  Alert? Yes Normal Appearance?Yes  Oriented to person? Yes Place? Yes  Time? Yes  Recall of three objects? Yes  Can perform simple calculations? Yes  Displays appropriate judgment?Yes  Can read the correct time from a watch face?Yes   List the Names of Other Physician/Practitioners you currently use:  None currently  Screening Tests / Date Colonoscopy      up-to-date               Zostavax today Hepatitis C screening -2017 Influenza Vaccine declines Tetanus/tdap up-to-date  pneumonia vaccine due  ROS: GEN- denies fatigue, fever, weight loss,weakness, recent illness HEENT- denies eye drainage, change in vision, nasal discharge,  CVS- denies chest pain, palpitations RESP- denies SOB, cough, wheeze ABD- denies N/V, change in stools, abd pain GU- denies dysuria, hematuria, dribbling, incontinence MSK- denies joint pain, muscle aches, injury Neuro- denies headache, dizziness, syncope, seizure activity  Physical: vitals reviewed  GEN- NAD, alert and oriented x3 HEENT- PERRL, EOMI, non injected sclera, pink conjunctiva, MMM, oropharynx clear,wears glass, TM in tact  Neck- Supple, no thryomegaly, no bruit  CVS- RRR, no murmur RESP-CTAB ABD-NABS,soft,, NT,ND Skin- Left hand beneath 4th digit small nodule palpated, NT,  MSK FROM Wrist, Hand,  normal grasp EXT- trace chronic left edema Pulses- Radial, DP- 2+    Assessment:    Annual wellness medicare exam   Plan:    During the course of the visit the patient was educated and counseled about appropriate screening and preventive services including:   Prevnar 13 given in office Shingles vaccine. Prescription given to that he can get the Shingrix vaccine at the pharmacy or Medicare part D.   No urinary symptoms  Screen Neg  for depression/Fall/ Audit C  Fasting labs are normal.  With regard to the alpha gal we discussed that he can try the port first as this level is pretty much normal.  He has EpiPen and will have Benadryl on hand.  He can then moved to smaller amounts of beef to see if he has a reaction.  He will also check dates on his Epi Pen  Prostate cancer screening antigen was normal.  Benign nodules not causing any pain at this time we will just monitor.  If he does have increased pain or symptoms we will get him to a hand surgeon  Diet review for nutrition referral? Yes ____ Not Indicated __x__  Patient Instructions (the written plan) was given to the patient.  Medicare Attestation  I have personally reviewed:  The patient's medical and social history  Their use of alcohol, tobacco or illicit drugs  Their current medications and supplements  The patient's functional ability including ADLs,fall risks, home safety risks, cognitive, and hearing and visual impairment  Diet and physical activities  Evidence for depression or mood disorders  The patient's weight, height, BMI, and visual acuity have been recorded in the chart. I have made referrals, counseling, and provided education to the patient based on review of the above and I have provided the patient with a written personalized care plan for preventive services.

## 2020-02-12 ENCOUNTER — Telehealth: Payer: Self-pay | Admitting: Family Medicine

## 2020-02-12 DIAGNOSIS — E860 Dehydration: Secondary | ICD-10-CM | POA: Diagnosis not present

## 2020-02-12 DIAGNOSIS — R52 Pain, unspecified: Secondary | ICD-10-CM | POA: Diagnosis not present

## 2020-02-12 DIAGNOSIS — Z6827 Body mass index (BMI) 27.0-27.9, adult: Secondary | ICD-10-CM | POA: Diagnosis not present

## 2020-02-12 DIAGNOSIS — R509 Fever, unspecified: Secondary | ICD-10-CM | POA: Diagnosis not present

## 2020-02-12 NOTE — Telephone Encounter (Signed)
Received call from patient.   Reports that all tests returned negative for patient.   Inquired as to if patient can take another full dose ASA for fever.   Advised to alternate between IBU/APAP.

## 2020-02-12 NOTE — Telephone Encounter (Signed)
Call placed to patient wife [name].   Reports that patient is running fever and is fatigued and malaise.   Reports that patient did agree to go to [facility] and is currently being seen. States that swabs have been obtained for COVID/Strep.

## 2020-02-12 NOTE — Telephone Encounter (Signed)
Patient is out of town and is not feeling well, wife Rosann Auerbach is calling to see if he can take Asprin, please call her back at [phone]

## 2020-02-15 ENCOUNTER — Other Ambulatory Visit: Payer: Self-pay

## 2020-02-15 ENCOUNTER — Telehealth (INDEPENDENT_AMBULATORY_CARE_PROVIDER_SITE_OTHER): Payer: Medicare Other | Admitting: Family Medicine

## 2020-02-15 ENCOUNTER — Encounter: Payer: Self-pay | Admitting: Family Medicine

## 2020-02-15 ENCOUNTER — Other Ambulatory Visit: Payer: Self-pay | Admitting: *Deleted

## 2020-02-15 VITALS — BP 132/82 | HR 83 | Temp 97.3°F | Resp 16

## 2020-02-15 DIAGNOSIS — R509 Fever, unspecified: Secondary | ICD-10-CM

## 2020-02-15 DIAGNOSIS — R3129 Other microscopic hematuria: Secondary | ICD-10-CM

## 2020-02-15 DIAGNOSIS — R112 Nausea with vomiting, unspecified: Secondary | ICD-10-CM | POA: Diagnosis not present

## 2020-02-15 DIAGNOSIS — E860 Dehydration: Secondary | ICD-10-CM | POA: Diagnosis not present

## 2020-02-15 LAB — URINALYSIS, ROUTINE W REFLEX MICROSCOPIC
Bacteria, UA: NONE SEEN /HPF
Bilirubin Urine: NEGATIVE
Glucose, UA: NEGATIVE
Hyaline Cast: NONE SEEN /LPF
Leukocytes,Ua: NEGATIVE
Nitrite: NEGATIVE
Specific Gravity, Urine: 1.02 (ref 1.001–1.03)
Squamous Epithelial / HPF: NONE SEEN /HPF (ref <=5)
WBC, UA: NONE SEEN /HPF (ref 0–5)
pH: 5.5 (ref 5.0–8.0)

## 2020-02-15 LAB — COMPREHENSIVE METABOLIC PANEL
AG Ratio: 1.2 (calc) (ref 1.0–2.5)
ALT: 24 U/L (ref 9–46)
AST: 21 U/L (ref 10–35)
Albumin: 3.2 g/dL — ABNORMAL LOW (ref 3.6–5.1)
Alkaline phosphatase (APISO): 141 U/L (ref 35–144)
BUN: 14 mg/dL (ref 7–25)
CO2: 25 mmol/L (ref 20–32)
Calcium: 9.1 mg/dL (ref 8.6–10.3)
Chloride: 102 mmol/L (ref 98–110)
Creat: 1.05 mg/dL (ref 0.70–1.25)
Globulin: 2.7 g/dL (calc) (ref 1.9–3.7)
Glucose, Bld: 111 mg/dL — ABNORMAL HIGH (ref 65–99)
Potassium: 3.9 mmol/L (ref 3.5–5.3)
Sodium: 137 mmol/L (ref 135–146)
Total Bilirubin: 0.6 mg/dL (ref 0.2–1.2)
Total Protein: 5.9 g/dL — ABNORMAL LOW (ref 6.1–8.1)

## 2020-02-15 LAB — CBC WITH DIFFERENTIAL/PLATELET
Absolute Monocytes: 1648 cells/uL — ABNORMAL HIGH (ref 200–950)
Basophils Absolute: 46 cells/uL (ref 0–200)
Basophils Relative: 0.3 %
Eosinophils Absolute: 92 cells/uL (ref 15–500)
Eosinophils Relative: 0.6 %
HCT: 44 % (ref 38.5–50.0)
Hemoglobin: 14.7 g/dL (ref 13.2–17.1)
Lymphs Abs: 1602 cells/uL (ref 850–3900)
MCH: 28.6 pg (ref 27.0–33.0)
MCHC: 33.4 g/dL (ref 32.0–36.0)
MCV: 85.6 fL (ref 80.0–100.0)
MPV: 10.7 fL (ref 7.5–12.5)
Monocytes Relative: 10.7 %
Neutro Abs: 12012 cells/uL — ABNORMAL HIGH (ref 1500–7800)
Neutrophils Relative %: 78 %
Platelets: 163 10*3/uL (ref 140–400)
RBC: 5.14 10*6/uL (ref 4.20–5.80)
RDW: 12.8 % (ref 11.0–15.0)
Total Lymphocyte: 10.4 %
WBC: 15.4 10*3/uL — ABNORMAL HIGH (ref 3.8–10.8)

## 2020-02-15 LAB — MICROSCOPIC MESSAGE

## 2020-02-15 MED ORDER — CIPROFLOXACIN HCL 500 MG PO TABS
500.0000 mg | ORAL_TABLET | Freq: Two times a day (BID) | ORAL | 0 refills | Status: AC
Start: 2020-02-15 — End: 2020-02-22

## 2020-02-15 NOTE — Progress Notes (Signed)
**Note Justin-Identified via Obfuscation** Subjective:    Patient ID: Justin [name], male    DOB: 1951-06-02, 69 y.o.   MRN: [id number]  Patient presents for Fever (abd pain )  Patient initially scheduled telehealth visit in setting of fever GI symptoms.  I converted this to an office visit as he needs to be evaluated in person.  He was seen at the urgent care after he had about 12 hours of nausea and vomiting on Tuesday night to Wednesday morning.  He took Pepto-Bismol and that solved the vomiting but then he spiked a fever to 102 F.  He had fatigue along with abdominal discomfort and cramps.  He has not had any diarrhea and no cough or congestion symptoms at that time.  He was seen at the [facility] urgent care and speaks very [place] they did strep/flu and Covid rapid testing which came back negative.  He had urinalysis done which shows some blood he was told that he was dehydrated.  He was given nausea medication and told to drink Pedialyte and Gatorade to help with the dehydration.  He continues to have fever temperature this morning was 101 F no known sick contacts- took fever reducer and broke out in sweats .  He has not had his COVID-19 vaccine.  His wife has not been sick.  On the way home yesterday he began with cough with production.  He still feels quite fatigued and still has some abdominal discomfort mostly in epigastric region     Review Of Systems:  GEN- denies fatigue, +fever, weight loss,weakness, recent illness HEENT- denies eye drainage, change in vision, nasal discharge, CVS- denies chest pain, palpitations RESP- denies SOB, cough, wheeze ABD- +N/V, change in stools,+ abd pain GU- denies dysuria, hematuria, dribbling, incontinence MSK- denies joint pain, muscle aches, injury Neuro- denies headache, dizziness, syncope, seizure activity       Objective:    BP 132/82   Pulse 83   Temp (!) 97.3 F (36.3 C) (Temporal)   Resp 16   SpO2 100%  GEN- NAD, alert and oriented x3 HEENT- PERRL, EOMI, non  injected sclera, pink conjunctiva, MMM, oropharynx clear, no sinus tenderness,TM clear no effusion  Neck- Supple, no LAD  CVS- RRR, no murmur RESP-CTAB ABD-NABS,soft,mild ttp epigastric region, no rebound, no guarding, no CVA tenderness  EXT- No edema Pulses- Radial,  2+        Assessment & Plan:      Problem List Items Addressed This Visit    None    Visit Diagnoses    Fever, unspecified fever cause    -  Primary   DD include viral illness ( COVID-19 ) possible as he has now developed some URI symptoms.  Since he is now 6 days from his symptoms starting I recommend repeating PCR COVID-19 testing.  He also states that he had an episode of swelling on the side of his tongue and his heel on this past Saturday but it quickly resolved with Benadryl.  Other possibility is urinary tract infection he does have a significant amount of microscopic hematuria without any signs of kidney stone or even prostatitis.  Will send culture off.  In the setting of his dehydration he was given 1 L of fluid in the office.  He is no longer requiring any antinausea medicine was able to eat eggs this morning without any difficulty.  His abdominal exam and respiratory exam are fairly benign he has some mild tenderness in the epigastric region where he had been  vomiting so much before.  We will hold on imaging unless his labs are work 1 way or the other.  He is to quarantine until his symptoms improved and Covid testing is negative   Relevant Orders   CBC with Differential/Platelet   Comprehensive metabolic panel   SARS-COV-2 RNA,(COVID-19) QUAL NAAT   Urine Culture   Hematuria, microscopic       Relevant Orders   Urinalysis, Routine w reflex microscopic (Completed)   Urine Culture   Nausea and vomiting, intractability of vomiting not specified, unspecified vomiting type       Dehydration          Note: This dictation was prepared with Dragon dictation along with smaller phrase technology. Any  transcriptional errors that result from this process are unintentional.

## 2020-02-15 NOTE — Patient Instructions (Addendum)
F/U pending results   

## 2020-02-16 LAB — URINE CULTURE
MICRO NUMBER:: 10431480
Result:: NO GROWTH
SPECIMEN QUALITY:: ADEQUATE

## 2020-02-16 LAB — SARS-COV-2 RNA,(COVID-19) QUALITATIVE NAAT: SARS CoV2 RNA: NOT DETECTED

## 2020-04-05 ENCOUNTER — Ambulatory Visit (INDEPENDENT_AMBULATORY_CARE_PROVIDER_SITE_OTHER)
Admission: EM | Admit: 2020-04-05 | Discharge: 2020-04-05 | Disposition: A | Discharge status: Home / Self Care | Payer: Medicare Other | Source: Home / Self Care

## 2020-04-05 ENCOUNTER — Emergency Department (HOSPITAL_COMMUNITY): Payer: Medicare Other

## 2020-04-05 ENCOUNTER — Other Ambulatory Visit: Payer: Self-pay

## 2020-04-05 ENCOUNTER — Emergency Department (HOSPITAL_COMMUNITY)
Admission: EM | Admit: 2020-04-05 | Discharge: 2020-04-06 | Disposition: A | Discharge status: Home / Self Care | Payer: Medicare Other | Attending: Emergency Medicine | Admitting: Emergency Medicine

## 2020-04-05 ENCOUNTER — Encounter (HOSPITAL_COMMUNITY): Payer: Self-pay | Admitting: *Deleted

## 2020-04-05 DIAGNOSIS — R509 Fever, unspecified: Secondary | ICD-10-CM | POA: Insufficient documentation

## 2020-04-05 DIAGNOSIS — R05 Cough: Secondary | ICD-10-CM | POA: Diagnosis not present

## 2020-04-05 DIAGNOSIS — Z7982 Long term (current) use of aspirin: Secondary | ICD-10-CM | POA: Insufficient documentation

## 2020-04-05 DIAGNOSIS — Z8673 Personal history of transient ischemic attack (TIA), and cerebral infarction without residual deficits: Secondary | ICD-10-CM | POA: Insufficient documentation

## 2020-04-05 DIAGNOSIS — Z20822 Contact with and (suspected) exposure to covid-19: Secondary | ICD-10-CM | POA: Insufficient documentation

## 2020-04-05 DIAGNOSIS — Z888 Allergy status to other drugs, medicaments and biological substances status: Secondary | ICD-10-CM | POA: Diagnosis not present

## 2020-04-05 LAB — LACTIC ACID, PLASMA: Lactic Acid, Venous: 1.8 mmol/L (ref 0.5–1.9)

## 2020-04-05 LAB — CBC WITH DIFFERENTIAL/PLATELET
Abs Immature Granulocytes: 0.03 10*3/uL (ref 0.00–0.07)
Basophils Absolute: 0 10*3/uL (ref 0.0–0.1)
Basophils Relative: 0 %
Eosinophils Absolute: 0 10*3/uL (ref 0.0–0.5)
Eosinophils Relative: 0 %
HCT: 46.5 % (ref 39.0–52.0)
Hemoglobin: 15 g/dL (ref 13.0–17.0)
Immature Granulocytes: 0 %
Lymphocytes Relative: 7 %
Lymphs Abs: 0.8 10*3/uL (ref 0.7–4.0)
MCH: 28.3 pg (ref 26.0–34.0)
MCHC: 32.3 g/dL (ref 30.0–36.0)
MCV: 87.7 fL (ref 80.0–100.0)
Monocytes Absolute: 0.9 10*3/uL (ref 0.1–1.0)
Monocytes Relative: 8 %
Neutro Abs: 9.8 10*3/uL — ABNORMAL HIGH (ref 1.7–7.7)
Neutrophils Relative %: 85 %
Platelets: 214 10*3/uL (ref 150–400)
RBC: 5.3 MIL/uL (ref 4.22–5.81)
RDW: 14.7 % (ref 11.5–15.5)
WBC: 11.6 10*3/uL — ABNORMAL HIGH (ref 4.0–10.5)
nRBC: 0 % (ref 0.0–0.2)

## 2020-04-05 LAB — BASIC METABOLIC PANEL
Anion gap: 11 (ref 5–15)
BUN: 9 mg/dL (ref 8–23)
CO2: 21 mmol/L — ABNORMAL LOW (ref 22–32)
Calcium: 9.5 mg/dL (ref 8.9–10.3)
Chloride: 104 mmol/L (ref 98–111)
Creatinine, Ser: 0.9 mg/dL (ref 0.61–1.24)
GFR calc Af Amer: >60 mL/min (ref >60)
GFR calc non Af Amer: >60 mL/min (ref >60)
Glucose, Bld: 153 mg/dL — ABNORMAL HIGH (ref 70–99)
Potassium: 3.5 mmol/L (ref 3.5–5.1)
Sodium: 136 mmol/L (ref 135–145)

## 2020-04-05 LAB — URINALYSIS, ROUTINE W REFLEX MICROSCOPIC
Bilirubin Urine: NEGATIVE
Glucose, UA: NEGATIVE mg/dL
Ketones, ur: 20 mg/dL — AB
Leukocytes,Ua: NEGATIVE
Nitrite: NEGATIVE
Protein, ur: NEGATIVE mg/dL
Specific Gravity, Urine: 1.012 (ref 1.005–1.030)
pH: 5 (ref 5.0–8.0)

## 2020-04-05 MED ORDER — IBUPROFEN 800 MG PO TABS
800.0000 mg | ORAL_TABLET | Freq: Once | ORAL | Status: AC
  Administered 2020-04-05: 800 mg via ORAL

## 2020-04-05 MED ORDER — SODIUM CHLORIDE 0.9 % IV BOLUS
1000.0000 mL | Freq: Once | INTRAVENOUS | Status: AC
  Administered 2020-04-05: 1000 mL via INTRAVENOUS

## 2020-04-05 NOTE — ED Provider Notes (Signed)
[place]   [id number] 04/05/20 Arrival Time: 1025  CC: FEVER  SUBJECTIVE: History from: patient.  Justin [name] is a 69 y.o. male who presents with complaint of headache, fever, chills, and body aches that began today.  Tmax as home was 103 at home, 100.8 in office.  Denies precipitating event or positive sick exposure.  Has tried OTC tylenol with relief.  Denies aggravating or alleviating factors.  Reports similar symptoms in the past.  Had work-up done with PCP and was unremarkable   Denies rhinorrhea, congestion, sore throat, cough, abdominal pain, changes in bowel or bladder function, CP, SOB.    Immunization History  Administered Date(s) Administered   Pneumococcal Conjugate-13 02/10/2019   Tdap 11/24/2014, 03/22/2015   Zoster 12/14/2015   ROS: As per HPI.  All other pertinent ROS negative.     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  Stroke 03/2015 - Right side effective- "mopst of strenghthas come back."   Stroke [facility]) 2016   TIA (transient ischemic attack) 04/21/2013   Urticaria    Vision abnormalities    right eye vision   Past Surgical History:  Procedure Laterality Date   COLONOSCOPY N/A 07/13/2013   Procedure: COLONOSCOPY;  Surgeon: Danie Binder, MD;  [facility];  Service: Endoscopy;  Laterality: N/A;  8:30 AM   EP IMPLANTABLE DEVICE N/A 07/02/2016   Procedure: Loop Recorder Removal;  Surgeon: Deboraha Sprang, MD;  Location: [place] [facility];  Service: Cardiovascular;  Laterality: N/A;   EXTERNAL FIXATION LEG Left 03/22/2015   Procedure: EXTERNAL FIXATION  left ankle;  Surgeon: Rod Can, MD;  Location: [place];  Service: Orthopedics;  Laterality: Left;   EXTERNAL FIXATION LEG Left 03/24/2015   Procedure: IRRIGATION AND DEBRIDEMENT  EXTERNAL FIXATURE REVISION LEFT ANKLE ;  Surgeon: Altamese [place], MD;  Location: [place];  Service: Orthopedics;  Laterality: Left;   EXTERNAL FIXATION REMOVAL Left 05/23/2015   Procedure: REMOVAL EXTERNAL FIXATION LEFT LEG;  Surgeon: Altamese [place], MD;  Location: [place];  Service: Orthopedics;  Laterality: Left;   I & D EXTREMITY Left 03/22/2015   Procedure: IRRIGATION AND DEBRIDEMENT OPEN TIBIA -FIBULA  FRACTURE;  Surgeon: Rod Can, MD;  Location: [place];  Service: Orthopedics;  Laterality: Left;   implanted loop recorder     LOOP RECORDER IMPLANT N/A 04/24/2013   Procedure: LOOP RECORDER IMPLANT;  Surgeon: Deboraha Sprang, MD;  Location: [facility] CATH LAB;  Service: Cardiovascular;  Laterality: N/A;   ORIF TIBIA FRACTURE Left 05/29/2016   Procedure: OPEN REDUCTION INTERNAL FIXATION (ORIF)  REPAIR LEFT DISTAL TIBIAL NON UNION ILLIAC CREST BONE GRAFT;  Surgeon: Altamese Olar, MD;  Location: [place];  Service: Orthopedics;  Laterality: Left;   SHOULDER CLOSED REDUCTION Left 03/22/2015   Procedure: CLOSED REDUCTION SHOULDER;  Surgeon: Rod Can, MD;  Location: [place];  Service: Orthopedics;  Laterality: Left;   TEE WITHOUT CARDIOVERSION N/A 04/24/2013   Procedure: TRANSESOPHAGEAL ECHOCARDIOGRAM (TEE);  Surgeon: Thayer Headings, MD;  Location: [place];  Service: Cardiovascular;  Laterality: N/A;   Allergies  Allergen  Reactions   Plavix [Clopidogrel] Swelling and Anxiety    Nocturnal Restlessness, rash TONGUE SWELLING   Lyrica [Pregabalin] Rash    Edema EDEMA REACTION UNSPECIFIED     No current facility-administered medications on file prior to encounter.   Current Outpatient Medications on File Prior to Encounter  Medication Sig Dispense Refill   ACYCLOVIR PO Take 800 mg by mouth as needed (when pt bell  palsy fares up). Reported on 12/02/2015     aspirin 325 MG tablet Take 325 mg by mouth daily.     Social History   Socioeconomic History   Marital status: Married    Spouse name: Not on file   Number of children: 3   Years of education: college   Highest education level: Not on file  Occupational History   Occupation: [employer]    Employer: [employer]  Tobacco Use   Smoking status: Never Smoker   Smokeless tobacco: Never Used  Substance and Sexual Activity   Alcohol use: No    Comment: 03/22/2013 "glass of wine couple times/yr"   Drug use: No   Sexual activity: Yes  Other Topics Concern   Not on file  Social History Narrative   Not on file   Social Determinants of Health   Financial Resource Strain:    Difficulty of Paying Living Expenses:   Food Insecurity:    Worried About Charity fundraiser in the Last Year:    Arboriculturist in the Last Year:   Transportation Needs:    Film/video editor (Medical):    Lack of Transportation (Non-Medical):   Physical Activity:    Days of Exercise per Week:    Minutes of Exercise per Session:   Stress:    Feeling of Stress :   Social Connections:    Frequency of Communication with Friends and Family:    Frequency of Social Gatherings with Friends and Family:    Attends Religious Services:    Active Member of Clubs or Organizations:    Attends Music therapist:    Marital Status:   Intimate Partner Violence:    Fear of Current or Ex-Partner:    Emotionally Abused:    Physically Abused:    Sexually Abused:    Family History  Problem Relation Age of Onset   Heart disease Mother    Alcohol abuse Mother    Colon cancer Neg Hx     OBJECTIVE:  Vitals:   04/05/20 1612  BP: (!) 154/89  Pulse: (!) 143  Resp: 17  Temp: (!) 100.8 F (38.2 C)  TempSrc: Oral  SpO2: 90%     General appearance: alert; fatigue appearing, nontoxic appearance HEENT: NCAT; Ears: EACs clear,  TMs pearly gray; Eyes: EOM grossly intact. Nose: no rhinorrhea without nasal flaring; Throat: oropharynx clear, tonsils not enlarged or erythematous, uvula midline Neck: supple without LAD Lungs: CTA bilaterally without adventitious breath sounds; normal respiratory effort, no cough present Heart: Tachycardic Abdomen: soft; normal active bowel sounds; nontender to palpation Skin: warm and dry; no obvious rashes Psychological: alert and cooperative; normal mood and affect  ASSESSMENT & PLAN:  1. Fever, unspecified     Meds ordered this encounter  Medications   ibuprofen (ADVIL) tablet 800 mg    Recommending further evaluation and management in the ED for fever of 103.  Patient would like further work-up than available in Urgent Care at this time.  Would like to defer testing to the ER.  Patient with  fever of 100.8 in office and heart rate of 143 bpm.  Patient and wife aware.  Will go by private vehicle to ED.            Lestine Box, PA-C 04/05/20 1628

## 2020-04-05 NOTE — ED Provider Notes (Signed)
[facility] EMERGENCY DEPARTMENT Provider Note   CSN: [id number] Arrival date & time: 04/05/20  1656     History Chief Complaint  Patient presents with  . Fever    Justin [name] is a 69 y.o. male.  Patient with history of previous stroke, PFO, hyperlipidemia presenting with a 1 day history of fever.  States symptoms started last night with chills and body aches and headache.  Fever to 100.8 last night.  Fever to 103 today.  He was seen in urgent care and referred to the ED.  He has had some chills and congestion.  No cough, runny nose or sore throat.  No nausea, vomiting or diarrhea.  No chest pain or shortness of breath.  Has a headache earlier which is since improved after taking Tylenol.  Was tachycardic in urgent care and was sent to the ED.  He says he has a poor appetite and feels nauseated but no vomiting.  No abdominal pain, chest pain, shortness of breath.  Wife states similar episode 2 months ago and was seen by PCP without source.  She was concerned about possible tickborne illness but denies any recent known tick exposures.  Did Not receive Covid vaccine.  No known Covid exposures.  No rashes  The history is provided by the patient and a relative.       Past Medical History:  Diagnosis Date  . Angio-edema   . Bell's palsy 1980's; [id number]; 2000's; 12/2012; 03/2013   "multiple times" (04/21/2013)  . Chronic anticoagulation 05/30/2016  . Dislocation of left shoulder joint 03/31/2015  . Hypercholesteremia   . 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 PFO (patent foramen ovale)    PFO by 04/24/13 TEE  . Stroke ([place]) 2014   No residual from 2014.  Stroke 03/2015 - Right side effective- "mopst of strenghthas come back."  . Stroke ([place]) 2016  . TIA (transient ischemic attack) 04/21/2013  . Urticaria   . Vision abnormalities    right eye vision    Patient Active Problem List   Diagnosis Date Noted  . Recurrent urticaria 11/13/2016  . PFO (patent  foramen ovale)   . H/O: CVA (cerebrovascular accident) 04/22/2013  . Bell's palsy 04/22/2013    Past Surgical History:  Procedure Laterality Date  . COLONOSCOPY N/A 07/13/2013   Procedure: COLONOSCOPY;  Surgeon: Danie Binder, MD;  [facility];  Service: Endoscopy;  Laterality: N/A;  8:30 AM  . EP IMPLANTABLE DEVICE N/A 07/02/2016   Procedure: Loop Recorder Removal;  Surgeon: Deboraha Sprang, MD;  Location: [place] [facility] LAB;  Service: Cardiovascular;  Laterality: N/A;  . EXTERNAL FIXATION LEG Left 03/22/2015   Procedure: EXTERNAL FIXATION left ankle;  Surgeon: Rod Can, MD;  Location: [place];  Service: Orthopedics;  Laterality: Left;  . EXTERNAL FIXATION LEG Left 03/24/2015   Procedure: IRRIGATION AND DEBRIDEMENT  EXTERNAL FIXATURE REVISION LEFT ANKLE ;  Surgeon: Altamese [place]ton, MD;  Location: [place];  Service: Orthopedics;  Laterality: Left;  . EXTERNAL FIXATION REMOVAL Left 05/23/2015   Procedure: REMOVAL EXTERNAL FIXATION LEFT LEG;  Surgeon: Altamese [place], MD;  Location: [place];  Service: Orthopedics;  Laterality: Left;  . I & D EXTREMITY Left 03/22/2015   Procedure: IRRIGATION AND DEBRIDEMENT OPEN TIBIA -FIBULA  FRACTURE;  Surgeon: Rod Can, MD;  Location: [place];  Service: Orthopedics;  Laterality: Left;  . implanted loop recorder    . LOOP RECORDER IMPLANT N/A 04/24/2013  Procedure: LOOP RECORDER IMPLANT;  Surgeon: Deboraha Sprang, MD;  Location: [facility] CATH LAB;  Service: Cardiovascular;  Laterality: N/A;  . ORIF TIBIA FRACTURE Left 05/29/2016   Procedure: OPEN REDUCTION INTERNAL FIXATION (ORIF)  REPAIR LEFT DISTAL TIBIAL NON UNION ILLIAC CREST BONE GRAFT;  Surgeon: Altamese [place], MD;  Location: [place];  Service: Orthopedics;  Laterality: Left;  . SHOULDER CLOSED REDUCTION Left 03/22/2015   Procedure: CLOSED REDUCTION SHOULDER;  Surgeon: Rod Can, MD;  Location: [place];  Service: Orthopedics;  Laterality: Left;  . TEE WITHOUT CARDIOVERSION N/A 04/24/2013   Procedure:  TRANSESOPHAGEAL ECHOCARDIOGRAM (TEE);  Surgeon: Thayer Headings, MD;  Location: [facility] ENDOSCOPY;  Service: Cardiovascular;  Laterality: N/A;       Family History  Problem Relation Age of Onset  . Heart disease Mother   . Alcohol abuse Mother   . Colon cancer Neg Hx     Social History   Tobacco Use  . Smoking status: Never Smoker  . Smokeless tobacco: Never Used  Substance Use Topics  . Alcohol use: No    Comment: 03/22/2013 "glass of wine couple times/yr"  . Drug use: No    Home Medications Prior to Admission medications   Medication Sig Start Date End Date Taking? Authorizing Provider  ACYCLOVIR PO Take 800 mg by mouth as needed (when pt bell palsy fares up). Reported on 12/02/2015    [provider]  aspirin 325 MG tablet Take 325 mg by mouth daily.    [provider]    Allergies    Plavix [clopidogrel] and Lyrica [pregabalin]  Review of Systems   Review of Systems  Constitutional: Positive for activity change, appetite change, chills, fatigue and fever.  HENT: Positive for congestion. Negative for rhinorrhea and sore throat.   Respiratory: Negative for cough, chest tightness, shortness of breath and stridor.   Cardiovascular: Negative for chest pain.  Gastrointestinal: Negative for abdominal pain, nausea and vomiting.  Genitourinary: Negative for dysuria and hematuria.  Musculoskeletal: Positive for arthralgias and myalgias.  Skin: Negative for rash and wound.  Neurological: Positive for weakness and headaches.   all other systems are negative except as noted in the HPI and PMH.   Physical Exam Updated Vital Signs BP 127/82 (BP Location: Left Arm)   Pulse 78   Temp 98.2 F (36.8 C) (Oral)   Resp 18   Ht 5\' 9"  (1.753 m)   Wt 83.9 kg   SpO2 96%   BMI 27.32 kg/m   Physical Exam Vitals and nursing note reviewed.  Constitutional:      General: He is not in acute distress.    Appearance: Normal appearance. He is well-developed and normal  weight.     Comments: Appears well nontoxic  HENT:     Head: Normocephalic and atraumatic.     Right Ear: Tympanic membrane normal.     Left Ear: Tympanic membrane normal.     Nose: Congestion present.     Mouth/Throat:     Mouth: Mucous membranes are moist.     Pharynx: No oropharyngeal exudate or posterior oropharyngeal erythema.  Eyes:     Conjunctiva/sclera: Conjunctivae normal.     Pupils: Pupils are equal, round, and reactive to light.  Neck:     Comments: No meningismus. Supple, no meningismus, full range of motion Cardiovascular:     Rate and Rhythm: Normal rate and regular rhythm.     Heart sounds: Normal heart sounds. No murmur heard.   Pulmonary:  Effort: Pulmonary effort is normal. No respiratory distress.     Breath sounds: Normal breath sounds.  Abdominal:     Palpations: Abdomen is soft.     Tenderness: There is no abdominal tenderness. There is no guarding or rebound.  Musculoskeletal:        General: No tenderness. Normal range of motion.     Cervical back: Normal range of motion and neck supple.  Skin:    General: Skin is warm.     Capillary Refill: Capillary refill takes less than 2 seconds.     Findings: No rash.  Neurological:     General: No focal deficit present.     Mental Status: He is alert and oriented to person, place, and time. Mental status is at baseline.     Cranial Nerves: No cranial nerve deficit.     Motor: No abnormal muscle tone.     Coordination: Coordination normal.     Comments:  5/5 strength throughout. CN 2-12 intact.Equal grip strength.   Psychiatric:        Behavior: Behavior normal.     ED Results / Procedures / Treatments   Labs (all labs ordered are listed, but only abnormal results are displayed) Labs Reviewed  CBC WITH DIFFERENTIAL/PLATELET - Abnormal; Notable for the following components:      Result Value   WBC 11.6 (*)    Neutro Abs 9.8 (*)    All other components within normal limits  BASIC METABOLIC PANEL -  Abnormal; Notable for the following components:   CO2 21 (*)    Glucose, Bld 153 (*)    All other components within normal limits  URINALYSIS, ROUTINE W REFLEX MICROSCOPIC - Abnormal; Notable for the following components:   Hgb urine dipstick MODERATE (*)    Ketones, ur 20 (*)    Bacteria, UA RARE (*)    All other components within normal limits  CULTURE, BLOOD (ROUTINE X 2)  CULTURE, BLOOD (ROUTINE X 2)  SARS CORONAVIRUS 2 BY RT PCR (HOSPITAL ORDER, [place] [facility])  URINE CULTURE  LACTIC ACID, PLASMA  LACTIC ACID, PLASMA  B. BURGDORFI ANTIBODIES  ROCKY MTN SPOTTED FVR ABS PNL(IGG+IGM)    EKG None  Radiology DG Chest 2 View  Result Date: 04/05/2020 CLINICAL DATA:  Fever, cough and body aches. EXAM: CHEST - 2 VIEW COMPARISON:  March 22, 2015 FINDINGS: A trace amount of linear scarring and/or atelectasis is seen along the lateral aspect of the left lung base. There is no evidence of acute infiltrate, pleural effusion or pneumothorax. The heart size and mediastinal contours are within normal limits. The loop recorder device seen on the prior study is no longer present. The visualized skeletal structures are unremarkable. IMPRESSION: No active cardiopulmonary disease. Electronically Signed   By: Virgina Norfolk M.D.   On: 04/05/2020 18:03    Procedures Procedures (including critical care time)  Medications Ordered in ED Medications - No data to display  ED Course  I have reviewed the triage vital signs and the nursing notes.  Pertinent labs & imaging results that were available during my care of the patient were reviewed by me and considered in my medical decision making (see chart for details).    MDM Rules/Calculators/A&P                          1 day history of body aches, fever, headache and chills.  Patient appears well and nontoxic.  No nausea, vomiting, chest pain, shortness of breath.  No cough or runny nose or sore throat. He is not  immunocompromised.  Chest x-ray is negative.  No hypoxia or increased work of breathing.  Covid testing today is negative.  Urinalysis is negative.  Tickborne titers sent per wife requests though denies any recent tick bites.  Patient appears well.  Low suspicion for meningitis.  No headache or pain with range of motion of the neck. Labs reassuring.  Blood and urine cultures pending.  Risks and benefits of lumbar puncture discussed with patient.  He declines at this time.  Feel this is reasonable.  He appears very well and has no meningismus. No headache currently. Suspect viral illness, possibly Covid despite negative test today.  Advised quarantine at home.  Antipyretics.  Follow-up with PCP. Discussed that Covid is still possible despite negative test as he has only been sick for 1 day.  Advised that he can check his tickborne illness titers on his MyChart.  Blood and urine cultures are pending he will be called if these are positive.  Return to the ED with worsening sign clinically persistent fever, difficulty breathing, head or neck pain, confusion, nausea or vomiting or other concerns. Also return to the ED if he changes his mind about LP but low suspicion for meningitis at this time.   Cinch Ormond Dutson was evaluated in Emergency Department on 04/05/2020 for the symptoms described in the history of present illness. He was evaluated in the context of the global COVID-19 pandemic, which necessitated consideration that the patient might be at risk for infection with the SARS-CoV-2 virus that causes COVID-19. Institutional protocols and algorithms that pertain to the evaluation of patients at risk for COVID-19 are in a state of rapid change based on information released by regulatory bodies including the CDC and federal and state organizations. These policies and algorithms were followed during the patient's care in the ED.  Final Clinical Impression(s) / ED Diagnoses Final diagnoses:  Fever,  unspecified fever cause    Rx / DC Orders ED Discharge Orders    None       Darcie Mellone, Annie Main, MD 04/06/20 [phone]

## 2020-04-05 NOTE — ED Triage Notes (Signed)
Pt with fever since yesterday, highest 103.8.  Denies Covid exposure and denies taking covid vaccines. Hx of same back in May and was antibiotics, pt states that they did not find out what caused it.

## 2020-04-05 NOTE — Discharge Instructions (Signed)
Recommending further evaluation and management in the ED for fever of 103.  Patient would like further work-up than available in Urgent Care at this time.  Would like to defer testing to the ER.  Patient with fever of 100.8 in office and heart rate of 143 bpm.  Patient and wife aware.  Will go by private vehicle to ED.

## 2020-04-05 NOTE — ED Triage Notes (Signed)
Pt began feeling bad yesterday with fever of 103

## 2020-04-06 ENCOUNTER — Encounter: Payer: Self-pay | Admitting: Family Medicine

## 2020-04-06 LAB — SARS CORONAVIRUS 2 BY RT PCR (HOSPITAL ORDER, PERFORMED IN ~~LOC~~ HOSPITAL LAB): SARS Coronavirus 2: NEGATIVE

## 2020-04-06 LAB — LACTIC ACID, PLASMA: Lactic Acid, Venous: 1.2 mmol/L (ref 0.5–1.9)

## 2020-04-06 NOTE — Discharge Instructions (Signed)
As we discussed, your fever is likely from a viral illness, possibly Covid despite negative test today.  Keep yourself hydrated.  Use Tylenol or ibuprofen as needed for aches and fever at home.  Follow-up with your doctor.  You may follow-up your blood cultures and tick borne illness testing on MyChart.  You declined lumbar puncture today.  We have a low suspicion for meningitis at this time.  You should return to the ED with worsening headache, fever, confusion, pain with moving your neck, nausea, vomiting, altered mental status or any other concerns.

## 2020-04-06 NOTE — Progress Notes (Signed)
Pt ambulated around ER and drank water without complication.

## 2020-04-07 LAB — URINE CULTURE: Culture: NO GROWTH

## 2020-04-08 LAB — ROCKY MTN SPOTTED FVR ABS PNL(IGG+IGM)
RMSF IgG: NEGATIVE
RMSF IgM: 0.5 index (ref 0.00–0.89)

## 2020-04-08 LAB — B. BURGDORFI ANTIBODIES: B burgdorferi Ab IgG+IgM: <0.91 {ISR} (ref 0.00–0.90)

## 2020-04-12 LAB — CULTURE, BLOOD (ROUTINE X 2)
Culture: NO GROWTH
Culture: NO GROWTH
Special Requests: ADEQUATE
Special Requests: ADEQUATE

## 2020-05-04 DIAGNOSIS — S82872N Displaced pilon fracture of left tibia, subsequent encounter for open fracture type IIIA, IIIB, or IIIC with nonunion: Secondary | ICD-10-CM | POA: Diagnosis not present

## 2020-05-09 ENCOUNTER — Encounter: Payer: Self-pay | Admitting: *Deleted

## 2020-05-17 ENCOUNTER — Encounter: Payer: Self-pay | Admitting: Family Medicine

## 2020-05-17 ENCOUNTER — Other Ambulatory Visit: Payer: Self-pay

## 2020-05-17 ENCOUNTER — Ambulatory Visit (INDEPENDENT_AMBULATORY_CARE_PROVIDER_SITE_OTHER): Payer: Medicare Other | Admitting: Family Medicine

## 2020-05-17 VITALS — BP 130/66 | HR 70 | Temp 97.8°F | Resp 16 | Ht 68.0 in | Wt 190.0 lb

## 2020-05-17 DIAGNOSIS — Z8673 Personal history of transient ischemic attack (TIA), and cerebral infarction without residual deficits: Secondary | ICD-10-CM | POA: Diagnosis not present

## 2020-05-17 DIAGNOSIS — Z125 Encounter for screening for malignant neoplasm of prostate: Secondary | ICD-10-CM

## 2020-05-17 DIAGNOSIS — L989 Disorder of the skin and subcutaneous tissue, unspecified: Secondary | ICD-10-CM | POA: Diagnosis not present

## 2020-05-17 DIAGNOSIS — R509 Fever, unspecified: Secondary | ICD-10-CM

## 2020-05-17 DIAGNOSIS — L738 Other specified follicular disorders: Secondary | ICD-10-CM

## 2020-05-17 DIAGNOSIS — Z13220 Encounter for screening for lipoid disorders: Secondary | ICD-10-CM | POA: Diagnosis not present

## 2020-05-17 NOTE — Assessment & Plan Note (Signed)
History of cryptogenic stroke.  Maintained on aspirin.  He is due for lipid panel.  Still unclear cause of the fevers that he was having for 2 months.  They seem to have resolved over the past month.  He otherwise feels well.  Chest x-ray was negative.  He did not have malignancy work-up or autoimmune work-up.  I will do some of this today even though he is currently symptom-free.    PSA screening to be done for prostate cancer.  Discussed COVID-19 vaccine.  He does have history of cryptogenic stroke his history of blood clot and he is concerned about the possibility though rare with some of the vaccines causing increased clotting factors.  At this time he is going to maintain mask wearing and social distancing

## 2020-05-17 NOTE — Patient Instructions (Addendum)
F/U pending results  Referral to dermatology

## 2020-05-17 NOTE — Progress Notes (Signed)
Subjective:    Patient ID: Justin [name], male    DOB: 1951/01/23, 69 y.o.   MRN: [id number]  Patient presents for Follow-up (is fasting)   Pt here to f/u - He hashad intermittant fever since April 27th  , last fever docmented was in June but still unknown cause. Feels well now, has a few aches and pains, but nothing that stops his regular activity. No chest pain,no SOB  Negative covid testing  negative Chest xray  seen by ortho to r/o septic joint this was negative  Tick titers negative- for Lyme RMSF  Urine studies negative for infection    He took probiotics   He is on vitamin D and ASA 325nmg   History of blood clots, and stroke history, concerned about getting COVID-19 VACCINE   He has a growth on the end of his nose that came up a few months back Gets a lot of sun out working   Review Of Systems:  GEN- denies fatigue, fever, weight loss,weakness, recent illness HEENT- denies eye drainage, change in vision, nasal discharge, CVS- denies chest pain, palpitations RESP- denies SOB, cough, wheeze ABD- denies N/V, change in stools, abd pain GU- denies dysuria, hematuria, dribbling, incontinence MSK-+ joint pain, muscle aches, injury Neuro- denies headache, dizziness, syncope, seizure activity       Objective:    BP 130/66   Pulse 70   Temp 97.8 F (36.6 C) (Temporal)   Resp 16   Ht 5\' 8"  (1.727 m)   Wt 190 lb (86.2 kg)   SpO2 97%   BMI 28.89 kg/m  GEN- NAD, alert and oriented x3,well appearing  HEENT- PERRL, EOMI, non injected sclera, pink conjunctiva, MMM, oropharynx clear Neck- Supple, no thyromegaly CVS- RRR, no murmur RESP-CTAB ABD-NABS,soft,NT,ND Skin- multiple nevi, tip of nose sebacous lesion NT, ruddy color  to face  EXT- No edema Pulses- Radial, DP- 2+        Assessment & Plan:      Problem List Items Addressed This Visit      Unprioritized   H/O: CVA (cerebrovascular accident)    History of cryptogenic stroke.  Maintained on aspirin.   He is due for lipid panel.  Still unclear cause of the fevers that he was having for 2 months.  They seem to have resolved over the past month.  He otherwise feels well.  Chest x-ray was negative.  He did not have malignancy work-up or autoimmune work-up.  I will do some of this today even though he is currently symptom-free.    PSA screening to be done for prostate cancer.  Discussed COVID-19 vaccine.  He does have history of cryptogenic stroke his history of blood clot and he is concerned about the possibility though rare with some of the vaccines causing increased clotting factors.  At this time he is going to maintain mask wearing and social distancing       Other Visit Diagnoses    Fever, unknown origin    -  Primary   Relevant Orders   CBC with Differential/Platelet   Comprehensive metabolic panel   TSH   Sedimentation Rate   C-reactive protein   Rheumatoid factor   Prostate cancer screening       Relevant Orders   PSA   Screening cholesterol level       Relevant Orders   Lipid panel   Sebaceous hyperplasia of face       Relevant Orders   Ambulatory referral to Dermatology  Skin lesion of face       Relevant Orders   Ambulatory referral to Dermatology      Note: This dictation was prepared with Dragon dictation along with smaller phrase technology. Any transcriptional errors that result from this process are unintentional.

## 2020-05-18 LAB — CBC WITH DIFFERENTIAL/PLATELET
Absolute Monocytes: 518 cells/uL (ref 200–950)
Basophils Absolute: 59 cells/uL (ref 0–200)
Basophils Relative: 0.8 %
Eosinophils Absolute: 81 cells/uL (ref 15–500)
Eosinophils Relative: 1.1 %
HCT: 48.1 % (ref 38.5–50.0)
Hemoglobin: 15.6 g/dL (ref 13.2–17.1)
Lymphs Abs: 2242 cells/uL (ref 850–3900)
MCH: 28.7 pg (ref 27.0–33.0)
MCHC: 32.4 g/dL (ref 32.0–36.0)
MCV: 88.6 fL (ref 80.0–100.0)
MPV: 9.5 fL (ref 7.5–12.5)
Monocytes Relative: 7 %
Neutro Abs: 4499 cells/uL (ref 1500–7800)
Neutrophils Relative %: 60.8 %
Platelets: 252 10*3/uL (ref 140–400)
RBC: 5.43 10*6/uL (ref 4.20–5.80)
RDW: 13.2 % (ref 11.0–15.0)
Total Lymphocyte: 30.3 %
WBC: 7.4 10*3/uL (ref 3.8–10.8)

## 2020-05-18 LAB — LIPID PANEL
Cholesterol: 151 mg/dL (ref <200)
HDL: 52 mg/dL (ref >=40)
LDL Cholesterol (Calc): 80 mg/dL (calc)
Non-HDL Cholesterol (Calc): 99 mg/dL (calc) (ref <130)
Total CHOL/HDL Ratio: 2.9 (calc) (ref <5.0)
Triglycerides: 105 mg/dL (ref <150)

## 2020-05-18 LAB — COMPREHENSIVE METABOLIC PANEL
AG Ratio: 1.7 (calc) (ref 1.0–2.5)
ALT: 18 U/L (ref 9–46)
AST: 18 U/L (ref 10–35)
Albumin: 4.5 g/dL (ref 3.6–5.1)
Alkaline phosphatase (APISO): 93 U/L (ref 35–144)
BUN: 17 mg/dL (ref 7–25)
CO2: 24 mmol/L (ref 20–32)
Calcium: 9.5 mg/dL (ref 8.6–10.3)
Chloride: 107 mmol/L (ref 98–110)
Creat: 1.07 mg/dL (ref 0.70–1.25)
Globulin: 2.6 g/dL (calc) (ref 1.9–3.7)
Glucose, Bld: 82 mg/dL (ref 65–99)
Potassium: 4.3 mmol/L (ref 3.5–5.3)
Sodium: 141 mmol/L (ref 135–146)
Total Bilirubin: 0.4 mg/dL (ref 0.2–1.2)
Total Protein: 7.1 g/dL (ref 6.1–8.1)

## 2020-05-18 LAB — SEDIMENTATION RATE: Sed Rate: 2 mm/h (ref 0–20)

## 2020-05-18 LAB — C-REACTIVE PROTEIN: CRP: 2.5 mg/L (ref <8.0)

## 2020-05-18 LAB — PSA: PSA: 0.3 ng/mL (ref <=4.0)

## 2020-05-18 LAB — RHEUMATOID FACTOR: Rheumatoid fact SerPl-aCnc: <14 IU/mL (ref <14)

## 2020-05-18 LAB — TSH: TSH: 2.92 mIU/L (ref 0.40–4.50)

## 2020-05-31 ENCOUNTER — Encounter: Payer: Self-pay | Admitting: Family Medicine

## 2020-06-03 DIAGNOSIS — C44311 Basal cell carcinoma of skin of nose: Secondary | ICD-10-CM | POA: Diagnosis not present

## 2020-07-01 DIAGNOSIS — L821 Other seborrheic keratosis: Secondary | ICD-10-CM | POA: Diagnosis not present

## 2020-07-01 DIAGNOSIS — D2239 Melanocytic nevi of other parts of face: Secondary | ICD-10-CM | POA: Diagnosis not present

## 2020-07-01 DIAGNOSIS — Z08 Encounter for follow-up examination after completed treatment for malignant neoplasm: Secondary | ICD-10-CM | POA: Diagnosis not present

## 2020-07-01 DIAGNOSIS — Z85828 Personal history of other malignant neoplasm of skin: Secondary | ICD-10-CM | POA: Diagnosis not present

## 2020-10-28 DIAGNOSIS — Z85828 Personal history of other malignant neoplasm of skin: Secondary | ICD-10-CM | POA: Diagnosis not present

## 2020-10-28 DIAGNOSIS — D225 Melanocytic nevi of trunk: Secondary | ICD-10-CM | POA: Diagnosis not present

## 2020-10-28 DIAGNOSIS — Z08 Encounter for follow-up examination after completed treatment for malignant neoplasm: Secondary | ICD-10-CM | POA: Diagnosis not present

## 2021-02-09 ENCOUNTER — Telehealth: Payer: Medicare Other | Admitting: Nurse Practitioner

## 2021-06-30 DIAGNOSIS — Z08 Encounter for follow-up examination after completed treatment for malignant neoplasm: Secondary | ICD-10-CM | POA: Diagnosis not present

## 2021-06-30 DIAGNOSIS — Z85828 Personal history of other malignant neoplasm of skin: Secondary | ICD-10-CM | POA: Diagnosis not present

## 2022-01-30 DIAGNOSIS — Z8673 Personal history of transient ischemic attack (TIA), and cerebral infarction without residual deficits: Secondary | ICD-10-CM | POA: Diagnosis not present

## 2022-01-30 DIAGNOSIS — I10 Essential (primary) hypertension: Secondary | ICD-10-CM | POA: Diagnosis not present

## 2022-01-30 DIAGNOSIS — Z299 Encounter for prophylactic measures, unspecified: Secondary | ICD-10-CM | POA: Diagnosis not present

## 2022-01-30 DIAGNOSIS — E559 Vitamin D deficiency, unspecified: Secondary | ICD-10-CM | POA: Diagnosis not present

## 2022-01-30 DIAGNOSIS — Z789 Other specified health status: Secondary | ICD-10-CM | POA: Diagnosis not present

## 2022-03-06 DIAGNOSIS — Z7189 Other specified counseling: Secondary | ICD-10-CM | POA: Diagnosis not present

## 2022-03-06 DIAGNOSIS — Z1331 Encounter for screening for depression: Secondary | ICD-10-CM | POA: Diagnosis not present

## 2022-03-06 DIAGNOSIS — Z299 Encounter for prophylactic measures, unspecified: Secondary | ICD-10-CM | POA: Diagnosis not present

## 2022-03-06 DIAGNOSIS — Z789 Other specified health status: Secondary | ICD-10-CM | POA: Diagnosis not present

## 2022-03-06 DIAGNOSIS — Z1339 Encounter for screening examination for other mental health and behavioral disorders: Secondary | ICD-10-CM | POA: Diagnosis not present

## 2022-03-06 DIAGNOSIS — Z0000 Encounter for general adult medical examination without abnormal findings: Secondary | ICD-10-CM | POA: Diagnosis not present

## 2022-03-06 DIAGNOSIS — Z125 Encounter for screening for malignant neoplasm of prostate: Secondary | ICD-10-CM | POA: Diagnosis not present

## 2022-03-16 DIAGNOSIS — R5382 Chronic fatigue, unspecified: Secondary | ICD-10-CM | POA: Diagnosis not present

## 2022-03-16 DIAGNOSIS — Z8673 Personal history of transient ischemic attack (TIA), and cerebral infarction without residual deficits: Secondary | ICD-10-CM | POA: Diagnosis not present

## 2022-03-16 DIAGNOSIS — Z0000 Encounter for general adult medical examination without abnormal findings: Secondary | ICD-10-CM | POA: Diagnosis not present

## 2022-07-03 DIAGNOSIS — Z85828 Personal history of other malignant neoplasm of skin: Secondary | ICD-10-CM | POA: Diagnosis not present

## 2022-07-03 DIAGNOSIS — Z08 Encounter for follow-up examination after completed treatment for malignant neoplasm: Secondary | ICD-10-CM | POA: Diagnosis not present

## 2022-07-03 DIAGNOSIS — Z1283 Encounter for screening for malignant neoplasm of skin: Secondary | ICD-10-CM | POA: Diagnosis not present

## 2022-07-03 DIAGNOSIS — D225 Melanocytic nevi of trunk: Secondary | ICD-10-CM | POA: Diagnosis not present

## 2022-07-03 DIAGNOSIS — L820 Inflamed seborrheic keratosis: Secondary | ICD-10-CM | POA: Diagnosis not present

## 2023-03-15 DIAGNOSIS — Z1331 Encounter for screening for depression: Secondary | ICD-10-CM | POA: Diagnosis not present

## 2023-03-15 DIAGNOSIS — Z0000 Encounter for general adult medical examination without abnormal findings: Secondary | ICD-10-CM | POA: Diagnosis not present

## 2023-03-15 DIAGNOSIS — M5451 Vertebrogenic low back pain: Secondary | ICD-10-CM | POA: Diagnosis not present

## 2023-03-15 DIAGNOSIS — Z299 Encounter for prophylactic measures, unspecified: Secondary | ICD-10-CM | POA: Diagnosis not present

## 2023-03-15 DIAGNOSIS — Z8673 Personal history of transient ischemic attack (TIA), and cerebral infarction without residual deficits: Secondary | ICD-10-CM | POA: Diagnosis not present

## 2023-03-15 DIAGNOSIS — Z7189 Other specified counseling: Secondary | ICD-10-CM | POA: Diagnosis not present

## 2023-03-15 DIAGNOSIS — Z1339 Encounter for screening examination for other mental health and behavioral disorders: Secondary | ICD-10-CM | POA: Diagnosis not present

## 2023-03-15 DIAGNOSIS — Z125 Encounter for screening for malignant neoplasm of prostate: Secondary | ICD-10-CM | POA: Diagnosis not present

## 2023-03-31 DIAGNOSIS — Z1211 Encounter for screening for malignant neoplasm of colon: Secondary | ICD-10-CM | POA: Diagnosis not present

## 2023-03-31 DIAGNOSIS — Z1212 Encounter for screening for malignant neoplasm of rectum: Secondary | ICD-10-CM | POA: Diagnosis not present

## 2023-05-28 ENCOUNTER — Encounter: Payer: Self-pay | Admitting: *Deleted

## 2023-07-05 DIAGNOSIS — D225 Melanocytic nevi of trunk: Secondary | ICD-10-CM | POA: Diagnosis not present

## 2023-07-05 DIAGNOSIS — L570 Actinic keratosis: Secondary | ICD-10-CM | POA: Diagnosis not present

## 2023-07-05 DIAGNOSIS — X32XXXA Exposure to sunlight, initial encounter: Secondary | ICD-10-CM | POA: Diagnosis not present

## 2023-07-05 DIAGNOSIS — Z1283 Encounter for screening for malignant neoplasm of skin: Secondary | ICD-10-CM | POA: Diagnosis not present

## 2024-03-25 DIAGNOSIS — Z7189 Other specified counseling: Secondary | ICD-10-CM | POA: Diagnosis not present

## 2024-03-25 DIAGNOSIS — G510 Bell's palsy: Secondary | ICD-10-CM | POA: Diagnosis not present

## 2024-03-25 DIAGNOSIS — I10 Essential (primary) hypertension: Secondary | ICD-10-CM | POA: Diagnosis not present

## 2024-03-25 DIAGNOSIS — Z299 Encounter for prophylactic measures, unspecified: Secondary | ICD-10-CM | POA: Diagnosis not present

## 2024-03-25 DIAGNOSIS — Z1331 Encounter for screening for depression: Secondary | ICD-10-CM | POA: Diagnosis not present

## 2024-03-25 DIAGNOSIS — Z1339 Encounter for screening examination for other mental health and behavioral disorders: Secondary | ICD-10-CM | POA: Diagnosis not present

## 2024-03-25 DIAGNOSIS — Z0000 Encounter for general adult medical examination without abnormal findings: Secondary | ICD-10-CM | POA: Diagnosis not present

## 2024-03-26 DIAGNOSIS — Z125 Encounter for screening for malignant neoplasm of prostate: Secondary | ICD-10-CM | POA: Diagnosis not present

## 2024-03-26 DIAGNOSIS — Z8673 Personal history of transient ischemic attack (TIA), and cerebral infarction without residual deficits: Secondary | ICD-10-CM | POA: Diagnosis not present

## 2024-03-26 DIAGNOSIS — I639 Cerebral infarction, unspecified: Secondary | ICD-10-CM | POA: Diagnosis not present

## 2024-03-31 DIAGNOSIS — H43813 Vitreous degeneration, bilateral: Secondary | ICD-10-CM | POA: Diagnosis not present

## 2024-03-31 DIAGNOSIS — H2513 Age-related nuclear cataract, bilateral: Secondary | ICD-10-CM | POA: Diagnosis not present

## 2024-03-31 DIAGNOSIS — H5213 Myopia, bilateral: Secondary | ICD-10-CM | POA: Diagnosis not present

## 2024-04-20 ENCOUNTER — Ambulatory Visit (INDEPENDENT_AMBULATORY_CARE_PROVIDER_SITE_OTHER)

## 2024-04-20 ENCOUNTER — Ambulatory Visit
Admission: EM | Admit: 2024-04-20 | Discharge: 2024-04-20 | Disposition: A | Discharge status: Home / Self Care | Attending: Family Medicine | Admitting: Family Medicine

## 2024-04-20 DIAGNOSIS — S92901A Unspecified fracture of right foot, initial encounter for closed fracture: Secondary | ICD-10-CM

## 2024-04-20 DIAGNOSIS — M79671 Pain in right foot: Secondary | ICD-10-CM | POA: Diagnosis not present

## 2024-04-20 DIAGNOSIS — S99911A Unspecified injury of right ankle, initial encounter: Secondary | ICD-10-CM | POA: Diagnosis not present

## 2024-04-20 NOTE — Discharge Instructions (Signed)
 Your x-ray today shows a right midfoot fracture.  As discussed, it was recommended to have dedicated foot films for more detailed evaluation but since you have to follow-up with orthopedics anyways and immobilization would be the recommendation at this time we will forego follow-up images until your orthopedic appointment.  Remain nonweightbearing until able to follow-up with orthopedics, ice, elevation, over-the-counter pain relievers

## 2024-04-20 NOTE — ED Triage Notes (Signed)
 Pt reports right foot pain and swelling x 1 day. Was walking down steps and the foot gave under from under him. Causing injury to the foot.

## 2024-04-21 NOTE — H&P (Signed)
 Surgical History & Physical  Patient Name: Justin [name]  DOB: 1951-05-28  Surgery: Cataract extraction with intraocular lens implant phacoemulsification; Right Eye Surgeon: Marsa Cleverly MD Surgery Date: 04/28/2024 Pre-Op Date: 03/31/2024  HPI: A 36 Yr. old male patient here today for an initial medical exam to have a cataract evaluation. Pt states v8ision has decreased both distance and near gradually, more so in OD than OS. Pt has great difficulty reading small print on books, bottles, and labels. Pt also has difficulty recognizing people at a distance, seeing steps, stairs, and curbs, along with poor night vision. Pt has difficulty with glare on bright sunny days, as well as overall hazy, blurry vision.  Medical History: Cataracts  Heart Problem Stroke  Review of Systems Cardiovascular hole in heart Eyes cataracts Neurological mini stroke All recorded systems are negative except as noted above.  Social Never smoked   Medication Prednisolone-moxiflox-bromfen   Sx/Procedures Broken leg several, Plate and Pins  Drug Allergies  NKDA  History & Physical: Heent: cataracts NECK: supple without bruits LUNGS: lungs clear to auscultation CV: regular rate and rhythm Abdomen: soft and non-tender  Impression & Plan: Assessment: 1.  CATARACT NUCLEAR SCLEROSIS AGE RELATED; Both Eyes (H25.13) 2.  Posterior Vitreous Detachment; Both Eyes (H43.813) 3.  MYOPIA; Both Eyes (H52.13)  Plan: 1.  Cataracts are visually significant and account for the patient's complaints. Discussed all risks, benefits, procedures and recovery, including infection, loss of vision and eye, need for glasses after surgery or additional procedures. Patient understands changing glasses will not improve vision. Patient indicated understanding of procedure. All questions answered. Patient desires to have surgery, recommend phacoemulsification with intraocular lens. Patient to have preliminary testing  necessary (Argos/IOL Master, Mac OCT, TOPO) Educational materials provided: Cataract.  Plan: - Proceed with cataract surgery OD followed by OS - Plan for best distance target with DIB00 - No DM, no fuchs, no prior eye surgery - good dilation - Dextenza  if available  2.  No retinal tear or retinal detachment. I told the patient to contact me ASAP for new onset/worsening of floaters, flashes, or vision loss.  3.  per Dr. Darroll

## 2024-04-22 ENCOUNTER — Encounter (HOSPITAL_COMMUNITY)
Admission: RE | Admit: 2024-04-22 | Discharge: 2024-04-22 | Disposition: A | Discharge status: Home / Self Care | Source: Ambulatory Visit | Attending: Optometry | Admitting: Optometry

## 2024-04-22 ENCOUNTER — Other Ambulatory Visit: Payer: Self-pay

## 2024-04-22 ENCOUNTER — Encounter (HOSPITAL_COMMUNITY): Payer: Self-pay

## 2024-04-22 DIAGNOSIS — H2511 Age-related nuclear cataract, right eye: Secondary | ICD-10-CM | POA: Diagnosis not present

## 2024-04-23 NOTE — ED Provider Notes (Signed)
 [facility]    CSN: [id number] Arrival date & time: 04/20/24  0847      History   Chief Complaint No chief complaint on file.   HPI Justin [name] is a 73 y.o. male.   Patient presenting today with 1 day history of right foot pain, swelling after foot gave out from under him walking down steps.  Does have some swelling and bruising to the outer part of the foot but denies loss of range of motion, numbness, tingling.  Taking over-the-counter pain relievers with mild temporary benefit.    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  Stroke 03/2015 - Right side effective- mopst of strenghthas come back.   Stroke [facility]) 2016   TIA (transient ischemic attack) 04/21/2013   Urticaria    Vision abnormalities    right eye vision    Patient Active Problem List   Diagnosis Date Noted   Recurrent urticaria 11/13/2016   PFO (patent foramen ovale)    H/O: CVA (cerebrovascular accident) 04/22/2013   Bell's palsy 04/22/2013    Past Surgical History:  Procedure Laterality Date   COLONOSCOPY N/A 07/13/2013   Procedure: COLONOSCOPY;  Surgeon: Margo LITTIE Haddock, MD;  Location: [facility];  Service: Endoscopy;  Laterality: N/A;  8:30 AM   EP IMPLANTABLE DEVICE N/A 07/02/2016   Procedure: Loop Recorder Removal;  Surgeon: Elspeth JAYSON Sage, MD;  Location: [facility] INVASIVE CV LAB;  Service: Cardiovascular;  Laterality: N/A;   EXTERNAL FIXATION LEG Left 03/22/2015   Procedure: EXTERNAL FIXATION left ankle;  Surgeon: Redell Shoals, MD;  Location: [facility] OR;  Service: Orthopedics;  Laterality: Left;   EXTERNAL FIXATION LEG Left  03/24/2015   Procedure: IRRIGATION AND DEBRIDEMENT  EXTERNAL FIXATURE REVISION LEFT ANKLE ;  Surgeon: Ozell Bruch, MD;  Location: [facility] OR;  Service: Orthopedics;  Laterality: Left;   EXTERNAL FIXATION REMOVAL Left 05/23/2015   Procedure: REMOVAL EXTERNAL FIXATION LEFT LEG;  Surgeon: Ozell Bruch, MD;  Location: [facility] OR;  Service: Orthopedics;  Laterality: Left;   FRACTURE SURGERY  2016,2017   I & D EXTREMITY Left 03/22/2015   Procedure: IRRIGATION AND DEBRIDEMENT OPEN TIBIA -FIBULA  FRACTURE;  Surgeon: Redell Shoals, MD;  Location: [facility] OR;  Service: Orthopedics;  Laterality: Left;   implanted loop recorder     LOOP RECORDER IMPLANT N/A 04/24/2013   Procedure: LOOP RECORDER IMPLANT;  Surgeon: Elspeth JAYSON Sage, MD;  Location: [facility] CATH LAB;  Service: Cardiovascular;  Laterality: N/A;   ORIF TIBIA FRACTURE Left 05/29/2016   Procedure: OPEN REDUCTION INTERNAL FIXATION (ORIF)  REPAIR LEFT DISTAL TIBIAL NON UNION ILLIAC CREST BONE GRAFT;  Surgeon: Ozell Bruch, MD;  Location: [facility] OR;  Service: Orthopedics;  Laterality: Left;   SHOULDER CLOSED REDUCTION Left 03/22/2015   Procedure: CLOSED REDUCTION SHOULDER;  Surgeon: Redell Shoals, MD;  Location: [facility] OR;  Service: Orthopedics;  Laterality: Left;   TEE WITHOUT CARDIOVERSION N/A 04/24/2013   Procedure: TRANSESOPHAGEAL ECHOCARDIOGRAM (TEE);  Surgeon: Aleene JINNY Passe, MD;  Location: [facility] ENDOSCOPY;  Service: Cardiovascular;  Laterality: N/A;       Home Medications  Prior to Admission medications   Medication Sig Start Date End Date Taking? Authorizing Provider  aspirin  325 MG tablet Take 325 mg by mouth daily.    [provider]  cholecalciferol  (VITAMIN D3) 25 MCG (1000 UNIT) tablet Take 1,000 Units by mouth daily.    [provider]    Family History Family History  Problem Relation Age of Onset   Heart disease Mother    Alcohol  abuse Mother    Colon cancer Neg Hx     Social History Social History   Tobacco Use   Smoking  status: Never   Smokeless tobacco: Never  Vaping Use   Vaping status: Never Used  Substance Use Topics   Alcohol  use: No    Comment: 03/22/2013 glass of wine couple times/yr   Drug use: No     Allergies   Plavix  [clopidogrel ] and Lyrica  [pregabalin ]   Review of Systems Review of Systems PER HPI  Physical Exam Triage Vital Signs ED Triage Vitals  Encounter Vitals Group     BP 04/20/24 0856 (!) 140/89     Girls Systolic BP Percentile --      Girls Diastolic BP Percentile --      Boys Systolic BP Percentile --      Boys Diastolic BP Percentile --      Pulse Rate 04/20/24 0856 79     Resp 04/20/24 0856 18     Temp 04/20/24 0856 98.3 F (36.8 C)     Temp Source 04/20/24 0856 Oral     SpO2 04/20/24 0856 96 %     Weight --      Height --      Head Circumference --      Peak Flow --      Pain Score 04/20/24 0858 2     Pain Loc --      Pain Education --      Exclude from Growth Chart --    No data found.  Updated Vital Signs BP (!) 140/89 (BP Location: Right Arm)   Pulse 79   Temp 98.3 F (36.8 C) (Oral)   Resp 18   SpO2 96%   Visual Acuity Right Eye Distance:   Left Eye Distance:   Bilateral Distance:    Right Eye Near:   Left Eye Near:    Bilateral Near:     Physical Exam Vitals and nursing note reviewed.  Constitutional:      Appearance: Normal appearance.  HENT:     Head: Atraumatic.     Mouth/Throat:     Mouth: Mucous membranes are moist.  Eyes:     Extraocular Movements: Extraocular movements intact.     Conjunctiva/sclera: Conjunctivae normal.  Cardiovascular:     Rate and Rhythm: Normal rate.  Pulmonary:     Effort: Pulmonary effort is normal.  Musculoskeletal:        General: Swelling, tenderness and signs of injury present. No deformity.     Cervical back: Normal range of motion and neck supple.     Comments: Significant tenderness to palpation to the lateral dorsal aspect of the right foot  Skin:    General: Skin is warm and dry.      Findings: No erythema.  Neurological:     Mental Status: He is oriented to person, place, and time.     Comments: Right foot neurovascularly intact  Psychiatric:        Mood and Affect: Mood normal.  Thought Content: Thought content normal.        Judgment: Judgment normal.      UC Treatments / Results  Labs (all labs ordered are listed, but only abnormal results are displayed) Labs Reviewed - No data to display  EKG   Radiology No results found.  Procedures Procedures (including critical care time)  Medications Ordered in UC Medications - No data to display  Initial Impression / Assessment and Plan / UC Course  I have reviewed the triage vital signs and the nursing notes.  Pertinent labs & imaging results that were available during my care of the patient were reviewed by me and considered in my medical decision making (see chart for details).     X-ray today revealing a right midfoot fracture, and radiologist recommending dedicated right foot x-ray but discussed repeat versus awaiting orthopedic follow-up for this further imaging with patient and it was agreed upon with shared decision making to await Ortho follow-up as they will likely want their own dedicated images at that time anyway.  Will proceed with immobilization with a cam boot, he states he already has crutches with him in the car so he does not want us  to provide him with a pair today.  Discussed RICE protocol, over-the-counter pain relievers and Ortho follow-up as soon as possible.  Final Clinical Impressions(s) / UC Diagnoses   Final diagnoses:  Closed fracture of right foot, initial encounter     Discharge Instructions      Your x-ray today shows a right midfoot fracture.  As discussed, it was recommended to have dedicated foot films for more detailed evaluation but since you have to follow-up with orthopedics anyways and immobilization would be the recommendation at this time we will forego  follow-up images until your orthopedic appointment.  Remain nonweightbearing until able to follow-up with orthopedics, ice, elevation, over-the-counter pain relievers    ED Prescriptions   None    PDMP not reviewed this encounter.   Stuart Vernell Norris, [place] 04/23/24 1018

## 2024-04-28 ENCOUNTER — Ambulatory Visit (HOSPITAL_COMMUNITY)
Admission: RE | Admit: 2024-04-28 | Discharge: 2024-04-28 | Disposition: A | Discharge status: Home / Self Care | Attending: Optometry | Admitting: Optometry

## 2024-04-28 ENCOUNTER — Ambulatory Visit (HOSPITAL_COMMUNITY): Admitting: Certified Registered Nurse Anesthetist

## 2024-04-28 ENCOUNTER — Encounter (HOSPITAL_COMMUNITY)
Admission: RE | Disposition: A | Discharge status: Home / Self Care | Payer: Self-pay | Source: Home / Self Care | Attending: Optometry

## 2024-04-28 ENCOUNTER — Other Ambulatory Visit: Payer: Self-pay

## 2024-04-28 ENCOUNTER — Encounter (HOSPITAL_COMMUNITY): Payer: Self-pay | Admitting: Optometry

## 2024-04-28 DIAGNOSIS — Z8673 Personal history of transient ischemic attack (TIA), and cerebral infarction without residual deficits: Secondary | ICD-10-CM | POA: Insufficient documentation

## 2024-04-28 DIAGNOSIS — E7800 Pure hypercholesterolemia, unspecified: Secondary | ICD-10-CM

## 2024-04-28 DIAGNOSIS — H43813 Vitreous degeneration, bilateral: Secondary | ICD-10-CM | POA: Diagnosis not present

## 2024-04-28 DIAGNOSIS — H5711 Ocular pain, right eye: Secondary | ICD-10-CM | POA: Insufficient documentation

## 2024-04-28 DIAGNOSIS — H2511 Age-related nuclear cataract, right eye: Secondary | ICD-10-CM

## 2024-04-28 DIAGNOSIS — H5213 Myopia, bilateral: Secondary | ICD-10-CM | POA: Insufficient documentation

## 2024-04-28 DIAGNOSIS — Q2112 Patent foramen ovale: Secondary | ICD-10-CM | POA: Diagnosis not present

## 2024-04-28 HISTORY — PX: CATARACT EXTRACTION W/PHACO: SHX586

## 2024-04-28 HISTORY — PX: INSERTION, STENT, DRUG-ELUTING, LACRIMAL CANALICULUS: SHX7453

## 2024-04-28 SURGERY — PHACOEMULSIFICATION, CATARACT, WITH IOL INSERTION
Anesthesia: Monitor Anesthesia Care | Site: Eye | Laterality: Right

## 2024-04-28 MED ORDER — DEXAMETHASONE 0.4 MG OP INST
VAGINAL_INSERT | OPHTHALMIC | Status: DC | PRN
  Administered 2024-04-28: .4 mg via OPHTHALMIC

## 2024-04-28 MED ORDER — MOXIFLOXACIN HCL 5 MG/ML IO SOLN
INTRAOCULAR | Status: DC | PRN
  Administered 2024-04-28: .2 mL via INTRACAMERAL

## 2024-04-28 MED ORDER — TROPICAMIDE 1 % OP SOLN
1.0000 [drp] | OPHTHALMIC | Status: AC
  Administered 2024-04-28 (×3): 1 [drp] via OPHTHALMIC

## 2024-04-28 MED ORDER — SIGHTPATH DOSE#1 NA HYALUR & NA CHOND-NA HYALUR IO KIT
PACK | INTRAOCULAR | Status: DC | PRN
  Administered 2024-04-28: 1 via OPHTHALMIC

## 2024-04-28 MED ORDER — STERILE WATER FOR IRRIGATION IR SOLN
Status: DC | PRN
  Administered 2024-04-28: 250 mL

## 2024-04-28 MED ORDER — POVIDONE-IODINE 5 % OP SOLN
OPHTHALMIC | Status: DC | PRN
Start: 2024-04-28 — End: 2024-04-28
  Administered 2024-04-28: 1 via OPHTHALMIC

## 2024-04-28 MED ORDER — PHENYLEPHRINE-KETOROLAC 1-0.3 % IO SOLN
INTRAOCULAR | Status: DC | PRN
  Administered 2024-04-28: 500 mL via OPHTHALMIC

## 2024-04-28 MED ORDER — MIDAZOLAM HCL 2 MG/2ML IJ SOLN
INTRAMUSCULAR | Status: AC
  Filled 2024-04-28: qty 2

## 2024-04-28 MED ORDER — LIDOCAINE HCL (PF) 1 % IJ SOLN
INTRAMUSCULAR | Status: DC | PRN
  Administered 2024-04-28: 1 mL

## 2024-04-28 MED ORDER — DEXAMETHASONE 0.4 MG OP INST
VAGINAL_INSERT | OPHTHALMIC | Status: AC
Start: 2024-04-28 — End: 2024-04-28
  Filled 2024-04-28: qty 1

## 2024-04-28 MED ORDER — SODIUM CHLORIDE 0.9% FLUSH
INTRAVENOUS | Status: DC | PRN
  Administered 2024-04-28: 5 mL via INTRAVENOUS

## 2024-04-28 MED ORDER — PHENYLEPHRINE HCL 2.5 % OP SOLN
1.0000 [drp] | OPHTHALMIC | Status: AC
  Administered 2024-04-28 (×2): 1 [drp] via OPHTHALMIC

## 2024-04-28 MED ORDER — LIDOCAINE HCL 3.5 % OP GEL
1.0000 | Freq: Once | OPHTHALMIC | Status: AC
  Administered 2024-04-28: 1 via OPHTHALMIC

## 2024-04-28 MED ORDER — TETRACAINE HCL 0.5 % OP SOLN
1.0000 [drp] | OPHTHALMIC | Status: AC
  Administered 2024-04-28 (×3): 1 [drp] via OPHTHALMIC

## 2024-04-28 MED ORDER — MIDAZOLAM HCL 2 MG/2ML IJ SOLN
INTRAMUSCULAR | Status: DC | PRN
  Administered 2024-04-28: 2 mg via INTRAVENOUS

## 2024-04-28 MED ORDER — BSS IO SOLN
INTRAOCULAR | Status: DC | PRN
  Administered 2024-04-28: 15 mL via INTRAOCULAR

## 2024-04-28 MED ORDER — LACTATED RINGERS IV SOLN: INTRAVENOUS | Status: DC

## 2024-04-28 SURGICAL SUPPLY — 14 items
CATARACT SUITE SIGHTPATH (MISCELLANEOUS) ×1 IMPLANT
CLOTH BEACON ORANGE TIMEOUT ST (SAFETY) ×1 IMPLANT
DRSG TEGADERM 4X4.75 (GAUZE/BANDAGES/DRESSINGS) ×1 IMPLANT
EYE SHIELD UNIVERSAL CLEAR (GAUZE/BANDAGES/DRESSINGS) IMPLANT
FEE CATARACT SUITE SIGHTPATH (MISCELLANEOUS) ×1 IMPLANT
GLOVE BIOGEL PI IND STRL 7.0 (GLOVE) ×2 IMPLANT
LENS IOL TECNIS EYHANCE 21.5 (Intraocular Lens) IMPLANT
NDL HYPO 18GX1.5 BLUNT FILL (NEEDLE) ×1 IMPLANT
NEEDLE HYPO 18GX1.5 BLUNT FILL (NEEDLE) ×1 IMPLANT
PAD ARMBOARD POSITIONER FOAM (MISCELLANEOUS) ×1 IMPLANT
POSITIONER HEAD 8X9X4 ADT (SOFTGOODS) ×1 IMPLANT
SYR TB 1ML LL NO SAFETY (SYRINGE) ×1 IMPLANT
TAPE SURG TRANSPORE 1 IN (GAUZE/BANDAGES/DRESSINGS) IMPLANT
WATER STERILE IRR 250ML POUR (IV SOLUTION) ×1 IMPLANT

## 2024-04-28 NOTE — Interval H&P Note (Signed)
 History and Physical Interval Note:  04/28/2024 8:00 AM  The H and P was reviewed and updated. The patient was examined.  No changes were found after exam.  The surgical eye was marked.   Trace Cederberg

## 2024-04-28 NOTE — Anesthesia Postprocedure Evaluation (Signed)
 Anesthesia Post Note  Patient: Devlon Dosher Dueitt  Procedure(s) Performed: PHACOEMULSIFICATION, CATARACT, WITH IOL INSERTION (Right: Eye) INSERTION, STENT, DRUG-ELUTING, LACRIMAL CANALICULUS (Right: Eye)  Patient location during evaluation: Phase II Anesthesia Type: MAC Level of consciousness: awake and alert Pain management: pain level controlled Vital Signs Assessment: post-procedure vital signs reviewed and stable Respiratory status: spontaneous breathing, nonlabored ventilation and respiratory function stable Cardiovascular status: blood pressure returned to baseline and stable Postop Assessment: no apparent nausea or vomiting Anesthetic complications: no   There were no known notable events for this encounter.   Last Vitals:  Vitals:   04/28/24 0759 04/28/24 0855  BP: (!) 140/83 110/84  Pulse: 69 71  Resp:  16  Temp: 36.7 C 36.7 C  SpO2: 98% 99%    Last Pain:  Vitals:   04/28/24 0855  TempSrc: Oral  PainSc: 0-No pain                 Olanna Percifield L Karmah Potocki

## 2024-04-28 NOTE — Discharge Instructions (Signed)
 Please discharge patient when stable, will follow up today with Dr. Ilsa Iha at the [facility] office immediately following discharge.  Leave shield in place until visit.  All paperwork with discharge instructions will be given at the office.  [facility] Address:  [address]  [place], [place] [address]  Dr. Chaya Jan Phone: [phone]

## 2024-04-28 NOTE — Op Note (Signed)
 Date of procedure: 04/28/24  Pre-operative diagnosis: Visually significant age-related nuclear cataract, Right Eye (H25.11)  Post-operative diagnosis: Visually significant age-related nuclear cataract, Right Eye H25.11; Ocular Pain and Inflammation, Right eye H57.11  Procedure: Removal of cataract via phacoemulsification and insertion of intra-ocular lens J&J DIBOO +21.5D into the capsular bag of the Right Eye, Dextenza  Implantation into right lower punctum CPT [phone]  Attending surgeon: Marsa Cleverly, MD  Anesthesia: MAC, Topical Akten  Complications: None  Estimated Blood Loss: <43mL (minimal)  Specimens: None  Implants:  Implant Name Type Inv. Item Serial No. Manufacturer Lot No. LRB No. Used Action  LENS IOL TECNIS EYHANCE 21.5 - [id number] Intraocular Lens LENS IOL TECNIS EYHANCE 21.5 [id number] SIGHTPATH  Right 1 Implanted    Indications:  Visually significant age-related cataract, Right Eye  Procedure:  The patient was seen and identified in the pre-operative area. The operative eye was identified and dilated.  The operative eye was marked.  Topical anesthesia was administered to the operative eye.     The patient was then to the operative suite and placed in the supine position.  A timeout was performed confirming the patient, procedure to be performed, and all other relevant information.   The patient's face was prepped and draped in the usual fashion for intra-ocular surgery.  A lid speculum was placed into the operative eye and the surgical microscope moved into place and focused.  A superotemporal paracentesis was created using a 20 gauge paracentesis blade.  BSS mixed with Omidria , followed by 1% lidocaine  was injected into the anterior chamber.  Viscoelastic was injected into the anterior chamber.  A temporal clear-corneal main wound incision was created using a 2.14mm microkeratome.  A continuous curvilinear capsulorrhexis was initiated using an irrigating cystitome and  completed using capsulorrhexis forceps.  Hydrodissection and hydrodeliniation were performed.  Viscoelastic was injected into the anterior chamber.  A phacoemulsification handpiece and a chopper as a second instrument were used to remove the nucleus and epinucleus. The irrigation/aspiration handpiece was used to remove any remaining cortical material.   The capsular bag was reinflated with viscoelastic, checked, and found to be intact.  The intraocular lens was inserted into the capsular bag.  The irrigation/aspiration handpiece was used to remove any remaining viscoelastic.  The clear corneal wound and paracentesis wounds were then hydrated and checked with Weck-Cels to be watertight. Moxifloxacin  was instilled into the anterior chamber.  The lid-speculum and drape were removed. The lower punctum was dilated, and the dextenza  implant was inserted into it. The patient's face was cleaned with a wet and dry 4x4. A clear shield was taped over the eye. The patient was taken to the post-operative care unit in good condition, having tolerated the procedure well.  Post-Op Instructions: The patient will follow up at [facility] for a same day post-operative evaluation and will receive all other orders and instructions.

## 2024-04-28 NOTE — Anesthesia Preprocedure Evaluation (Addendum)
 Anesthesia Evaluation  Patient identified by MRN, date of birth, ID band Patient awake    Reviewed: Allergy & Precautions, H&P , NPO status , Patient's Chart, lab work & pertinent test results, reviewed documented beta blocker date and time   Airway Mallampati: II  TM Distance: >3 FB Neck ROM: full    Dental no notable dental hx. (+) Dental Advisory Given, Teeth Intact   Pulmonary neg pulmonary ROS   Pulmonary exam normal breath sounds clear to auscultation       Cardiovascular Exercise Tolerance: Good Normal cardiovascular exam Rhythm:regular Rate:Normal  PFO   Neuro/Psych Bell's palsy TIA Neuromuscular disease CVA  negative psych ROS   GI/Hepatic negative GI ROS, Neg liver ROS,,,  Endo/Other  negative endocrine ROS    Renal/GU negative Renal ROS  negative genitourinary   Musculoskeletal   Abdominal   Peds  Hematology negative hematology ROS (+)   Anesthesia Other Findings   Reproductive/Obstetrics negative OB ROS                              Anesthesia Physical Anesthesia Plan  ASA: 3  Anesthesia Plan: MAC   Post-op Pain Management: Minimal or no pain anticipated   Induction:   PONV Risk Score and Plan:   Airway Management Planned: Nasal Cannula and Natural Airway  Additional Equipment: None  Intra-op Plan:   Post-operative Plan:   Informed Consent: I have reviewed the patients History and Physical, chart, labs and discussed the procedure including the risks, benefits and alternatives for the proposed anesthesia with the patient or authorized representative who has indicated his/her understanding and acceptance.     Dental Advisory Given  Plan Discussed with: CRNA  Anesthesia Plan Comments:         Anesthesia Quick Evaluation

## 2024-04-28 NOTE — Transfer of Care (Signed)
 Immediate Anesthesia Transfer of Care Note  Patient: Justin [name]  Procedure(s) Performed: PHACOEMULSIFICATION, CATARACT, WITH IOL INSERTION (Right: Eye) INSERTION, STENT, DRUG-ELUTING, LACRIMAL CANALICULUS (Right: Eye)  Patient Location: Short Stay  Anesthesia Type:MAC  Level of Consciousness: awake, alert , and oriented  Airway & Oxygen Therapy: Patient Spontanous Breathing  Post-op Assessment: Report given to RN and Post -op Vital signs reviewed and stable  Post vital signs: Reviewed and stable  Last Vitals:  Vitals Value Taken Time  BP 110/84 04/28/24 08:55  Temp 36.7 C 04/28/24 08:55  Pulse 71 04/28/24 08:55  Resp 16 04/28/24 08:55  SpO2 99 % 04/28/24 08:55    Last Pain:  Vitals:   04/28/24 0855  TempSrc: Oral  PainSc: 0-No pain         Complications: No notable events documented.

## 2024-04-29 ENCOUNTER — Encounter (HOSPITAL_COMMUNITY): Payer: Self-pay | Admitting: Optometry

## 2024-05-04 DIAGNOSIS — M79671 Pain in right foot: Secondary | ICD-10-CM | POA: Diagnosis not present

## 2024-05-05 ENCOUNTER — Encounter (HOSPITAL_COMMUNITY)
Admission: RE | Admit: 2024-05-05 | Discharge: 2024-05-05 | Disposition: A | Discharge status: Home / Self Care | Source: Ambulatory Visit | Attending: Optometry | Admitting: Optometry

## 2024-05-06 DIAGNOSIS — H2512 Age-related nuclear cataract, left eye: Secondary | ICD-10-CM | POA: Diagnosis not present

## 2024-05-06 NOTE — H&P (Signed)
 Surgical History & Physical  Patient Name: Justin [name]  DOB: [birth year]  Surgery: Cataract extraction with intraocular lens implant phacoemulsification; Left Eye Surgeon: Marsa Cleverly MD Surgery Date: 05/08/2024 Pre-Op Date: 05/06/2024  HPI: A 39 Yr. old male patient here today for a 1 week post operative exam s/p CE IOL OD (DOS:04/28/24) and a pre-operative CE IOL exam OS. Pt states vision seemed to be cloudy in OD over the weekend, but now seems to be clearing up. Pt complains of difficulty with hazy, blurry vision OS, including difficulty reading small print on books, bottles, and labels, difficulty reading captions on the television and difficulty recognizing people at a distance. Pt has great difficulty with glare on bright sunny days, as well as difficulty driving at night due to glare, rings, and halos with OS. Pt is compliant using Imprimis drops BID OD.  Medical History: Cataracts  Heart Problem Stroke  Review of Systems Cardiovascular hole in heart Neurological mini stroke All recorded systems are negative except as noted above.  Social Never smoked  Medication Prednisolone-moxiflox-bromfen,  Aspirin   Sx/Procedures Phaco c IOL OD,  Broken leg several, Plate and Pins,   Drug Allergies  NKDA  History & Physical: Heent: cataract NECK: supple without bruits LUNGS: lungs clear to auscultation CV: regular rate and rhythm Abdomen: soft and non-tender  Impression & Plan: Assessment: 1.  CATARACT NUCLEAR SCLEROSIS AGE RELATED; Left Eye (H25.12) 2.  Posterior Vitreous Detachment; Both Eyes (H43.813) 3.  MYOPIA; Both Eyes (H52.13) 4.  CATARACT EXTRACTION STATUS; Right Eye (Z98.41)  Plan: 1.  Cataracts are visually significant and account for the patient's complaints. Discussed all risks, benefits, procedures and recovery, including infection, loss of vision and eye, need for glasses after surgery or additional procedures. Patient understands changing glasses will  not improve vision. Patient indicated understanding of procedure. All questions answered. Patient desires to have surgery, recommend phacoemulsification with intraocular lens. Patient to have preliminary testing necessary ([employer]os/IOL Master, Mac OCT, TOPO) Educational materials provided:Cataract.  Plan: - Proceed with cataract surgery OS when ready - Plan for best distance target with DIB00 - No DM, no fuchs, no prior eye surgery - good dilation - Dextenza  if available  2.  No retinal tear or retinal detachment. I told the patient to contact me ASAP for new onset/worsening of floaters, flashes, or vision loss.  3.  per Dr. Darroll  4.  POW1. Doing well. All post-op precautions discussed and instructions reviewed. Written instructions given

## 2024-05-08 ENCOUNTER — Encounter (HOSPITAL_COMMUNITY)
Admission: RE | Disposition: A | Discharge status: Home / Self Care | Payer: Self-pay | Source: Home / Self Care | Attending: Optometry

## 2024-05-08 ENCOUNTER — Encounter (HOSPITAL_COMMUNITY): Payer: Self-pay | Admitting: Optometry

## 2024-05-08 ENCOUNTER — Ambulatory Visit (HOSPITAL_COMMUNITY): Payer: Self-pay | Admitting: Anesthesiology

## 2024-05-08 ENCOUNTER — Ambulatory Visit (HOSPITAL_COMMUNITY)
Admission: RE | Admit: 2024-05-08 | Discharge: 2024-05-08 | Disposition: A | Discharge status: Home / Self Care | Attending: Optometry | Admitting: Optometry

## 2024-05-08 DIAGNOSIS — I10 Essential (primary) hypertension: Secondary | ICD-10-CM | POA: Diagnosis not present

## 2024-05-08 DIAGNOSIS — H5213 Myopia, bilateral: Secondary | ICD-10-CM | POA: Diagnosis not present

## 2024-05-08 DIAGNOSIS — H5712 Ocular pain, left eye: Secondary | ICD-10-CM | POA: Insufficient documentation

## 2024-05-08 DIAGNOSIS — Z9841 Cataract extraction status, right eye: Secondary | ICD-10-CM | POA: Insufficient documentation

## 2024-05-08 DIAGNOSIS — Z8673 Personal history of transient ischemic attack (TIA), and cerebral infarction without residual deficits: Secondary | ICD-10-CM | POA: Insufficient documentation

## 2024-05-08 DIAGNOSIS — G709 Myoneural disorder, unspecified: Secondary | ICD-10-CM | POA: Diagnosis not present

## 2024-05-08 DIAGNOSIS — H2512 Age-related nuclear cataract, left eye: Secondary | ICD-10-CM | POA: Diagnosis not present

## 2024-05-08 DIAGNOSIS — H43813 Vitreous degeneration, bilateral: Secondary | ICD-10-CM | POA: Insufficient documentation

## 2024-05-08 HISTORY — PX: INSERTION, STENT, DRUG-ELUTING, LACRIMAL CANALICULUS: SHX7453

## 2024-05-08 HISTORY — PX: CATARACT EXTRACTION W/PHACO: SHX586

## 2024-05-08 SURGERY — PHACOEMULSIFICATION, CATARACT, WITH IOL INSERTION
Anesthesia: Monitor Anesthesia Care | Site: Eye | Laterality: Left

## 2024-05-08 MED ORDER — TETRACAINE HCL 0.5 % OP SOLN
1.0000 [drp] | OPHTHALMIC | Status: AC | PRN
  Administered 2024-05-08 (×3): 1 [drp] via OPHTHALMIC

## 2024-05-08 MED ORDER — MIDAZOLAM HCL 2 MG/2ML IJ SOLN
INTRAMUSCULAR | Status: DC | PRN
  Administered 2024-05-08: 2 mg via INTRAVENOUS

## 2024-05-08 MED ORDER — DEXAMETHASONE 0.4 MG OP INST
VAGINAL_INSERT | OPHTHALMIC | Status: DC | PRN
  Administered 2024-05-08: .4 mg via OPHTHALMIC

## 2024-05-08 MED ORDER — STERILE WATER FOR IRRIGATION IR SOLN
Status: DC | PRN
  Administered 2024-05-08: 1

## 2024-05-08 MED ORDER — SIGHTPATH DOSE#1 NA HYALUR & NA CHOND-NA HYALUR IO KIT
PACK | INTRAOCULAR | Status: DC | PRN
  Administered 2024-05-08: 1 via OPHTHALMIC

## 2024-05-08 MED ORDER — TROPICAMIDE 1 % OP SOLN
1.0000 [drp] | OPHTHALMIC | Status: AC | PRN
  Administered 2024-05-08 (×3): 1 [drp] via OPHTHALMIC

## 2024-05-08 MED ORDER — LIDOCAINE HCL 3.5 % OP GEL
1.0000 | Freq: Once | OPHTHALMIC | Status: AC
  Administered 2024-05-08: 1 via OPHTHALMIC

## 2024-05-08 MED ORDER — SODIUM CHLORIDE 0.9% FLUSH
INTRAVENOUS | Status: DC | PRN
  Administered 2024-05-08: 5 mL via INTRAVENOUS

## 2024-05-08 MED ORDER — PHENYLEPHRINE HCL 2.5 % OP SOLN
1.0000 [drp] | OPHTHALMIC | Status: AC | PRN
  Administered 2024-05-08 (×3): 1 [drp] via OPHTHALMIC

## 2024-05-08 MED ORDER — POVIDONE-IODINE 5 % OP SOLN
OPHTHALMIC | Status: DC | PRN
  Administered 2024-05-08: 1 via OPHTHALMIC

## 2024-05-08 MED ORDER — PHENYLEPHRINE-KETOROLAC 1-0.3 % IO SOLN
INTRAOCULAR | Status: DC | PRN
  Administered 2024-05-08: 500 mL via OPHTHALMIC

## 2024-05-08 MED ORDER — BSS IO SOLN
INTRAOCULAR | Status: DC | PRN
Start: 2024-05-08 — End: 2024-05-08
  Administered 2024-05-08: 15 mL via INTRAOCULAR

## 2024-05-08 MED ORDER — MIDAZOLAM HCL 2 MG/2ML IJ SOLN
INTRAMUSCULAR | Status: AC
  Filled 2024-05-08: qty 2

## 2024-05-08 MED ORDER — LIDOCAINE HCL (PF) 1 % IJ SOLN
INTRAMUSCULAR | Status: DC | PRN
Start: 2024-05-08 — End: 2024-05-08
  Administered 2024-05-08: 1 mL

## 2024-05-08 MED ORDER — LACTATED RINGERS IV SOLN: INTRAVENOUS | Status: DC

## 2024-05-08 MED ORDER — DEXAMETHASONE 0.4 MG OP INST
VAGINAL_INSERT | OPHTHALMIC | Status: AC
  Filled 2024-05-08: qty 1

## 2024-05-08 MED ORDER — MOXIFLOXACIN HCL 5 MG/ML IO SOLN
INTRAOCULAR | Status: DC | PRN
  Administered 2024-05-08: .2 mL via INTRACAMERAL

## 2024-05-08 SURGICAL SUPPLY — 14 items
CATARACT SUITE SIGHTPATH (MISCELLANEOUS) ×2 IMPLANT
CLOTH BEACON ORANGE TIMEOUT ST (SAFETY) ×2 IMPLANT
DRSG TEGADERM 4X4.75 (GAUZE/BANDAGES/DRESSINGS) ×2 IMPLANT
EYE SHIELD UNIVERSAL CLEAR (GAUZE/BANDAGES/DRESSINGS) IMPLANT
FEE CATARACT SUITE SIGHTPATH (MISCELLANEOUS) ×2 IMPLANT
GLOVE BIOGEL PI IND STRL 7.0 (GLOVE) ×4 IMPLANT
LENS IOL TECNIS EYHANCE 21.5 (Intraocular Lens) IMPLANT
NDL HYPO 18GX1.5 BLUNT FILL (NEEDLE) ×2 IMPLANT
NEEDLE HYPO 18GX1.5 BLUNT FILL (NEEDLE) ×2 IMPLANT
PAD ARMBOARD POSITIONER FOAM (MISCELLANEOUS) ×2 IMPLANT
POSITIONER HEAD 8X9X4 ADT (SOFTGOODS) ×2 IMPLANT
SYR TB 1ML LL NO SAFETY (SYRINGE) ×2 IMPLANT
TAPE SURG TRANSPORE 1 IN (GAUZE/BANDAGES/DRESSINGS) IMPLANT
WATER STERILE IRR 250ML POUR (IV SOLUTION) ×2 IMPLANT

## 2024-05-08 NOTE — Op Note (Signed)
 Date of procedure: 05/08/24  Pre-operative diagnosis: Visually significant age-related nuclear cataract, Left Eye (H25.12)  Post-operative diagnosis: Visually significant age-related nuclear cataract, Left Eye H25.12; Ocular Pain and Inflammation, Left eye H57.12  Procedure: Removal of cataract via phacoemulsification and insertion of intra-ocular lens J&J DIB00 +21.5D into the capsular bag of the Left Eye, Dextenza  Implantation into left lower punctum CPT [id number]  Attending surgeon: Marsa JINNY Cleverly, MD  Anesthesia: MAC, Topical Akten  Complications: None  Estimated Blood Loss: <60mL (minimal)  Specimens: None  Implants:  Implant Name Type Inv. Item Serial No. Manufacturer Lot No. LRB No. Used Action  LENS IOL TECNIS EYHANCE 21.5 - [id number] Intraocular Lens LENS IOL TECNIS EYHANCE 21.5 [id number] [employer]  Left 1 Implanted    Indications:  Visually significant age-related cataract, Left Eye  Procedure:  The patient was seen and identified in the pre-operative area. The operative eye was identified and dilated.  The operative eye was marked.  Topical anesthesia was administered to the operative eye.     The patient was then to the operative suite and placed in the supine position.  A timeout was performed confirming the patient, procedure to be performed, and all other relevant information.   The patient's face was prepped and draped in the usual fashion for intra-ocular surgery.  A lid speculum was placed into the operative eye and the surgical microscope moved into place and focused.  An inferotemporal paracentesis was created using a 20 gauge paracentesis blade.  BSS mixed with [place] , followed by 1% lidocaine  was injected into the anterior chamber.  Viscoelastic was injected into the anterior chamber.  A temporal clear-corneal main wound incision was created using a 2.56mm microkeratome.  A continuous curvilinear capsulorrhexis was initiated using an irrigating cystitome and  completed using capsulorrhexis forceps.  Hydrodissection and hydrodeliniation were performed.  Viscoelastic was injected into the anterior chamber.  A phacoemulsification handpiece and a chopper as a second instrument were used to remove the nucleus and epinucleus. The irrigation/aspiration handpiece was used to remove any remaining cortical material.   The capsular bag was reinflated with viscoelastic, checked, and found to be intact.  The intraocular lens was inserted into the capsular bag.  The irrigation/aspiration handpiece was used to remove any remaining viscoelastic.  The clear corneal wound and paracentesis wounds were then hydrated and checked with Weck-Cels to be watertight. Moxifloxacin  was instilled into the anterior chamber.  The lid-speculum and drape were removed. The lower punctum was dilated, and the dextenza  implant was inserted into it. The patient's face was cleaned with a wet and dry 4x4.  A clear shield was taped over the eye. The patient was taken to the post-operative care unit in good condition, having tolerated the procedure well.  Post-Op Instructions: The patient will follow up at [facility] for a same day post-operative evaluation and will receive all other orders and instructions.

## 2024-05-08 NOTE — Transfer of Care (Signed)
 Immediate Anesthesia Transfer of Care Note  Patient: Justin [name]  Procedure(s) Performed: PHACOEMULSIFICATION, CATARACT, WITH IOL INSERTION (Left: Eye) INSERTION, STENT, DRUG-ELUTING, LACRIMAL CANALICULUS (Left)  Patient Location: PACU  Anesthesia Type:MAC  Level of Consciousness: awake, alert , oriented, and patient cooperative  Airway & Oxygen Therapy: Patient Spontanous Breathing  Post-op Assessment: Report given to RN, Post -op Vital signs reviewed and stable, and Patient moving all extremities X 4  Post vital signs: Reviewed and stable  Last Vitals:  Vitals Value Taken Time  BP 147/98   Temp 98.6   Pulse 81   Resp 17   SpO2 100     Last Pain:  Vitals:   05/08/24 1048  PainSc: 0-No pain         Complications: No notable events documented.

## 2024-05-08 NOTE — Discharge Instructions (Signed)
 Please discharge patient when stable, will follow up today with Dr. Ilsa Iha at the [facility] office immediately following discharge.  Leave shield in place until visit.  All paperwork with discharge instructions will be given at the office.  [facility] Address:  [address]  [place], [place] [address]  Dr. Chaya Jan Phone: [phone]

## 2024-05-08 NOTE — Anesthesia Preprocedure Evaluation (Signed)
 Anesthesia Evaluation  Patient identified by MRN, date of birth, ID band Patient awake    Reviewed: Allergy & Precautions, H&P , NPO status , Patient's Chart, lab work & pertinent test results, reviewed documented beta blocker date and time   Airway Mallampati: II  TM Distance: >3 FB Neck ROM: full    Dental no notable dental hx.    Pulmonary neg pulmonary ROS   Pulmonary exam normal breath sounds clear to auscultation       Cardiovascular Exercise Tolerance: Good hypertension, negative cardio ROS  Rhythm:regular Rate:Normal     Neuro/Psych TIA Neuromuscular disease CVA  negative psych ROS   GI/Hepatic negative GI ROS, Neg liver ROS,,,  Endo/Other  negative endocrine ROS    Renal/GU negative Renal ROS  negative genitourinary   Musculoskeletal   Abdominal   Peds  Hematology negative hematology ROS (+)   Anesthesia Other Findings   Reproductive/Obstetrics negative OB ROS                              Anesthesia Physical Anesthesia Plan  ASA: 3  Anesthesia Plan: MAC   Post-op Pain Management:    Induction:   PONV Risk Score and Plan:   Airway Management Planned:   Additional Equipment:   Intra-op Plan:   Post-operative Plan:   Informed Consent: I have reviewed the patients History and Physical, chart, labs and discussed the procedure including the risks, benefits and alternatives for the proposed anesthesia with the patient or authorized representative who has indicated his/her understanding and acceptance.     Dental Advisory Given  Plan Discussed with: CRNA  Anesthesia Plan Comments:         Anesthesia Quick Evaluation

## 2024-05-09 NOTE — Anesthesia Postprocedure Evaluation (Signed)
 Anesthesia Post Note  Patient: Justin [name]  Procedure(s) Performed: PHACOEMULSIFICATION, CATARACT, WITH IOL INSERTION (Left: Eye) INSERTION, STENT, DRUG-ELUTING, LACRIMAL CANALICULUS (Left)  Patient location during evaluation: Phase II Anesthesia Type: MAC Level of consciousness: awake Pain management: pain level controlled Vital Signs Assessment: post-procedure vital signs reviewed and stable Respiratory status: spontaneous breathing and respiratory function stable Cardiovascular status: blood pressure returned to baseline and stable Postop Assessment: no headache and no apparent nausea or vomiting Anesthetic complications: no Comments: Late entry   No notable events documented.   Last Vitals:  Vitals:   05/08/24 1115 05/08/24 1207  BP:  (!) 147/98  Pulse: 64 84  Resp: 12 13  Temp:  37 C  SpO2: 100% 100%    Last Pain:  Vitals:   05/08/24 1207  TempSrc: Oral  PainSc:                  Yvonna JINNY Bosworth

## 2024-05-18 ENCOUNTER — Encounter (HOSPITAL_COMMUNITY): Payer: Self-pay | Admitting: Optometry

## 2024-07-07 DIAGNOSIS — Z1283 Encounter for screening for malignant neoplasm of skin: Secondary | ICD-10-CM | POA: Diagnosis not present

## 2024-07-07 DIAGNOSIS — D225 Melanocytic nevi of trunk: Secondary | ICD-10-CM | POA: Diagnosis not present
